# Patient Record
Sex: Male | Born: 1955
Health system: Southern US, Community
[De-identification: ages and names within clinical notes are randomized; demographics above are authoritative.]

## PROBLEM LIST (undated history)

## (undated) DIAGNOSIS — C61 Malignant neoplasm of prostate: Secondary | ICD-10-CM

## (undated) DIAGNOSIS — E785 Hyperlipidemia, unspecified: Secondary | ICD-10-CM

## (undated) DIAGNOSIS — M1990 Unspecified osteoarthritis, unspecified site: Secondary | ICD-10-CM

## (undated) DIAGNOSIS — M4800 Spinal stenosis, site unspecified: Secondary | ICD-10-CM

## (undated) DIAGNOSIS — R972 Elevated prostate specific antigen [PSA]: Secondary | ICD-10-CM

## (undated) DIAGNOSIS — E119 Type 2 diabetes mellitus without complications: Secondary | ICD-10-CM

## (undated) DIAGNOSIS — I10 Essential (primary) hypertension: Secondary | ICD-10-CM

## (undated) DIAGNOSIS — G4733 Obstructive sleep apnea (adult) (pediatric): Secondary | ICD-10-CM

## (undated) HISTORY — PX: KNEE SURGERY: SHX244

## (undated) HISTORY — DX: Unspecified osteoarthritis, unspecified site: M19.90

## (undated) HISTORY — DX: Type 2 diabetes mellitus without complications: E11.9

## (undated) HISTORY — PX: SPINAL CORD DECOMPRESSION: SHX97

## (undated) HISTORY — DX: Elevated prostate specific antigen (PSA): R97.20

## (undated) HISTORY — PX: PILONIDAL CYST EXCISION: SHX744

## (undated) HISTORY — DX: Malignant neoplasm of prostate: C61

## (undated) HISTORY — DX: Obstructive sleep apnea (adult) (pediatric): G47.33

## (undated) HISTORY — DX: Essential (primary) hypertension: I10

## (undated) HISTORY — PX: TONSILLECTOMY: SUR1361

## (undated) HISTORY — DX: Hyperlipidemia, unspecified: E78.5

---

## 2006-10-25 ENCOUNTER — Ambulatory Visit: Payer: Self-pay | Admitting: Internal Medicine

## 2006-12-05 ENCOUNTER — Encounter: Payer: Self-pay | Admitting: Internal Medicine

## 2007-01-17 ENCOUNTER — Ambulatory Visit: Payer: Self-pay | Admitting: Internal Medicine

## 2007-02-18 ENCOUNTER — Ambulatory Visit: Payer: Self-pay | Admitting: Internal Medicine

## 2007-02-28 ENCOUNTER — Ambulatory Visit (HOSPITAL_BASED_OUTPATIENT_CLINIC_OR_DEPARTMENT_OTHER): Admission: RE | Admit: 2007-02-28 | Discharge: 2007-02-28 | Payer: Self-pay | Admitting: Internal Medicine

## 2007-02-28 ENCOUNTER — Encounter: Payer: Self-pay | Admitting: Internal Medicine

## 2007-03-08 ENCOUNTER — Ambulatory Visit: Payer: Self-pay | Admitting: Pulmonary Disease

## 2007-03-13 ENCOUNTER — Ambulatory Visit: Payer: Self-pay | Admitting: Pulmonary Disease

## 2007-05-02 DIAGNOSIS — E785 Hyperlipidemia, unspecified: Secondary | ICD-10-CM

## 2007-05-02 DIAGNOSIS — I10 Essential (primary) hypertension: Secondary | ICD-10-CM | POA: Insufficient documentation

## 2007-05-02 DIAGNOSIS — G4733 Obstructive sleep apnea (adult) (pediatric): Secondary | ICD-10-CM | POA: Insufficient documentation

## 2007-05-03 ENCOUNTER — Ambulatory Visit: Payer: Self-pay | Admitting: Pulmonary Disease

## 2010-04-24 HISTORY — PX: OTHER SURGICAL HISTORY: SHX169

## 2010-07-28 ENCOUNTER — Ambulatory Visit: Payer: Commercial Indemnity | Attending: Internal Medicine | Admitting: Physical Therapy

## 2010-07-28 DIAGNOSIS — R262 Difficulty in walking, not elsewhere classified: Secondary | ICD-10-CM | POA: Insufficient documentation

## 2010-07-28 DIAGNOSIS — R5381 Other malaise: Secondary | ICD-10-CM | POA: Insufficient documentation

## 2010-07-28 DIAGNOSIS — IMO0001 Reserved for inherently not codable concepts without codable children: Secondary | ICD-10-CM | POA: Insufficient documentation

## 2010-07-28 DIAGNOSIS — M6281 Muscle weakness (generalized): Secondary | ICD-10-CM | POA: Insufficient documentation

## 2010-07-28 DIAGNOSIS — M25569 Pain in unspecified knee: Secondary | ICD-10-CM | POA: Insufficient documentation

## 2010-08-10 ENCOUNTER — Ambulatory Visit: Payer: Commercial Indemnity | Admitting: Physical Therapy

## 2010-08-12 ENCOUNTER — Ambulatory Visit: Payer: Commercial Indemnity | Admitting: Physical Therapy

## 2010-08-16 ENCOUNTER — Ambulatory Visit: Payer: Commercial Indemnity | Admitting: Physical Therapy

## 2010-08-22 ENCOUNTER — Ambulatory Visit: Payer: Commercial Indemnity | Admitting: Rehabilitation

## 2010-08-24 ENCOUNTER — Ambulatory Visit: Payer: Commercial Indemnity | Attending: Internal Medicine | Admitting: Physical Therapy

## 2010-08-24 DIAGNOSIS — M6281 Muscle weakness (generalized): Secondary | ICD-10-CM | POA: Insufficient documentation

## 2010-08-24 DIAGNOSIS — M25569 Pain in unspecified knee: Secondary | ICD-10-CM | POA: Insufficient documentation

## 2010-08-24 DIAGNOSIS — R5381 Other malaise: Secondary | ICD-10-CM | POA: Insufficient documentation

## 2010-08-24 DIAGNOSIS — R262 Difficulty in walking, not elsewhere classified: Secondary | ICD-10-CM | POA: Insufficient documentation

## 2010-08-24 DIAGNOSIS — IMO0001 Reserved for inherently not codable concepts without codable children: Secondary | ICD-10-CM | POA: Insufficient documentation

## 2010-08-26 ENCOUNTER — Ambulatory Visit: Payer: Commercial Indemnity | Admitting: Physical Therapy

## 2010-08-29 ENCOUNTER — Ambulatory Visit: Payer: Commercial Indemnity | Admitting: Physical Therapy

## 2010-08-31 ENCOUNTER — Ambulatory Visit: Payer: Commercial Indemnity | Admitting: Physical Therapy

## 2010-09-02 ENCOUNTER — Ambulatory Visit: Payer: Commercial Indemnity | Admitting: Physical Therapy

## 2010-09-05 ENCOUNTER — Ambulatory Visit: Payer: Commercial Indemnity | Admitting: Physical Therapy

## 2010-09-06 NOTE — Assessment & Plan Note (Signed)
[facility]                             PULMONARY OFFICE NOTE   NAME:Daniel [name], Daniel [name]                       MRN:          [id number]  DATE:02/18/2007                            DOB:          Aug 27, 1955    A 55 year old black male with morbid obesity, seen at Dr. Hoyle Barr  request for evaluation of dyspnea with exertion dating back several  years associated with nocturnal desaturation that was previously  documented and associated with sleep apnea by polysomnography on December 05, 2006.  Two weeks ago the patient was placed on a CPAP device at 10  cm without oxygen, is feeling like he sleeping well now and has more  energy.  He has not had any significant success in terms of weight loss  however.  Presently he says he is mostly shortness of breath when he  goes up a flight of steps or up inclines but does okay on flat surface.  He denies any orthopnea or significant leg swelling.  His medications  are reviewed with him in detail in column dated February 18, 2007.   PHYSICAL EXAMINATION:  He is a pleasant, ambulatory, obese black male in  no acute distress.  Afebrile vital signs with a weight of 406 pounds, he  is about 200 pounds over ideal body weight and up 6 pounds from previous  visit, blood pressure 130/76.  HEENT:  Unremarkable, oropharynx clear.  LUNGS:  Fields perfectly clear bilaterally to auscultation and  percussion, breath sounds are diminished.  Regular rhythm without murmur, gallop, rub.  ABDOMEN:  Soft, benign.  EXTREMITIES:  Warm without calf tenderness or cyanosis.   PFTs reviewed with patient today and are normal in terms of expiratory  flow.  He does have classic inspiratory truncation that is typical of  sleep apnea patient and also disproportionate reduction in expiratory  reserve volume typical of obesity.   IMPRESSION:  Morbid obesity is this patient primary pulmonary problem.  It results in significant nocturnal desaturation  most likely to be an  obstructive sleep apnea mechanism.  Using the trust but verify method  however I am going to recommend a continuous positive airway pressure  titration study be performed and be sure that the amount of continuous  positive airway pressure necessary to eliminate the obstruction also  eliminates all desaturation.  Otherwise we will need to place him on  nocturnal oxygen as well.  Long term I believe he will need followup by  one of our sleep apnea doctors as he does not appear to have a primary  pulmonary problem that would need to be followed here.   I spent extra time with the patient going over his pulmonary function  tests with him emphasizing that all of the effects that are seen are  related to obesity and are therefore under his control in terms of being  reversible long term with better understanding of calorie balance.  I  spent extra time also talking about calorie balance issues with him  today to help him understand that he  should target exercise that makes him short of  breath but not out of  breath at a minimum of 30 minutes daily to help improve calorie balance  and turn his long term weight loss issues around.     Charlaine Dalton. Sherene Sires, MD, [facility]  Electronically Signed    MBW/MedQ  DD: 02/18/2007  DT: 02/18/2007  Job #: [id number]   cc:   Jackie Plum, M.D.

## 2010-09-06 NOTE — Assessment & Plan Note (Signed)
[facility]                             PULMONARY OFFICE NOTE   NAME:[name], [name]                       MRN:          [id number]  DATE:10/25/2006                            DOB:          [birth year]    REFERRING PHYSICIAN:  Jackie Plum, M.D.   REASON FOR CONSULTATION:  Restrictive lung disease.   HISTORY:  This is a 55 year old black male with dyspnea on exertion  dating back years and proportionate to progressive weight gain up to a  all time high weight of around 400 pounds now.  He states he is really  only short of breath, though, when he goes up a flight of steps.  When  he tries to walk on a flat surface for any length of time his knees give  out before his breathing does.  He is also bothered by mild  hypersomnolence and has a sleep study pending.  His main complaint that  brought him to medical attention was actually leg swelling for which he  has undergone an extensive workup, which showed no evidence of clotting  or heart disease per the patient's report.  Note, there has been no  variability in terms of dyspnea with exertion or leg swelling with any  obvious triggering or alleviating factors.   PAST MEDICAL HISTORY:  The past medical history is significant for  diabetes and degenerative arthritis.   ALLERGIES:  None known.   MEDICATIONS:  The medications are reviewed in detail.  The medications  dated October 25, 2006; significant factor is that he is on no pulmonary  medications.   SOCIAL HISTORY:  The patient never smoked.  He works in Geologist, engineering.   FAMILY HISTORY:  The family history is negative for respiratory  diseases.  Positive for heart disease.  No atypia.   REVIEW OF SYSTEMS:  The patient review of systems technically on the  worksheet is negative, except as outlined above.   PHYSICAL EXAMINATION:  GENERAL APPEARANCE:  On physical examination this  is a very pleasant, morbidly obese black male in acute  distress.  VITAL SIGNS:  The patient has stable vital signs.  Weight of 400 pounds.  HEENT:  The head, eyes, ears, nose and throat are unremarkable.  Pharynx  is clear.  Dentition is intact.  Oral airway is adequate.  NECK:  The neck is supple without cervical adenopathy or tenderness.  The trachea is midline.  LUNGS:  The lung fields reveal diminished breath sounds bilaterally.  No  wheezing.  HEART:  The heart has a regular rhythm without murmur, gallop or rub.  Distant S1 and S2.  No definite increase in P2.  ABDOMEN:  The abdomen is massively obese, but it is benign with normal  excursions in the supine position.  EXTREMITIES:  The extremities reveal a little bit of calf tenderness.  No cyanosis or clubbing.  There is trace edema.   LABORATORY DATA:  The patient has not had a recent chest x-ray.  PFTs  suggest restrictive change, but are not available.   IMPRESSION:  1. Morbid obesity.  It is  not clear that this patient has a primary      pulmonary problem.  The fact that he is in short of breath when      going up two flights of steps considering he is carrying several      extra hundred pounds up those two flights of steps.  I encouraged      him along the lines of understanding that it is very unlikely      anyone, even in exceptional health, could not presently carry 200      extra pounds up a flight steps.   In that context he needs to understand calorie balance and I spent extra  time talking to him about this as well as the ideal target weight of 225  pounds, which I am sure Dr. Julio Sicks can reinforce.   In the meantime there are several major concerns about such massive  obesity.  First, of course, is the risk  of deep venous thrombosis with  pulmonary problems adding to the matter, some atherosclerotic disease  and obesity hypoventilation syndrome.  In addition the indirect affects  of reflux might be considered, but presently do not appear to apply to  this  patient.  Of immediate concern is trying to sort out whether all  the restrictive changes on the pulmonary function tests are due to  obesity. A full set of lung functions, which will include an expiratory  reserve volume should shed light on this and has been scheduled along  with a chest x-ray for six weeks.     Charlaine Dalton. Sherene Sires, MD, [facility]  Electronically Signed    MBW/MedQ  DD: 10/25/2006  DT: 10/26/2006  Job #: [id number]   cc:   Jackie Plum, M.D.

## 2010-09-06 NOTE — Assessment & Plan Note (Signed)
[facility]                             PULMONARY OFFICE NOTE   NAME:Woodside, DEWAIN                       MRN:          [id number]  DATE:03/13/2007                            DOB:          07/06/1955    SLEEP CONSULTATION   HISTORY OF PRESENT ILLNESS:  The patient is a 55 year old gentleman whom  I have been asked to see for management of obstructive sleep apnea. The  patient underwent nocturnal polysomnography in August of this year where  he was found to have severe sleep apnea with apnea hypopnea index of 88  events per hours and O2 desaturation as low as 65%. The patient was  placed on a CPAP machine in October and has been using a full face mask  with heated humidity. The patient has subsequently undergone pressure  optimization where he was found to have optimal CPAP at 18 with a large  Resiment Quattro full face mask. The patient states that he definitely  sees the difference with the CPAP and sleeps much better with increased  daytime alertness. He is currently going to bed between 11 and 2 and  getting up between 5:30 and 6:15. He feels that he sleeps much better.   PAST MEDICAL HISTORY:  1. Hypertension.  2. Diabetes.  3. Dyslipidemia.  4. History of sleep apnea as stated above.   CURRENT MEDICATIONS:  1. Hyzaar 100/25 daily.  2. Avandamet 07/998 daily.  3. Furosemide 20 mg daily.  4. Simvastatin 40 mg daily.  5. Glipizide ER 5 mg daily.  6. Aleve p.r.n.   ALLERGIES:  The patient has no known drug allergies.   SOCIAL HISTORY:  He is married and has children. He has a history of  smoking two packs per week for 5-10 years. He has not smoked in 20  years.   FAMILY HISTORY:  Is remarkable for his mother having a myocardial  infarction/coronary disease. Otherwise, noncontributory in first-degree  relatives.   REVIEW OF SYSTEMS:  As per History of Present Illness. Also, see the  patient intake form documented in the chart.   PHYSICAL EXAMINATION:  In general, he is a morbidly obese male in no  acute distress. Blood pressure 138/74, pulse 73, temperature 98.7,  weight 410 pounds. He is 6 feet, 2 inches tall. O2 saturation on room  air is 97%.  HEENT: Pupils equal, round and reactive to light and accommodation.  Extraocular muscles are intact. Nares: Are patent with turbinate  hypertrophy. Oropharynx does show elongation of the soft palate and  uvula.  NECK: Supple without JVD or lymphadenopathy. There is no palpable  thyromegaly.  CHEST: Is totally clear.  CARDIAC: Reveals regular rate and rhythm. No murmurs, rubs or gallops.  ABDOMEN: Soft and nontender with good bowel sounds.  GENITAL, RECTAL, BREASTS: Was not done and not indicated.  LOWER EXTREMITIES: Shows 1+ edema bilaterally. Pulses are intact  distally.  NEUROLOGIC: Alert and oriented and without obvious motor deficits.   IMPRESSION:  Severe obstructive sleep apnea. The patient has been  started on CPAP and seems to have adjusted quite well. He has  already  seen a difference even at subtherapeutic pressures. At this point in  time, I think we need to get him up to his optimal pressure of 18 cm  slowly and see how he responds. Also, since the patient has had  significant O2 desaturation during his titration, we need to make sure  that he does not need ongoing oxygen with his CPAP.   PLAN:  1. Increase CPAP to 14 cm for three weeks followed by 18 cm for the      next three weeks. He will see me at the end of that time.  2. At the next visit, will order overnight oxymetry on 18-cm of CPAP      with room air.  3. The patient is encouraged to work aggressively on weight loss.     Barbaraann Share, MD,FCCP  Electronically Signed    KMC/MedQ  DD: 03/28/2007  DT: 03/28/2007  Job #: [id number]   cc:   Charlaine Dalton. Sherene Sires, MD, FCCP

## 2010-09-06 NOTE — Procedures (Signed)
NAME:  Daniel [name], Daniel [name]              ACCOUNT NO.:  [id number]   MEDICAL RECORD NO.:  [id number]          PATIENT TYPE:  OUT   LOCATION:  SLEEP CENTER                 FACILITY:  [facility]   PHYSICIAN:  Barbaraann Share, MD,FCCPDATE OF BIRTH:  12-14-54   DATE OF STUDY:  02/28/2007                            NOCTURNAL POLYSOMNOGRAM   REFERRING PHYSICIAN:  Charlaine Dalton. Wert, MD, FCCP   INDICATION FOR STUDY:  Hypersomnia with sleep apnea.  The patient has  been diagnosed with severe obstructive sleep apnea and returns for  pressure optimization.   EPWORTH SLEEPINESS SCORE:  6   MEDICATIONS:   SLEEP ARCHITECTURE:  The patient had a total sleep time of 310 minutes  with no slow wave sleep and only 58 minutes of REM.  Sleep onset latency  was very rapid and REM onset was normal.  Sleep efficiency was decreased  at 79%.   RESPIRATORY DATA:  The patient underwent CPAP titration with a large  ResMed Quattro full face mask.  The patient was placed on CPAP at the  beginning of the study and ultimately increased to a pressure of 19-  cmH2O.  At this level, there were significant central apneas noted and  therefore, I would recommend a treatment pressure of 18-cm.  Tolerance  appeared to be good.   OXYGEN DATA:  There was transient O2 desaturation as low as 78% prior to  optimal CPAP.   CARDIAC DATA:  No clinically significant arrhythmias were noted.   MOVEMENT-PARASOMNIA:  None.   IMPRESSIONS-RECOMMENDATIONS:  Good control of previous documented  obstructive sleep apnea with a CPAP pressure of 18-cmH2O delivered by a  large ResMed Quattro full face mask.  The patient should also be  encouraged to work aggressively on weight loss.     Barbaraann Share, MD,FCCP  Diplomate, American Board of Sleep  Medicine  Electronically Signed    KMC/MEDQ  D:  03/09/2007 13:29:18  T:  03/10/2007 18:84:16  Job:  [id number]

## 2010-09-06 NOTE — Assessment & Plan Note (Signed)
[facility]                             PULMONARY OFFICE NOTE   NAME:Daniel [name], Daniel [name]                       MRN:          [id number]  DATE:01/17/2007                            DOB:          Jun 23, 1955    HISTORY:  55 year old black male with morbid obesity and unexplained  dyspnea who is scheduled for followup PFTs in October but in the  meantime comes in with an overnight sleep study indicating that he has  severe sleep apnea and wanted to be started on CPAP.  He does wake up  sometimes with headaches, frequently not feeling rested, but he denies  any excessive daytime hypersomnolence.   PHYSICAL EXAMINATION:  GENERAL:  He is an obese, ambulatory black male  in no acute distress.  VITAL SIGNS:  Stable.  HEENT:  Unremarkable.  Oropharynx clear.  LUNGS:  Lung fields are completely clear bilaterally to auscultation and  percussion, although breath sounds are diminished.  CARDIOVASCULAR:  Regular rate and rhythm without murmurs, gallops, or  rubs.  ABDOMEN:  Soft, benign.  EXTREMITIES:  Without calf tenderness, cyanosis, clubbing, or edema.   Sleep study was reviewed from December 05, 2006 indicating an AHI of 87.8  with no evidence of significant daytime sleepiness but significant  desaturations that occurred with a saturation as low as 65% during his  obstructive episodes.   IMPRESSION:  Severe sleep apnea with also nocturnal desaturation, all  related to morbid obesity.  I explained the pathophysiology of sleep  apnea to him and recommended that he be tried on 10 cm continuous  positive airway pressure between now and his next visit.  At that time,  he will need to be restudied for continuous positive airway pressure  titration and to make sure that the desaturation that was documented has  resolved without the need for supplemental oxygen.  Much of this, of  course, will depend on his progress with weight loss in the meantime.     Charlaine Dalton.  Sherene Sires, MD, [facility]  Electronically Signed    MBW/MedQ  DD: 01/17/2007  DT: 01/18/2007  Job #: [id number]   cc:   Jackie Plum, M.D.

## 2010-09-07 ENCOUNTER — Ambulatory Visit: Payer: Commercial Indemnity | Admitting: Physical Therapy

## 2010-09-09 ENCOUNTER — Ambulatory Visit: Payer: Commercial Indemnity | Admitting: Rehabilitation

## 2012-05-25 DIAGNOSIS — C61 Malignant neoplasm of prostate: Secondary | ICD-10-CM

## 2012-05-25 HISTORY — DX: Malignant neoplasm of prostate: C61

## 2012-08-29 DIAGNOSIS — R972 Elevated prostate specific antigen [PSA]: Secondary | ICD-10-CM

## 2012-08-29 HISTORY — DX: Elevated prostate specific antigen (PSA): R97.20

## 2012-08-29 HISTORY — PX: PROSTATE BIOPSY: SHX241

## 2012-09-25 NOTE — Progress Notes (Signed)
GU Location of Tumor / Histology:Adenocarcinoma of prostate  If Prostate Cancer, Gleason Score is (3 + 3) and PSA is (7.22)  Patient presented with elevated PSA on , completed anti-biotic therapy then had biopsy with abnormal cells and then second biopsy revealed abnormal cells and after watchful waiting had MRI.  Biopsies of prostate (if applicable) revealed:right lateral apex, gleason 6 with less than 5% of one core  Past/Anticipated interventions by urology, if WUJ:[id number] biopsy on 12/12/11, 06/04/12 and 08/29/12, MR prostate 07/26/12 reveals right apical zone, small left mid gland lesion and left base.Patient to return to [facility] after Radiation Consultation to discuss decision of treatment.  Past/Anticipated interventions by medical oncology, if GNF:AOZH  Weight changes, if any:No  Bowel/Bladder complaints, if any:No  Nausea/Vomiting, if any:No  Pain issues, if YQM:VHQI  SAFETY ISSUES:  Prior radiation? No  Pacemaker/ICD? No  Possible current pregnancy? No  Is the patient on methotrexate? No  Current Complaints / other details Prostate volume 40.97 Patient here with wife for consultation.has 2 children.Works for city of [employer].

## 2012-09-26 ENCOUNTER — Telehealth: Payer: Self-pay | Admitting: *Deleted

## 2012-09-26 ENCOUNTER — Ambulatory Visit
Admission: RE | Admit: 2012-09-26 | Discharge: 2012-09-26 | Disposition: A | Discharge status: Home / Self Care | Payer: Commercial Indemnity | Source: Ambulatory Visit | Attending: Radiation Oncology | Admitting: Radiation Oncology

## 2012-09-26 ENCOUNTER — Encounter: Payer: Self-pay | Admitting: Radiation Oncology

## 2012-09-26 VITALS — BP 127/76 | HR 80 | Temp 97.7°F | Ht 74.0 in | Wt 353.6 lb

## 2012-09-26 DIAGNOSIS — Z7982 Long term (current) use of aspirin: Secondary | ICD-10-CM | POA: Insufficient documentation

## 2012-09-26 DIAGNOSIS — E669 Obesity, unspecified: Secondary | ICD-10-CM | POA: Insufficient documentation

## 2012-09-26 DIAGNOSIS — E119 Type 2 diabetes mellitus without complications: Secondary | ICD-10-CM | POA: Insufficient documentation

## 2012-09-26 DIAGNOSIS — C61 Malignant neoplasm of prostate: Secondary | ICD-10-CM

## 2012-09-26 DIAGNOSIS — G4733 Obstructive sleep apnea (adult) (pediatric): Secondary | ICD-10-CM | POA: Insufficient documentation

## 2012-09-26 DIAGNOSIS — I10 Essential (primary) hypertension: Secondary | ICD-10-CM | POA: Insufficient documentation

## 2012-09-26 DIAGNOSIS — Z87891 Personal history of nicotine dependence: Secondary | ICD-10-CM | POA: Insufficient documentation

## 2012-09-26 DIAGNOSIS — E785 Hyperlipidemia, unspecified: Secondary | ICD-10-CM | POA: Insufficient documentation

## 2012-09-26 NOTE — Progress Notes (Signed)
Please see the Nurse Progress Note in the MD Initial Consult Encounter for this patient. 

## 2012-09-26 NOTE — Telephone Encounter (Signed)
Called patient to inform of pre-seed appt. For 10-01-12 at 2:30 pm, spoke with patient and he is aware of this appt.

## 2012-09-26 NOTE — Addendum Note (Signed)
Encounter addended by: Maryln Gottron, MD on: 09/26/2012  1:12 PM<BR>     Documentation filed: Notes Section

## 2012-09-26 NOTE — Progress Notes (Addendum)
[facility] Radiation Oncology NEW PATIENT EVALUATION  Name: Daniel [name] MRN: [id number]  Date:   09/26/2012           DOB: April 16, 1956  Status: outpatient   CC: No primary provider on file.  Lindaann Slough, MD    REFERRING PHYSICIAN: Lindaann Slough, MD   DIAGNOSIS: Stage TI C. favorable risk adenocarcinoma prostate   HISTORY OF PRESENT ILLNESS:  Daniel [name] is a 57 y.o. male who is seen today for the courtesy Dr. Brunilda Payor for a possible radiation therapy in the management of his stage TI C. adenocarcinoma prostate. He presented with a PSA of 11.1 and underwent ultrasound-guided biopsies by Dr. Brunilda Payor on 12/12/2011 showing atypical glands within the right lateral base, and also left lateral apex. 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 He elected for close surveillance with a repeat PSA of 6.95 with repeat biopsies on 06/04/2012 showing since 6 (3+3) along the right lateral apex. A staging MRI scan at [facility] showed a 3.0 x 2.9 x 2.3 cm area of right apical peripheral zone involvement. There was capsular retraction along the left seminal vesicle felt to be secondary to post inflammatory scarring.  More recently, his PSA rose to 7.22 with repeat biopsies on 08/29/2012 showing 2 areas of Gleason 6 (3+3) involving 5% of one core from the left lateral apex and less than 5% of one core from the right lateral apex. He is interested in pursuing potentially curative therapy. He declines surgery. He is doing reasonably well from a GU and GI standpoint. His I PSS score is 11. He does have erectile dysfunction. His prostate volume from February of this year was 41 cc. PREVIOUS RADIATION THERAPY: No   PAST MEDICAL HISTORY:  has a past medical history of Prostate cancer; Diabetes mellitus without complication; Hypertension; Hyperlipidemia; Obstructive sleep apnea; Elevated PSA (08/29/2012); and Arthritis.     PAST SURGICAL HISTORY:  Past Surgical History  Procedure  Laterality Date  . Prostate biopsy Bilateral 08/29/2012    gleason 3+3=6  Dr.Marc Nesi  . Tonsillectomy      as child age 83 or 58  . Knee surgery      torn meniscus  . Pilonidal cyst excision      lower spine age 56  . Spinal injection  2012    spinal stenosis     FAMILY HISTORY: His father died from liver/kidney failure at 70. His mother died of a heart attack in her mid 70s. A maternal uncle was diagnosed with prostate cancer in his 16s.  SOCIAL HISTORY:  reports that he quit smoking about 15 years ago. His smoking use included Cigarettes, Pipe, and Cigars. He has a 5 pack-year smoking history. He does not have any smokeless tobacco history on file. He reports that  drinks alcohol. Married, 74 year old son and 68 year old daughter. 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   ALLERGIES: Review of patient's allergies indicates no known allergies.   MEDICATIONS:  Current Outpatient Prescriptions  Medication Sig Dispense Refill  . aspirin (ASPIRIN EC) 81 MG EC tablet Take 81 mg by mouth daily. Swallow whole.      . Canagliflozin (INVOKANA) 300 MG TABS Take by mouth.      . Colesevelam HCl (WELCHOL) 3.75 G PACK Take 3.75 g by mouth.      . furosemide (LASIX) 20 MG tablet Take 20 mg by mouth daily.       Marland Kitchen losartan-hydrochlorothiazide (HYZAAR) 100-25 MG  per tablet Take 1 tablet by mouth daily.      . metFORMIN (GLUCOPHAGE) 500 MG tablet Take 1,000 mg by mouth 2 (two) times daily with a meal. 2  Tablets in the morning and 1 tablet in th pm.      . naproxen sodium (ANAPROX) 220 MG tablet Take 220 mg by mouth 2 (two) times daily with a meal.       No current facility-administered medications for this encounter.     REVIEW OF SYSTEMS:  Pertinent items are noted in HPI.    PHYSICAL EXAM:  height is 6\' 2"  (1.88 m) and weight is 353 lb 9.6 oz (160.392 kg). His temperature is 97.7 F (36.5 C). His blood pressure is 127/76 and his pulse is 80.   Alert and oriented obese  57 year old African American male appearing his stated age. Head and neck examination: Grossly unremarkable. Nodes: Without palpable cervical or supraclavicular lymphadenopathy. Chest: Lungs clear. Back: Without spinal or CVA tenderness. Heart: Regular rate and rhythm. Abdomen: Obese without hepatomegaly. Genitalia: Unremarkable to inspection. Rectal: I am only able to palpate the inferior one half of his gland which is without focal induration or nodularity. Extremities: Trace ankle edema.   LABORATORY DATA:  No results found for this basename: WBC, HGB, HCT, MCV, PLT   No results found for this basename: NA, K, CL, CO2   No results found for this basename: ALT, AST, GGT, ALKPHOS, BILITOT   PSA 7.22 from 08/29/2012    IMPRESSION: Stage TI C. favorable risk adenocarcinoma prostate. I explained to the patient and his wife that his prognosis is related to his stage, PSA level, and Gleason score. He has favorable risk disease. We discussed other prognostic factors which include PSA doubling time and disease volume. These also appear to be favorable. We discussed management options which include surgery versus continued close surveillance, and radiation therapy. Radiation therapy options include seed implantation alone or external beam/IMRT. We discussed the potential acute and late toxicities of radiation therapy. After lengthy discussion he is most interested in seed implantation which I think would be a reasonable choice for him. Will have him return for his CT arch study and then get him scheduled for seed implantation with Dr. Brunilda Payor. Consent is signed today.   PLAN: As discussed above. He was given literature for review. I spent 60 minutes minutes face to face with the patient and more than 50% of that time was spent in counseling and/or coordination of care.

## 2012-09-26 NOTE — Addendum Note (Signed)
Encounter addended by: Maryln Gottron, MD on: 09/26/2012  1:19 PM<BR>     Documentation filed: Notes Section

## 2012-09-30 ENCOUNTER — Telehealth: Payer: Self-pay | Admitting: *Deleted

## 2012-09-30 NOTE — Telephone Encounter (Signed)
Called patient to remind of appt. For 10-01-12, lvm for a return call

## 2012-10-01 ENCOUNTER — Other Ambulatory Visit: Payer: Self-pay | Admitting: Urology

## 2012-10-01 ENCOUNTER — Ambulatory Visit
Admission: RE | Admit: 2012-10-01 | Discharge: 2012-10-01 | Disposition: A | Discharge status: Home / Self Care | Payer: Managed Care, Other (non HMO) | Source: Ambulatory Visit | Attending: Radiation Oncology | Admitting: Radiation Oncology

## 2012-10-01 DIAGNOSIS — C61 Malignant neoplasm of prostate: Secondary | ICD-10-CM | POA: Insufficient documentation

## 2012-10-01 NOTE — Progress Notes (Signed)
   Complex simulation/treatment planning note: The patient was taken to the CT simulator. His pelvis was scanned. The CT data set was sent to the treatment planning computer for contouring of his prostate. I perform a CT arch study. The arch is open with minimal overlap anteriorly. Prostate volume is 41.8 cc with a prostatic length of 4.8 cm. I'm prescribing 14,500 cGy utilizing I-125 seeds. He is to be implanted with [employer] system.

## 2012-10-02 ENCOUNTER — Telehealth: Payer: Self-pay | Admitting: *Deleted

## 2012-10-02 NOTE — Telephone Encounter (Signed)
CALLED PATIENT TO INFORM THAT HIS IMPLANT WILL BE DONE IN THE MAIN OR AT [facility], SPOKE WITH PATIENT AND HE IS AWARE OF THIS

## 2012-10-09 ENCOUNTER — Other Ambulatory Visit: Payer: Self-pay | Admitting: Radiation Oncology

## 2012-10-09 ENCOUNTER — Telehealth: Payer: Self-pay | Admitting: *Deleted

## 2012-10-09 DIAGNOSIS — C61 Malignant neoplasm of prostate: Secondary | ICD-10-CM

## 2012-10-09 NOTE — Telephone Encounter (Signed)
CALLED PATIENT TO ASK QUESTION, LVM FOR A RETURN CALL 

## 2012-10-16 ENCOUNTER — Ambulatory Visit (HOSPITAL_COMMUNITY)
Admission: RE | Admit: 2012-10-16 | Discharge: 2012-10-16 | Disposition: A | Discharge status: Home / Self Care | Payer: Commercial Indemnity | Source: Ambulatory Visit | Attending: Radiation Oncology | Admitting: Radiation Oncology

## 2012-10-16 DIAGNOSIS — IMO0002 Reserved for concepts with insufficient information to code with codable children: Secondary | ICD-10-CM | POA: Insufficient documentation

## 2012-10-16 DIAGNOSIS — C61 Malignant neoplasm of prostate: Secondary | ICD-10-CM | POA: Insufficient documentation

## 2012-10-16 DIAGNOSIS — Z01818 Encounter for other preprocedural examination: Secondary | ICD-10-CM | POA: Insufficient documentation

## 2012-10-16 DIAGNOSIS — E119 Type 2 diabetes mellitus without complications: Secondary | ICD-10-CM | POA: Insufficient documentation

## 2012-10-16 DIAGNOSIS — I10 Essential (primary) hypertension: Secondary | ICD-10-CM | POA: Insufficient documentation

## 2012-10-16 DIAGNOSIS — Z01810 Encounter for preprocedural cardiovascular examination: Secondary | ICD-10-CM | POA: Insufficient documentation

## 2012-11-08 ENCOUNTER — Encounter (HOSPITAL_COMMUNITY): Payer: Self-pay | Admitting: Pharmacy Technician

## 2012-11-13 ENCOUNTER — Encounter (HOSPITAL_COMMUNITY)
Admission: RE | Admit: 2012-11-13 | Discharge: 2012-11-13 | Disposition: A | Discharge status: Home / Self Care | Payer: Managed Care, Other (non HMO) | Source: Ambulatory Visit | Attending: Urology | Admitting: Urology

## 2012-11-13 ENCOUNTER — Encounter (HOSPITAL_COMMUNITY): Payer: Self-pay

## 2012-11-13 HISTORY — DX: Spinal stenosis, site unspecified: M48.00

## 2012-11-13 LAB — CBC
MCH: 30.6 pg (ref 26.0–34.0)
MCV: 90.8 fL (ref 78.0–100.0)
Platelets: 164 10*3/uL (ref 150–400)
RBC: 5 MIL/uL (ref 4.22–5.81)
RDW: 13.1 % (ref 11.5–15.5)

## 2012-11-13 LAB — PROTIME-INR
INR: 0.99 (ref 0.00–1.49)
Prothrombin Time: 12.9 seconds (ref 11.6–15.2)

## 2012-11-13 LAB — COMPREHENSIVE METABOLIC PANEL
AST: 39 U/L — ABNORMAL HIGH (ref 0–37)
CO2: 29 mEq/L (ref 19–32)
Calcium: 9.8 mg/dL (ref 8.4–10.5)
Creatinine, Ser: 0.7 mg/dL (ref 0.50–1.35)
GFR calc non Af Amer: >90 mL/min (ref >90)

## 2012-11-13 NOTE — Patient Instructions (Addendum)
19 Daniel [name]  11/13/2012   Your procedure is scheduled on: 11/19/12  Report to [facility] at 5:15AM.  Call this number if you have problems the morning of surgery 336-: [phone]   Remember: please do not take any diabetic medicine on day of surgery    Do not eat food or drink liquids After Midnight.      Do not wear jewelry, make-up or nail polish.  Do not wear lotions, powders, or perfumes. You may wear deodorant.  Do not shave 48 hours prior to surgery. Men may shave face and neck.  Do not bring valuables to the hospital.  Contacts, dentures or bridgework may not be worn into surgery.    Patients discharged the day of surgery will not be allowed to drive home.  Name and phone number of your driver:[name] (wife) [phone]   [name], RN  pre op nurse call if needed [phone]    FAILURE TO FOLLOW THESE INSTRUCTIONS MAY RESULT IN CANCELLATION OF YOUR SURGERY   Patient Signature: ___________________________________________

## 2012-11-13 NOTE — Progress Notes (Signed)
EKG 10/16/12 on chart- pt refused to have another one. Chest x-ray 10/16/12 on EPIC- pt refused to have another one.

## 2012-11-18 ENCOUNTER — Telehealth: Payer: Self-pay | Admitting: *Deleted

## 2012-11-18 NOTE — Telephone Encounter (Signed)
Called patient to remind of procedure for 11-19-12, confirmed procedure for 11-19-12 with patient.

## 2012-11-18 NOTE — H&P (Signed)
  History and Physical  Chief Complaint: Prostate cancer  History of Present Illness: Daniel [name] has  prostate cancer Gleason 6 diagnosed in August 2013 in 5% of one core at the right apex.  He elected to be on active surveillance.  Repeat biopsy showed 2 cores positive for adenocarcinoma.  He changed his mind about active surveillance.  He saw Dr Dayton Scrape in consultation and he chose to have seeds implantation.  He is now scheduled for the procedure.  Past Medical History  Diagnosis Date  . Diabetes mellitus without complication   . Hypertension   . Hyperlipidemia   . Obstructive sleep apnea     CPAP  . Elevated PSA 08/29/2012    7.22  . Arthritis   . Prostate cancer Feb 2014  . Spinal stenosis    Past Surgical History  Procedure Laterality Date  . Prostate biopsy Bilateral 08/29/2012    gleason 3+3=6  Dr.Draiden Mirsky  . Tonsillectomy      as child age 61 or 13  . Knee surgery      torn meniscus  . Pilonidal cyst excision      lower spine age 89  . Spinal injection  2012    spinal stenosis    Medications: Aleve,Bydureon, Ecotrin, Furosemide, Invokana, Losartan, Metformin, Welchol. Allergies: No Known Allergies  No family history on file. Social History:  reports that he quit smoking about 15 years ago. His smoking use included Cigarettes, Pipe, and Cigars. He has a 5 pack-year smoking history. He has never used smokeless tobacco. He reports that  drinks alcohol. He reports that he does not use illicit drugs.  ROS: All systems are reviewed and negative except as noted.   Physical Exam:  Vital signs in last 24 hours:   HEENT: Normal.  No cervical adenopathy. Cardiovascular: Skin warm; not flushed Respiratory: Breaths quiet; no shortness of breath Abdomen: No masses Neurological: Normal sensation to touch Musculoskeletal: Normal motor function arms and legs Lymphatics: No inguinal adenopathy Skin: No rashes Genitourinary: Penis is normal.  Scrotum is normal in appearance.   Testicles are normal. Rectal: Sphincter tone is normal.  Prostate is enlarged 40 gm.  No nodules.  Seminal vesicles are not palpable.  Laboratory Data:  No results found for this or any previous visit (from the past 24 hour(s)). No results found for this or any previous visit (from the past 240 hour(s)). Creatinine:  Recent Labs  11/13/12 1230  CREATININE 0.70    Xrays: See report/chart   Impression/Assessment:  Adenocarcinoma of prostate stage T1C  Plan:  I125 seeds implantation.  Daniel [name] 11/18/2012, 5:37 PM

## 2012-11-19 ENCOUNTER — Encounter (HOSPITAL_COMMUNITY)
Admission: RE | Disposition: A | Discharge status: Home / Self Care | Payer: Self-pay | Source: Ambulatory Visit | Attending: Urology

## 2012-11-19 ENCOUNTER — Ambulatory Visit (HOSPITAL_COMMUNITY): Payer: Managed Care, Other (non HMO)

## 2012-11-19 ENCOUNTER — Ambulatory Visit (HOSPITAL_COMMUNITY)
Admission: RE | Admit: 2012-11-19 | Discharge: 2012-11-19 | Disposition: A | Discharge status: Home / Self Care | Payer: Managed Care, Other (non HMO) | Source: Ambulatory Visit | Attending: Urology | Admitting: Urology

## 2012-11-19 ENCOUNTER — Encounter: Payer: Self-pay | Admitting: Radiation Oncology

## 2012-11-19 ENCOUNTER — Encounter (HOSPITAL_COMMUNITY): Payer: Self-pay | Admitting: *Deleted

## 2012-11-19 ENCOUNTER — Encounter (HOSPITAL_COMMUNITY): Payer: Self-pay | Admitting: Anesthesiology

## 2012-11-19 ENCOUNTER — Ambulatory Visit (HOSPITAL_COMMUNITY): Payer: Managed Care, Other (non HMO) | Admitting: Anesthesiology

## 2012-11-19 DIAGNOSIS — E119 Type 2 diabetes mellitus without complications: Secondary | ICD-10-CM | POA: Insufficient documentation

## 2012-11-19 DIAGNOSIS — C61 Malignant neoplasm of prostate: Secondary | ICD-10-CM | POA: Insufficient documentation

## 2012-11-19 DIAGNOSIS — E785 Hyperlipidemia, unspecified: Secondary | ICD-10-CM | POA: Insufficient documentation

## 2012-11-19 DIAGNOSIS — G4733 Obstructive sleep apnea (adult) (pediatric): Secondary | ICD-10-CM | POA: Insufficient documentation

## 2012-11-19 DIAGNOSIS — I10 Essential (primary) hypertension: Secondary | ICD-10-CM | POA: Insufficient documentation

## 2012-11-19 HISTORY — PX: RADIOACTIVE SEED IMPLANT: SHX5150

## 2012-11-19 SURGERY — INSERTION, RADIATION SOURCE, PROSTATE
Anesthesia: General | Wound class: Contaminated

## 2012-11-19 MED ORDER — FENTANYL CITRATE 0.05 MG/ML IJ SOLN
INTRAMUSCULAR | Status: AC
  Filled 2012-11-19: qty 2

## 2012-11-19 MED ORDER — HYDROCODONE-ACETAMINOPHEN 5-325 MG PO TABS: 1.0000 | ORAL_TABLET | Freq: Four times a day (QID) | ORAL | Status: DC | PRN

## 2012-11-19 MED ORDER — CIPROFLOXACIN IN D5W 400 MG/200ML IV SOLN
INTRAVENOUS | Status: AC
  Filled 2012-11-19: qty 200

## 2012-11-19 MED ORDER — FENTANYL CITRATE 0.05 MG/ML IJ SOLN
25.0000 ug | INTRAMUSCULAR | Status: DC | PRN
  Administered 2012-11-19 (×3): 50 ug via INTRAVENOUS

## 2012-11-19 MED ORDER — MEPERIDINE HCL 50 MG/ML IJ SOLN: 6.2500 mg | INTRAMUSCULAR | Status: DC | PRN

## 2012-11-19 MED ORDER — FENTANYL CITRATE 0.05 MG/ML IJ SOLN
INTRAMUSCULAR | Status: DC | PRN
  Administered 2012-11-19: 100 ug via INTRAVENOUS
  Administered 2012-11-19 (×2): 50 ug via INTRAVENOUS

## 2012-11-19 MED ORDER — SUCCINYLCHOLINE CHLORIDE 20 MG/ML IJ SOLN
INTRAMUSCULAR | Status: DC | PRN
  Administered 2012-11-19 (×2): 100 mg via INTRAVENOUS

## 2012-11-19 MED ORDER — PROPOFOL 10 MG/ML IV BOLUS
INTRAVENOUS | Status: DC | PRN
  Administered 2012-11-19: 300 mg via INTRAVENOUS
  Administered 2012-11-19: 100 mg via INTRAVENOUS

## 2012-11-19 MED ORDER — NEOSTIGMINE METHYLSULFATE 1 MG/ML IJ SOLN
INTRAMUSCULAR | Status: DC | PRN
  Administered 2012-11-19: 5 mg via INTRAVENOUS

## 2012-11-19 MED ORDER — IOHEXOL 300 MG/ML  SOLN
INTRAMUSCULAR | Status: DC | PRN
  Administered 2012-11-19: 7 mL

## 2012-11-19 MED ORDER — LIDOCAINE HCL (CARDIAC) 20 MG/ML IV SOLN
INTRAVENOUS | Status: DC | PRN
  Administered 2012-11-19: 100 mg via INTRAVENOUS

## 2012-11-19 MED ORDER — MIDAZOLAM HCL 5 MG/5ML IJ SOLN
INTRAMUSCULAR | Status: DC | PRN
  Administered 2012-11-19: 2 mg via INTRAVENOUS

## 2012-11-19 MED ORDER — IOHEXOL 300 MG/ML  SOLN
INTRAMUSCULAR | Status: AC
  Filled 2012-11-19: qty 1

## 2012-11-19 MED ORDER — LACTATED RINGERS IV SOLN
INTRAVENOUS | Status: DC | PRN
  Administered 2012-11-19 (×2): via INTRAVENOUS

## 2012-11-19 MED ORDER — FLEET ENEMA 7-19 GM/118ML RE ENEM: 1.0000 | ENEMA | Freq: Once | RECTAL | Status: DC

## 2012-11-19 MED ORDER — LACTATED RINGERS IV SOLN: INTRAVENOUS | Status: DC

## 2012-11-19 MED ORDER — PROMETHAZINE HCL 25 MG/ML IJ SOLN: 6.2500 mg | INTRAMUSCULAR | Status: DC | PRN

## 2012-11-19 MED ORDER — ONDANSETRON HCL 4 MG/2ML IJ SOLN
INTRAMUSCULAR | Status: DC | PRN
  Administered 2012-11-19: 4 mg via INTRAVENOUS

## 2012-11-19 MED ORDER — STERILE WATER FOR IRRIGATION IR SOLN
Status: DC | PRN
  Administered 2012-11-19: 3000 mL via INTRAVESICAL

## 2012-11-19 MED ORDER — GLYCOPYRROLATE 0.2 MG/ML IJ SOLN
INTRAMUSCULAR | Status: DC | PRN
  Administered 2012-11-19: .8 mg via INTRAVENOUS

## 2012-11-19 MED ORDER — CISATRACURIUM BESYLATE (PF) 10 MG/5ML IV SOLN
INTRAVENOUS | Status: DC | PRN
  Administered 2012-11-19: 4 mg via INTRAVENOUS
  Administered 2012-11-19: 8 mg via INTRAVENOUS
  Administered 2012-11-19: 2 mg via INTRAVENOUS

## 2012-11-19 MED ORDER — CIPROFLOXACIN HCL 500 MG PO TABS: 500.0000 mg | ORAL_TABLET | Freq: Two times a day (BID) | ORAL | Status: DC

## 2012-11-19 MED ORDER — CIPROFLOXACIN IN D5W 400 MG/200ML IV SOLN
400.0000 mg | INTRAVENOUS | Status: AC
  Administered 2012-11-19: 400 mg via INTRAVENOUS

## 2012-11-19 SURGICAL SUPPLY — 18 items
BAG URINE DRAINAGE (UROLOGICAL SUPPLIES) IMPLANT
BLADE SURG ROTATE 9660 (MISCELLANEOUS) IMPLANT
CATH FOLEY 2WAY SLVR  5CC 16FR (CATHETERS) ×1
CATH FOLEY 2WAY SLVR 5CC 16FR (CATHETERS) ×1 IMPLANT
CATH ROBINSON RED A/P 20FR (CATHETERS) ×2 IMPLANT
CLOTH BEACON ORANGE TIMEOUT ST (SAFETY) ×2 IMPLANT
DRAPE BACK TABLE (DRAPES) ×2 IMPLANT
DRSG TEGADERM 4X4.75 (GAUZE/BANDAGES/DRESSINGS) ×2 IMPLANT
DRSG TEGADERM 8X12 (GAUZE/BANDAGES/DRESSINGS) ×2 IMPLANT
GLOVE BIOGEL M 7.0 STRL (GLOVE) ×6 IMPLANT
GOWN STRL NON-REIN LRG LVL3 (GOWN DISPOSABLE) ×2 IMPLANT
HOLDER FOLEY CATH W/STRAP (MISCELLANEOUS) ×2 IMPLANT
PACK CYSTO (CUSTOM PROCEDURE TRAY) ×2 IMPLANT
SYRINGE 10CC LL (SYRINGE) ×4 IMPLANT
TRAY FOLEY BAG SILVER LF 16FR (CATHETERS) ×2 IMPLANT
UNDERPAD 30X30 INCONTINENT (UNDERPADS AND DIAPERS) ×4 IMPLANT
WATER STERILE IRR 1000ML UROMA (IV SOLUTION) IMPLANT
select seed i-125 ×114 IMPLANT

## 2012-11-19 NOTE — Anesthesia Procedure Notes (Addendum)
Procedure Name: Intubation Date/Time: 11/19/2012 7:53 AM Performed by: Leroy Libman L Patient Re-evaluated:Patient Re-evaluated prior to inductionOxygen Delivery Method: Circle system utilized Preoxygenation: Pre-oxygenation with 100% oxygen Intubation Type: IV induction Ventilation: Mask ventilation without difficulty and Oral airway inserted - appropriate to patient size Laryngoscope Size: Mac and 4 Grade View: Grade III Tube type: Oral Tube size: 8.0 mm Number of attempts: 3 Airway Equipment and Method: Bougie stylet Placement Confirmation: ETT inserted through vocal cords under direct vision,  breath sounds checked- equal and bilateral and positive ETCO2 Secured at: 22 cm Tube secured with: Tape Dental Injury: Teeth and Oropharynx as per pre-operative assessment  Difficulty Due To: Difficult Airway- due to reduced neck mobility and Difficult Airway- due to anterior larynx Comments: Attempted with Hyacinth Meeker 3 and only able to see arytenoids.  Mac 4 used by Dr. Acey Lav, slight rightward tracheal pressure and bougie

## 2012-11-19 NOTE — Op Note (Signed)
Daniel [name] is a 57 y.o.   11/19/2012  General  Preop diagnosis: Adenocarcinoma of prostate stage TI C.  Postop diagnosis: Same  Procedure done: I-125 seeds implantation, cystoscopy  Surgeon: Wendie Simmer. Athaliah Baumbach and Dr. Chipper Herb  Anesthesia: General  Indication: Patient is a 57 years old male who was diagnosed with prostate cancer Gleason 6 in August 2013. He was on active surveillance. Repeat biopsy was positive for adenocarcinoma Gleason 6 at the apex. He then elected to have definitive therapy. All options were discussed with him. He saw Dr. Dayton Scrape in consultation and he chose to have seeds implantation. He is scheduled today for the procedure  Procedure: Patient was identified by his wrist band and proper timeout was taken.  Under general anesthesia patient was prepped and draped and placed in the dorsolithotomy position. A Foley catheter was inserted in the bladder. Ultrasound planning was then done by Dr. Dayton Scrape. When planning was completed, under ultrasound guidance and with the Nucletron a total of 57 seeds were implanted in the prostate through 19 needles. The total apparent activity is 27.0750 mCi. Under fluoroscopy there appears to be good seeds distribution. But one seed appears to be in the bladder..  The Foley catheter was then removed. A flexible cystoscope was passed in the bladder. The anterior urethra is normal there is moderate prostatic hypertrophy. There is a blood clot in the bladder and within the blood clot a seed was noted. There is no tumor or stone in the bladder. The ureteral orifices are in normal position and shape. The flexible cystoscope was removed. A rigid cystoscope was inserted in the bladder and  the seed was removed with a grasping forceps.  A #16 French Foley catheter was then reinserted in the bladder.  The patient tolerated the procedure well and left the OR in satisfactory condition to postanesthesia care unit.  EBL: Minimal

## 2012-11-19 NOTE — Anesthesia Preprocedure Evaluation (Addendum)
Anesthesia Evaluation  Patient identified by MRN, date of birth, ID band Patient awake    Reviewed: Allergy & Precautions, H&P , NPO status , Patient's Chart, lab work & pertinent test results  Airway Mallampati: II TM Distance: >3 FB Neck ROM: Full    Dental no notable dental hx. (+) Teeth Intact   Pulmonary sleep apnea and Continuous Positive Airway Pressure Ventilation , former smoker,  breath sounds clear to auscultation  Pulmonary exam normal       Cardiovascular hypertension, Pt. on medications Rhythm:Regular Rate:Normal     Neuro/Psych negative neurological ROS  negative psych ROS   GI/Hepatic negative GI ROS, Neg liver ROS, Elevated LFT's   Endo/Other  diabetes, Type 2, Oral Hypoglycemic AgentsMorbid obesity  Renal/GU negative Renal ROS  negative genitourinary   Musculoskeletal negative musculoskeletal ROS (+)   Abdominal (+) + obese,   Peds negative pediatric ROS (+)  Hematology negative hematology ROS (+)   Anesthesia Other Findings   Reproductive/Obstetrics negative OB ROS                          Anesthesia Physical Anesthesia Plan  ASA: III  Anesthesia Plan: General   Post-op Pain Management:    Induction: Intravenous  Airway Management Planned: Oral ETT  Additional Equipment:   Intra-op Plan:   Post-operative Plan: Extubation in OR  Informed Consent: I have reviewed the patients History and Physical, chart, labs and discussed the procedure including the risks, benefits and alternatives for the proposed anesthesia with the patient or authorized representative who has indicated his/her understanding and acceptance.   Dental advisory given  Plan Discussed with: CRNA  Anesthesia Plan Comments: (Avoid tylenol)       Anesthesia Quick Evaluation

## 2012-11-19 NOTE — Transfer of Care (Signed)
Immediate Anesthesia Transfer of Care Note  Patient: Daniel [name]  Procedure(s) Performed: Procedure(s): RADIOACTIVE SEED IMPLANT (N/A)  Patient Location: PACU  Anesthesia Type:General  Level of Consciousness: awake, alert  and oriented  Airway & Oxygen Therapy: Patient Spontanous Breathing and Patient connected to face mask oxygen  Post-op Assessment: Report given to PACU RN and Post -op Vital signs reviewed and stable  Post vital signs: Reviewed and stable  Complications: No apparent anesthesia complications

## 2012-11-19 NOTE — Progress Notes (Addendum)
CC: Dr. Su Grand   End of treatment summary:  Diagnosis: Stage TI C. favorable risk adenocarcinoma prostate.  Intent: Curative  Requesting physician: Dr. Su Grand  Site/dose: Prostate 14,500 cGy, isotope I-125 utilizing 19 needles to implant 57 seeds . One seed was removed at the time of cystoscopy for a total of 56 seeds implanted. Individual seed activity 0.475 mCi per seed for a total implant activity of 27.1 mCi.  Narrative: Daniel [name] appears to have undergone a successful Nucletron seed Selectron implant with Dr. Brunilda Payor.  Plan: Followup visit with Dr. Brunilda Payor and myself in approximately 3 weeks. We will obtain a CT scan that time for his post implant dosimetry.

## 2012-11-19 NOTE — Progress Notes (Addendum)
[facility] Radiation Oncology Brachytherapy Operative Procedure Note  Name: Daniel [name] MRN: [id number]  Date:   09/30/2012           DOB: Dec 21, 1955  Status:outpatient    WU:[id number],GNFAOZ, MD  Dr. Su Grand DIAGNOSIS: A 57 year old gentlemen with stage T1 C. adenocarcinoma of the prostate with a Gleason of 6 and a PSA of 6.95.  PROCEDURE: Insertion of radioactive I-125 seeds into the prostate gland.  RADIATION DOSE: 145 Gy, definitive therapy.  TECHNIQUE: Daniel [name] was brought to the operating room with Dr. Brunilda Payor. He was placed in the dorsolithotomy position. He was catheterized and a rectal tube was inserted. The perineum was shaved, prepped and draped. The ultrasound probe was then introduced into the rectum to see the prostate gland.  TREATMENT DEVICE: A needle grid was attached to the ultrasound probe stand and anchor needles were placed.  COMPLEX ISODOSE CALCULATION: The prostate was imaged in 3D using a sagittal sweep of the prostate probe. These images were transferred to the planning computer. There, the prostate, urethra and rectum were defined on each axial reconstructed image. Then, the software created an optimized plan and a few seed positions were adjusted. Then the accepted plan was uploaded to the seed Selectron afterloading unit.  SPECIAL TREATMENT PROCEDURE/SUPERVISION AND HANDLING: The Nucletron FIRST system was used to place the needles under sagittal guidance. A total of 19 needles were used to deposit 57 seeds in the prostate gland. One seed was removed at the time of cystoscopy following his implant, thus he had a total of 56 seeds implanted. The individual seed activity was 0.475 mCi for a total implant activity of 27.1 mCi.  COMPLEX SIMULATION: At the end of the procedure, an anterior radiograph of the pelvis was obtained to document seed positioning and count. Cystoscopy was performed to check the urethra and bladder. As mentioned above, once  he was removed from the bladder for a total of 56 seeds implanted.  MICRODOSIMETRY: At the end of the procedure, the patient was emitting 0.02 mrem/hr at 1 meter. Accordingly, he was considered safe for hospital discharge.  PLAN: The patient will return to the radiation oncology clinic for post implant CT dosimetry in three weeks.

## 2012-11-19 NOTE — Preoperative (Signed)
Beta Blockers   Reason not to administer Beta Blockers:Not Applicable 

## 2012-11-19 NOTE — Anesthesia Postprocedure Evaluation (Signed)
  Anesthesia Post-op Note  Patient: Daniel [name]  Procedure(s) Performed: Procedure(s) (LRB): RADIOACTIVE SEED IMPLANT (N/A)  Patient Location: PACU  Anesthesia Type: General  Level of Consciousness: awake and alert   Airway and Oxygen Therapy: Patient Spontanous Breathing  Post-op Pain: mild  Post-op Assessment: Post-op Vital signs reviewed, Patient's Cardiovascular Status Stable, Respiratory Function Stable, Patent Airway and No signs of Nausea or vomiting  Last Vitals:  Filed Vitals:   11/19/12 1227  BP: 153/71  Pulse: 91  Temp: 36.6 C  Resp: 14    Post-op Vital Signs: stable   Complications: No apparent anesthesia complications

## 2012-11-19 NOTE — Progress Notes (Signed)
Used radiation alert inspector instrument to scan urine in foley bag and bed sheets for radioactive seeds. None found. Family instructed what to do if seeds are found.

## 2012-11-20 ENCOUNTER — Encounter (HOSPITAL_COMMUNITY): Payer: Self-pay | Admitting: Urology

## 2012-12-10 ENCOUNTER — Telehealth: Payer: Self-pay | Admitting: *Deleted

## 2012-12-10 NOTE — Telephone Encounter (Signed)
CALLED PATIENT TO REMIND OF APPTS. FOR 12-11-12, LVM FOR A RETURN CALL

## 2012-12-11 ENCOUNTER — Ambulatory Visit
Admit: 2012-12-11 | Discharge: 2012-12-11 | Disposition: A | Discharge status: Home / Self Care | Payer: Managed Care, Other (non HMO) | Attending: Radiation Oncology | Admitting: Radiation Oncology

## 2012-12-11 ENCOUNTER — Ambulatory Visit
Admission: RE | Admit: 2012-12-11 | Discharge: 2012-12-11 | Disposition: A | Discharge status: Home / Self Care | Payer: Managed Care, Other (non HMO) | Source: Ambulatory Visit | Attending: Radiation Oncology | Admitting: Radiation Oncology

## 2012-12-11 ENCOUNTER — Inpatient Hospital Stay
Admission: RE | Admit: 2012-12-11 | Discharge: 2012-12-11 | Disposition: A | Discharge status: Home / Self Care | Payer: Self-pay | Source: Ambulatory Visit | Admitting: Radiation Oncology

## 2012-12-11 ENCOUNTER — Encounter: Payer: Self-pay | Admitting: Radiation Oncology

## 2012-12-11 VITALS — BP 109/70 | HR 89 | Temp 97.5°F | Ht 74.0 in | Wt 349.5 lb

## 2012-12-11 DIAGNOSIS — C61 Malignant neoplasm of prostate: Secondary | ICD-10-CM

## 2012-12-11 NOTE — Progress Notes (Signed)
CC: Dr. Su Grand  Followup note: Mr. Zwilling returns today approximately 3 weeks following prostate seed implantation in the management of his stage TI C. favorable risk adenocarcinoma prostate. He still doing well from a GU and GI standpoint although he does have intermittent slowing of his urinary stream. He denies dysuria. He does have nocturia x1. No GI difficulties. He is to see Dr. Brunilda Payor later today. He is not on an alpha blocker.  Physical examination: Alert and oriented. Wt Readings from Last 3 Encounters:  12/11/12 349 lb 8 oz (158.532 kg)  11/13/12 351 lb (159.213 kg)  05/03/07 410 lb 8 oz (186.202 kg)   Temp Readings from Last 3 Encounters:  12/11/12 97.5 F (36.4 C)   11/19/12 97.9 F (36.6 C) Oral  11/19/12 97.9 F (36.6 C) Oral   BP Readings from Last 3 Encounters:  12/11/12 109/70  11/19/12 153/71  11/19/12 153/71   Pulse Readings from Last 3 Encounters:  12/11/12 89  11/19/12 91  11/19/12 91   Rectal examination not performed today.  His CT scan for his post implant dosimetry shows an excellent seed distribution.  Impression: Satisfactory progress with slight increase in obstructive symptomatology. Dr. Brunilda Payor may prescribe an alpha blocker.  Plan: We'll move ahead with his post implant dosimetry and forward the results of Dr. Brunilda Payor. I've not scheduled the patient for a formal followup visit and I ask that Dr. Brunilda Payor keep me posted on his progress.

## 2012-12-11 NOTE — Progress Notes (Addendum)
Daniel [name] is here status post prostate seed implant.  He states his urinary stream is slow, but he denies any dysuria nor rectal irritation.  He has returned to work and denies any fatigue.

## 2012-12-11 NOTE — Progress Notes (Signed)
Complex simulation note: The patient was taken to the CT simulator. He was placed supine. His pelvis was scanned. There appeared to be an excellent seed distribution. The CT data set was sent to the [facility] system for contouring of his prostate and rectum. He'll then undergo 3-D simulation to assess the quality of his implant.

## 2013-02-11 ENCOUNTER — Ambulatory Visit
Admission: RE | Admit: 2013-02-11 | Discharge: 2013-02-11 | Disposition: A | Discharge status: Home / Self Care | Payer: Managed Care, Other (non HMO) | Source: Ambulatory Visit | Attending: Radiation Oncology | Admitting: Radiation Oncology

## 2013-02-11 ENCOUNTER — Encounter: Payer: Self-pay | Admitting: Radiation Oncology

## 2013-02-11 DIAGNOSIS — C61 Malignant neoplasm of prostate: Secondary | ICD-10-CM | POA: Insufficient documentation

## 2013-02-11 NOTE — Progress Notes (Signed)
CC: Dr. Su Grand  Post implant CT dosimetry/3-D simulation note: The patient underwent post-implant CT dosimetry/3-D simulation to assess the quality of his implant. His intraoperative prostate volume by ultrasound was 31.6 cc while his postoperative prostate volume by CT was 31.2 cc, a good correlation. Dose volume histograms were obtained for the prostate and rectum. His prostate D 90 is 102.5% and V100 91.7%, both excellent. Only 0.47 cc of rectum received the prescribed dose of 14,500 cGy. We met our departmental goals. In summary, the patient has excellent post implant dosimetry with a low risk for late rectal toxicity.

## 2013-04-02 ENCOUNTER — Encounter: Payer: Self-pay | Admitting: *Deleted

## 2014-04-01 IMAGING — CR DG CHEST 2V
2 series · 2 of 2 positions shown · non-contrast
Comparison: None

CLINICAL DATA: Prostate cancer, preoperative evaluation, history
hypertension, diabetes

CHEST - 2 VIEW

[w chest lat]
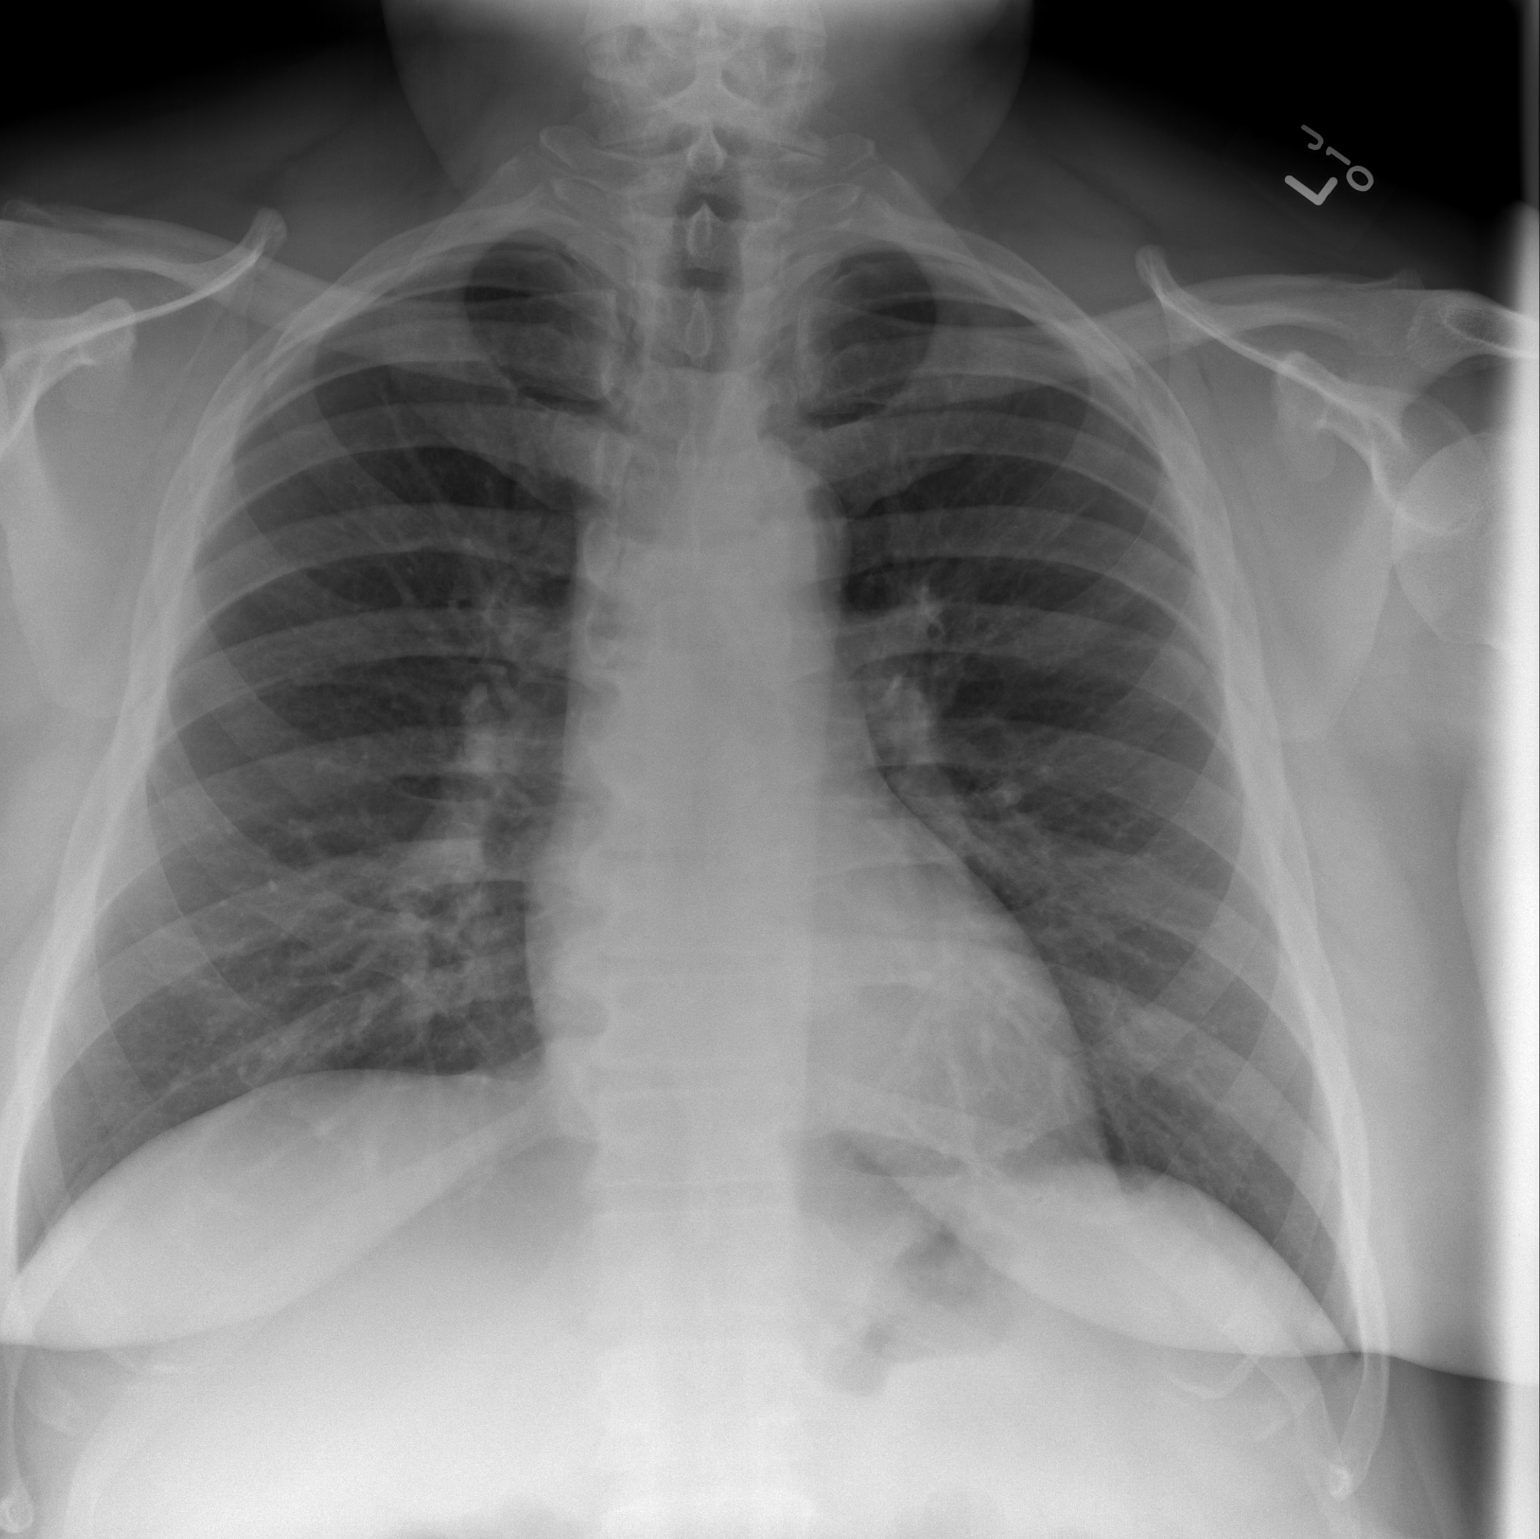

[w chest lat *]
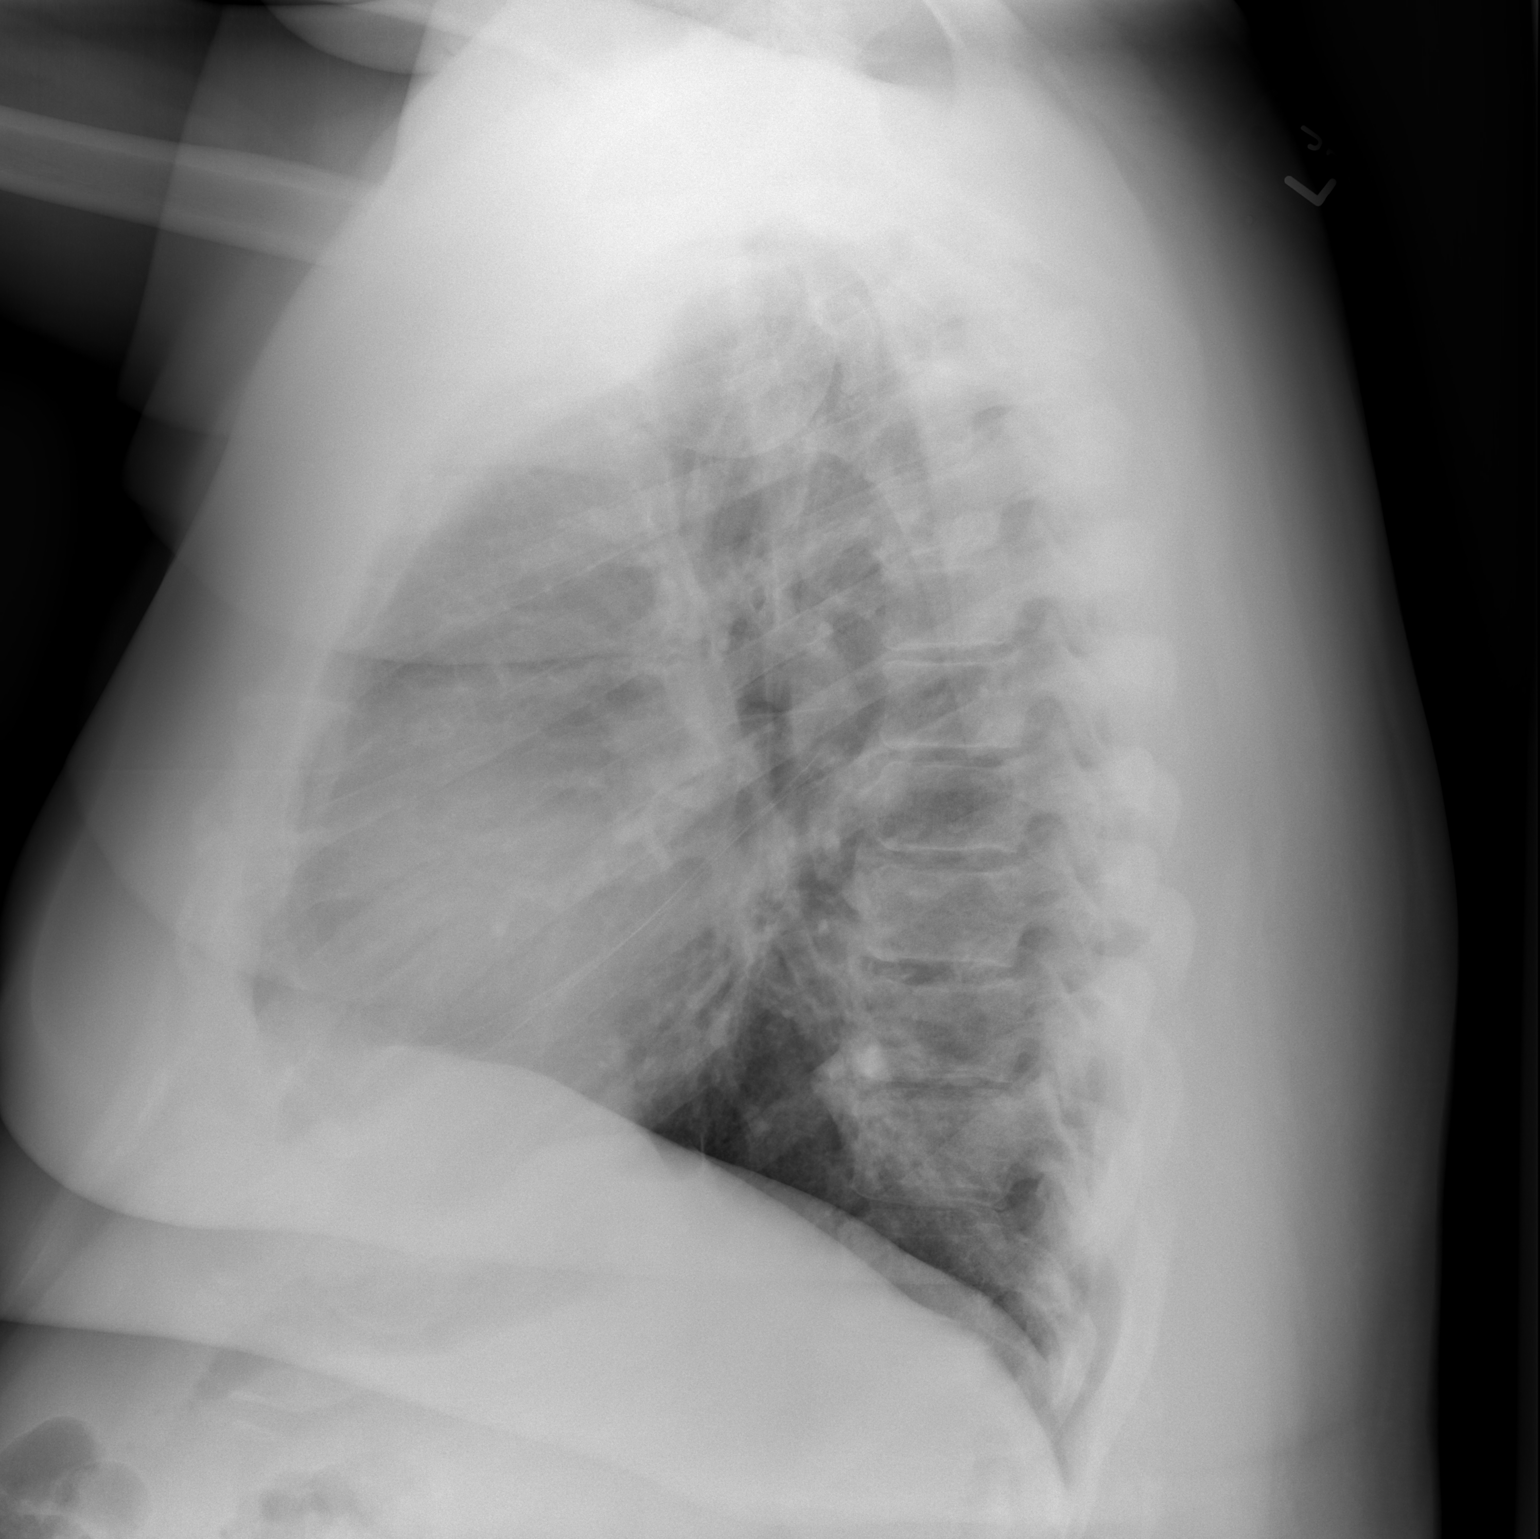

[2 of 2 positions shown; findings below may reference images not displayed]

FINDINGS: Normal heart size, mediastinal contours, and pulmonary vascularity.
Peribronchial thickening.
No pulmonary infiltrate, pleural effusion or pneumothorax.
Scattered degenerative disc disease changes thoracic spine.
No definite sclerotic osseous metastases identified.
IMPRESSION: Mild bronchitic changes.

## 2016-09-22 ENCOUNTER — Other Ambulatory Visit: Payer: Self-pay | Admitting: Neurosurgery

## 2016-11-01 ENCOUNTER — Encounter (HOSPITAL_COMMUNITY): Payer: Self-pay

## 2016-11-01 ENCOUNTER — Ambulatory Visit (HOSPITAL_COMMUNITY): Admit: 2016-11-01 | Payer: Managed Care, Other (non HMO) | Admitting: Neurosurgery

## 2016-11-01 SURGERY — ANTERIOR CERVICAL DECOMPRESSION/DISCECTOMY FUSION 4 LEVELS
Anesthesia: General

## 2018-06-05 ENCOUNTER — Other Ambulatory Visit (HOSPITAL_BASED_OUTPATIENT_CLINIC_OR_DEPARTMENT_OTHER): Payer: Self-pay | Admitting: Internal Medicine

## 2018-06-05 ENCOUNTER — Ambulatory Visit (HOSPITAL_BASED_OUTPATIENT_CLINIC_OR_DEPARTMENT_OTHER)
Admission: RE | Admit: 2018-06-05 | Discharge: 2018-06-05 | Disposition: A | Discharge status: Home / Self Care | Payer: Managed Care, Other (non HMO) | Source: Ambulatory Visit | Attending: Internal Medicine | Admitting: Internal Medicine

## 2018-06-05 ENCOUNTER — Ambulatory Visit (HOSPITAL_BASED_OUTPATIENT_CLINIC_OR_DEPARTMENT_OTHER): Payer: Managed Care, Other (non HMO)

## 2018-06-05 ENCOUNTER — Other Ambulatory Visit: Payer: Self-pay

## 2018-06-05 ENCOUNTER — Encounter (HOSPITAL_BASED_OUTPATIENT_CLINIC_OR_DEPARTMENT_OTHER): Payer: Self-pay

## 2018-06-05 ENCOUNTER — Telehealth (HOSPITAL_BASED_OUTPATIENT_CLINIC_OR_DEPARTMENT_OTHER): Payer: Self-pay | Admitting: Emergency Medicine

## 2018-06-05 ENCOUNTER — Emergency Department (HOSPITAL_BASED_OUTPATIENT_CLINIC_OR_DEPARTMENT_OTHER)
Admission: EM | Admit: 2018-06-05 | Discharge: 2018-06-05 | Disposition: A | Discharge status: Home / Self Care | Payer: Managed Care, Other (non HMO) | Attending: Emergency Medicine | Admitting: Emergency Medicine

## 2018-06-05 DIAGNOSIS — I82431 Acute embolism and thrombosis of right popliteal vein: Secondary | ICD-10-CM | POA: Diagnosis not present

## 2018-06-05 DIAGNOSIS — Z7984 Long term (current) use of oral hypoglycemic drugs: Secondary | ICD-10-CM | POA: Insufficient documentation

## 2018-06-05 DIAGNOSIS — Z8546 Personal history of malignant neoplasm of prostate: Secondary | ICD-10-CM | POA: Diagnosis not present

## 2018-06-05 DIAGNOSIS — M7989 Other specified soft tissue disorders: Secondary | ICD-10-CM

## 2018-06-05 DIAGNOSIS — Z87891 Personal history of nicotine dependence: Secondary | ICD-10-CM | POA: Insufficient documentation

## 2018-06-05 DIAGNOSIS — Z7982 Long term (current) use of aspirin: Secondary | ICD-10-CM | POA: Insufficient documentation

## 2018-06-05 DIAGNOSIS — I82411 Acute embolism and thrombosis of right femoral vein: Secondary | ICD-10-CM | POA: Diagnosis not present

## 2018-06-05 DIAGNOSIS — E119 Type 2 diabetes mellitus without complications: Secondary | ICD-10-CM | POA: Diagnosis not present

## 2018-06-05 DIAGNOSIS — I10 Essential (primary) hypertension: Secondary | ICD-10-CM | POA: Insufficient documentation

## 2018-06-05 DIAGNOSIS — Z79899 Other long term (current) drug therapy: Secondary | ICD-10-CM | POA: Diagnosis not present

## 2018-06-05 DIAGNOSIS — I82401 Acute embolism and thrombosis of unspecified deep veins of right lower extremity: Secondary | ICD-10-CM | POA: Diagnosis present

## 2018-06-05 LAB — BASIC METABOLIC PANEL
ANION GAP: 7 (ref 5–15)
BUN: 21 mg/dL (ref 8–23)
CALCIUM: 8.8 mg/dL — AB (ref 8.9–10.3)
CO2: 22 mmol/L (ref 22–32)
CREATININE: 0.81 mg/dL (ref 0.61–1.24)
Chloride: 108 mmol/L (ref 98–111)
GFR calc non Af Amer: >60 mL/min (ref >60)
GLUCOSE: 136 mg/dL — AB (ref 70–99)
Potassium: 4.1 mmol/L (ref 3.5–5.1)
Sodium: 137 mmol/L (ref 135–145)

## 2018-06-05 LAB — CBC
HCT: 45.9 % (ref 39.0–52.0)
Hemoglobin: 14.3 g/dL (ref 13.0–17.0)
MCH: 29.6 pg (ref 26.0–34.0)
MCHC: 31.2 g/dL (ref 30.0–36.0)
MCV: 95 fL (ref 80.0–100.0)
NRBC: 0 % (ref 0.0–0.2)
PLATELETS: 141 10*3/uL — AB (ref 150–400)
RBC: 4.83 MIL/uL (ref 4.22–5.81)
RDW: 13.1 % (ref 11.5–15.5)
WBC: 6.7 10*3/uL (ref 4.0–10.5)

## 2018-06-05 MED ORDER — RIVAROXABAN (XARELTO) VTE STARTER PACK (15 & 20 MG): ORAL_TABLET | ORAL | 0 refills | Status: AC

## 2018-06-05 NOTE — ED Provider Notes (Signed)
[place] EMERGENCY DEPARTMENT Provider Note   CSN: [id number] Arrival date & time: 06/05/18  1940     History   Chief Complaint Chief Complaint  Patient presents with  . DVT Rt LE    HPI Daniel [name] is a 63 y.o. male with a history of diabetes mellitus, hypertension, hyperlipidemia, obstructive sleep apnea, morbid obesity, and prostate cancer status post radioactive seed implant who presents to the emergency department from ultrasound department for positive DVT study of the right lower extremity. Patient states he has has progressively worsening swelling to the RLE w/ associated tightness sensation. No specific alleviating/aggravating factors. Seen by PCP who ordered venous duplex study. Study + for DVT- sent to ER for further management. Denies dyspnea/chest pain. Denies  hemoptysis, recent surgery/trauma, recent long travel, hormone use, or hx of DVT/PE. Hx of prostate cancer in remission, no recent tx for this. Denies prior bleeding issues, EtOH abuse, prior stroke, or hx of renal/liver disease. Does not take NSAIDs or ASA on regular basis.     HPI  Past Medical History:  Diagnosis Date  . Arthritis   . Diabetes mellitus without complication ([place])   . Elevated PSA 08/29/2012   7.22  . Hyperlipidemia   . Hypertension   . Obstructive sleep apnea    CPAP  . Prostate cancer [facility]) Feb 2014  . Spinal stenosis     Patient Active Problem List   Diagnosis Date Noted  . Prostate cancer ([place]) 09/26/2012  . DYSLIPIDEMIA 05/02/2007  . MORBID OBESITY 05/02/2007  . OBSTRUCTIVE SLEEP APNEA 05/02/2007  . HYPERTENSION 05/02/2007    Past Surgical History:  Procedure Laterality Date  . KNEE SURGERY     torn meniscus  . PILONIDAL CYST EXCISION     lower spine age 22  . PROSTATE BIOPSY Bilateral 08/29/2012   gleason 3+3=6  Dr.Marc Nesi  . RADIOACTIVE SEED IMPLANT N/A 11/19/2012   Procedure: RADIOACTIVE SEED IMPLANT;  Surgeon: Hanley Ben, MD;  Location: [facility];   Service: Urology;  Laterality: N/A;  . SPINAL CORD DECOMPRESSION    . Spinal injection  2012   spinal stenosis  . TONSILLECTOMY     as child age 20 or 5        Home Medications    Prior to Admission medications   Medication Sig Start Date End Date Taking? Authorizing Provider  aspirin (ASPIRIN EC) 81 MG EC tablet Take 81 mg by mouth daily.     [provider]  Canagliflozin (INVOKANA) 300 MG TABS Take 300 mg by mouth every morning.     [provider]  clotrimazole-betamethasone (LOTRISONE) cream  11/25/12   [provider]  Colesevelam HCl [facility]) 3.75 G PACK Take 3.75 g by mouth daily.     [provider]  Exenatide (BYDUREON [place]) Inject into the skin once a week.    [provider]  furosemide (LASIX) 20 MG tablet Take 20 mg by mouth every morning.     [provider]  losartan-hydrochlorothiazide (HYZAAR) 100-25 MG per tablet Take 1 tablet by mouth every morning.     [provider]  metFORMIN (GLUCOPHAGE) 1000 MG tablet Take 1,000-2,000 mg by mouth 2 (two) times daily with a meal. Takes 2 tablets in the morning and 1 in the evening    [provider]  naproxen sodium (ANAPROX) 220 MG tablet Take 220 mg by mouth 2 (two) times daily with a meal.    [provider]  repaglinide (PRANDIN) 2 MG  tablet Take 8 mg by mouth 2 (two) times daily before a meal.    [provider]    Family History No family history on file.  Social History Social History   Tobacco Use  . Smoking status: Former Smoker    Packs/day: 0.50    Years: 10.00    Pack years: 5.00    Types: Cigarettes, Pipe, Cigars    Last attempt to quit: 09/25/1997    Years since quitting: 20.7  . Smokeless tobacco: Never Used  Substance Use Topics  . Alcohol use: Yes    Comment: 1-2 daily  . Drug use: No     Allergies   Patient has no known allergies.   Review of Systems Review of Systems  Constitutional: Negative for chills  and fever.  Respiratory: Negative for cough and shortness of breath.        Negative for hemoptysis.   Cardiovascular: Positive for leg swelling. Negative for chest pain.  Musculoskeletal: Positive for myalgias.  Skin: Negative for color change and wound.  Neurological: Negative for weakness and numbness.  All other systems reviewed and are negative.    Physical Exam Updated Vital Signs BP (!) 157/72 (BP Location: Left Arm)   Pulse 82   Temp 98 F (36.7 C) (Oral)   Resp 20   Ht 6\' 2"  (1.88 m)   Wt (!) 157.4 kg   SpO2 97%   BMI 44.55 kg/m   Physical Exam Vitals signs and nursing note reviewed.  Constitutional:      General: He is not in acute distress.    Appearance: He is well-developed. He is not toxic-appearing.  HENT:     Head: Normocephalic and atraumatic.  Eyes:     General:        Right eye: No discharge.        Left eye: No discharge.     Conjunctiva/sclera: Conjunctivae normal.  Neck:     Musculoskeletal: Neck supple.  Cardiovascular:     Rate and Rhythm: Normal rate and regular rhythm.     Comments: 2+ DP/PT pulses bilaterally.  Pulmonary:     Effort: Pulmonary effort is normal. No respiratory distress.     Breath sounds: Normal breath sounds. No wheezing, rhonchi or rales.  Abdominal:     General: There is no distension.     Palpations: Abdomen is soft.     Tenderness: There is no abdominal tenderness.  Musculoskeletal:     Comments: LEs: RLE: 2+ pitting edema noted distal to the knee. No significant erythema/warmth or open wounds. Symmetric temperature. Not cyanotic appearing. Intact AROM to bilateral hips/knees/ankles/digits. Mildly tender to the R calf. NVI distally.   Skin:    General: Skin is warm and dry.     Findings: No rash.  Neurological:     Mental Status: He is alert.     Comments: Clear speech. Sensation grossly intact to bilateral lower extremities. 5/5 strength with plantar/dorsiflexion bilaterally. Ambulatory.   Psychiatric:         Behavior: Behavior normal.      ED Treatments / Results  Labs (all labs ordered are listed, but only abnormal results are displayed) Labs Reviewed  CBC - Abnormal; Notable for the following components:      Result Value   Platelets 141 (*)    All other components within normal limits  BASIC METABOLIC PANEL - Abnormal; Notable for the following components:   Glucose, Bld 136 (*)    Calcium 8.8 (*)  All other components within normal limits    EKG None  Radiology US Venous Img Lower Unilateral Right  Result Date: 06/05/2018 CLINICAL DATA:  RIGHT lower extremity swelling for 3 weeks EXAM: RIGHT LOWER EXTREMITY VENOUS DOPPLER ULTRASOUND TECHNIQUE: Gray-scale sonography with graded compression, as well as color Doppler and duplex ultrasound were performed to evaluate the lower extremity deep venous systems from the level of the common femoral vein and including the common femoral, femoral, profunda femoral, popliteal and calf veins including the posterior tibial, peroneal and gastrocnemius veins when visible. The superficial great saphenous vein was also interrogated. Spectral Doppler was utilized to evaluate flow at rest and with distal augmentation maneuvers in the common femoral, femoral and popliteal veins. COMPARISON:  None FINDINGS: Contralateral Common Femoral Vein: Respiratory phasicity is normal and symmetric with the symptomatic side. No evidence of thrombus. Normal compressibility. Common Femoral Vein: No evidence of thrombus. Normal compressibility, respiratory phasicity and response to augmentation. Saphenofemoral Junction: No evidence of thrombus. Normal compressibility and flow on color Doppler imaging. Profunda Femoral Vein: No evidence of thrombus. Normal compressibility and flow on color Doppler imaging. Femoral Vein: Hypoechoic occlusive thrombus identified within the RIGHT femoral vein. Absent spontaneous venous flow. Impaired compressibility. Popliteal Vein: Occlusive  hypoechoic thrombus in RIGHT popliteal vein. Impaired compressibility. Absent spontaneous venous flow. Calf Veins: Peroneal vein not visualized. Posterior tibial vein patent. Superficial Great Saphenous Vein: No evidence of thrombus. Normal compressibility. Venous Reflux:  None. Other Findings:  None. IMPRESSION: Acute appearing deep venous thrombosis within the RIGHT femoral and popliteal veins. Electronically Signed   By: Lavonia Dana M.D.   On: 06/05/2018 19:28    Procedures Procedures (including critical care time)  Medications Ordered in ED Medications - No data to display   Initial Impression / Assessment and Plan / ED Course  I have reviewed the triage vital signs and the nursing notes.  Pertinent labs & imaging results that were available during my care of the patient were reviewed by me and considered in my medical decision making (see chart for details).   Patient presents to the ER due to + DVT study in outpatient setting. Confirmed RLE DVT: thrombosis of the right femoral & popliteal veins. NVI distally. Exam does not seem consistent with cerulea dolens/albicans. No chest pain/dyspnea to raise concern for PE. Basic labs drawn: renal function preserved, mild thrombocytopenia- PCP recheck. No identifiable contraindications to anticoagulation. We discussed no NSAIDs/Aspirin with this medicine. We discussed that should patient have a head injury or experience other traumatic event, melena/hematochezia, hematuria, hemoptysis, or other uncontrolled bleeding to return to the ER immediately. We also discussed return precautions for PE. Will start on xarelto. PCP follow up. I discussed results, treatment plan, need for follow-up, and return precautions with the patient. Provided opportunity for questions, patient confirmed understanding and is in agreement with plan.   Findings and plan of care discussed with supervising physician Dr. Laverta Baltimore who is in agreement.   Final Clinical Impressions(s) /  ED Diagnoses   Final diagnoses:  Acute deep vein thrombosis (DVT) of femoral vein of right lower extremity [facility])    ED Discharge Orders         Ordered    Rivaroxaban 15 & 20 MG TBPK     06/05/18 2143           Amaryllis Dyke, PA-C 06/05/18 2149    Margette Fast, MD 06/06/18 1040

## 2018-06-05 NOTE — Telephone Encounter (Signed)
Received call from pharmacy at [facility] in [facility], informed us that the medication prescribed to pt wouldn't be available until Friday. Pharmacy states [facility] on [address] in [place] does have medication. Pharmacy at [facility] will transfer the prescription to [facility]. Patient called and message left on voice mail instructing pt to pick up prescription in the morning at [facility] on [address] in [place].

## 2018-06-05 NOTE — Discharge Instructions (Addendum)
You are seen in the emergency department today and diagnosed with a DVT.  We have started you on Xarelto, this is a blood thinner, please see detailed information regarding this medicine in your discharge instructions.  As discussed this medication blood in your blood, this makes you at higher risk for bleeding.  This reason it is important that should you fall and hit your head or have any other traumatic injuries that you seek care at an emergency department for evaluation.  It is also important that should you experience blood in your stool, blood in your urine, dark or tarry stools, coughing up blood or any other type of abnormal bleeding that you return to the ER immediately.  Take anti-inflammatories with this medicine such as Motrin, Aleve, Aspirin ibuprofen, Advil, naproxen, Mobic, Diflucan out, Goody powders, etc.   Additionally having a blood clot in your legs make you higher risk for a blood clot in your lungs, should you experience shortness of breath or chest pain return immediately.  With your primary care provider within 3 days.  Return to the ER for new or worsening symptoms or any other concerns.

## 2018-06-05 NOTE — ED Triage Notes (Signed)
Pt states he was sent from [place] with dx DVT right LE-NAD-steady gait

## 2020-01-25 IMAGING — US US EXTREM LOW VENOUS*R*
1 series · 13 of 21 positions shown · non-contrast
Comparison: None

CLINICAL DATA: RIGHT lower extremity swelling for 3 weeks



[Series 1: us extrem low venous*right* · 0.11mm/px · 13 of 21 slices shown]
[im 1/21]
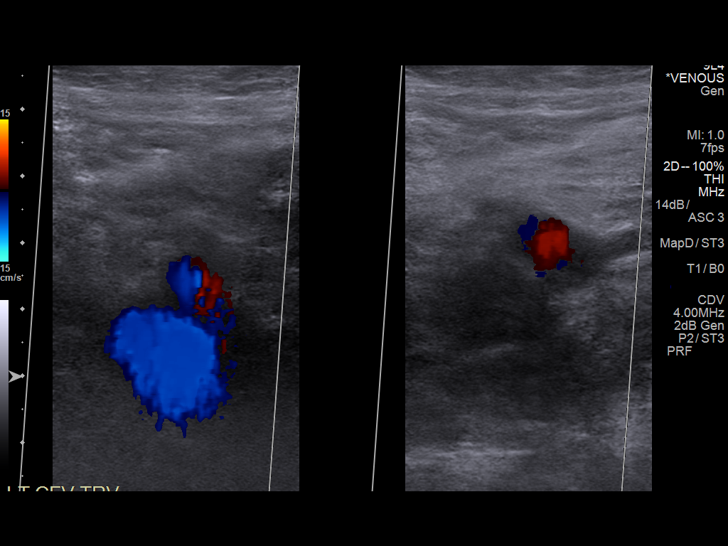
[im 3/21]
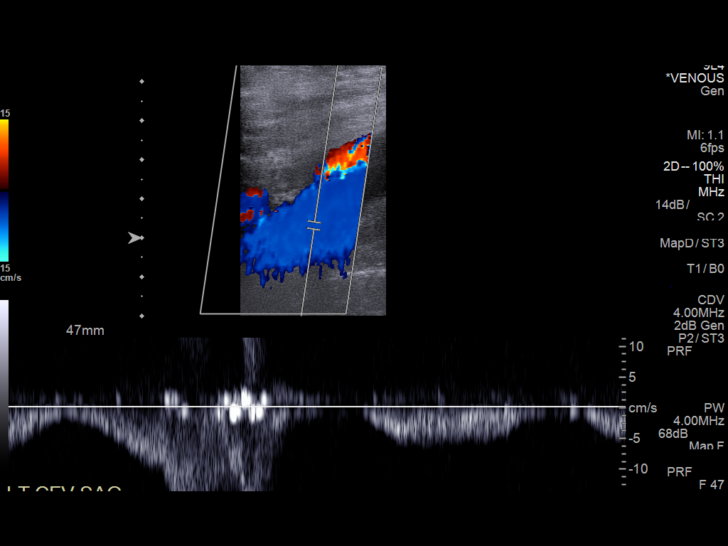
[im 5/21]
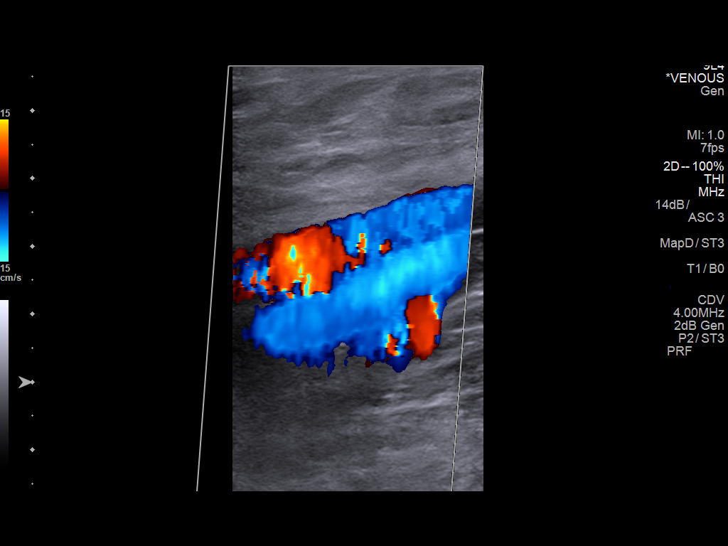
[im 6/21]
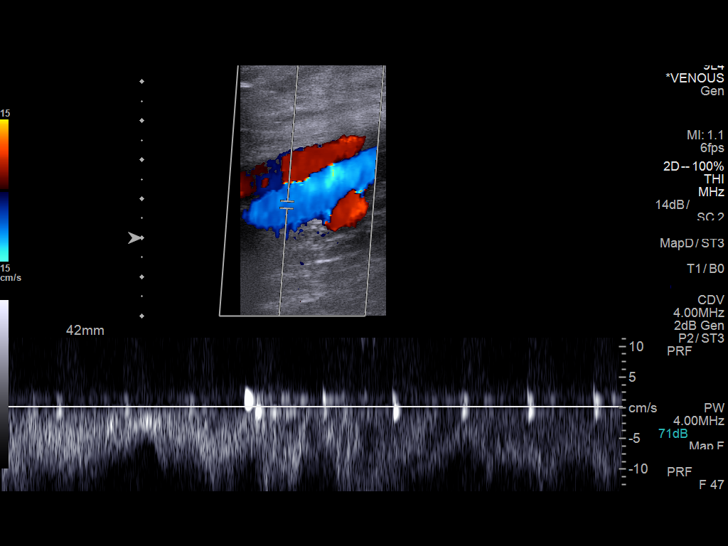
[im 8/21]
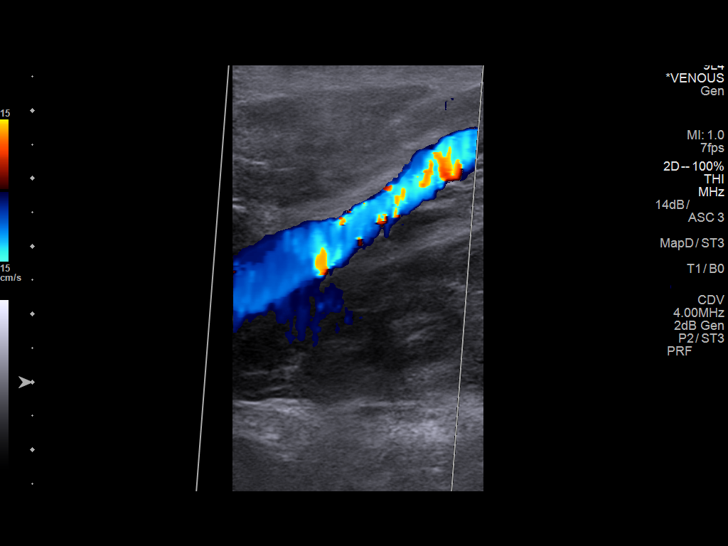
[im 9/21]
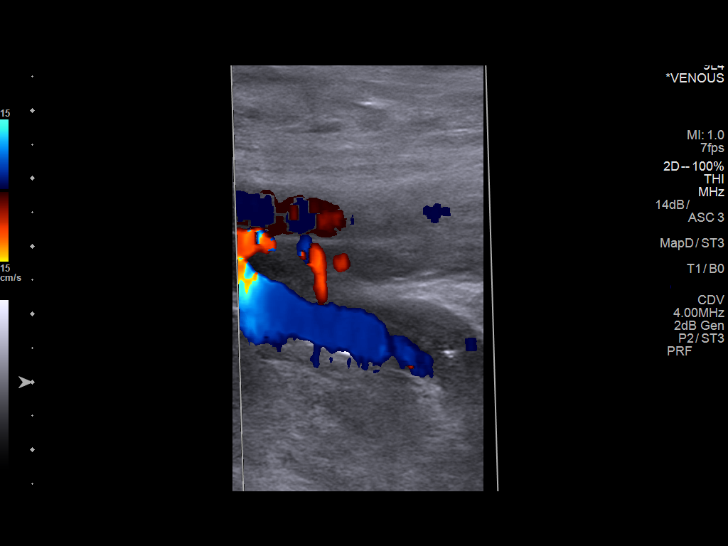
[im 11/21]
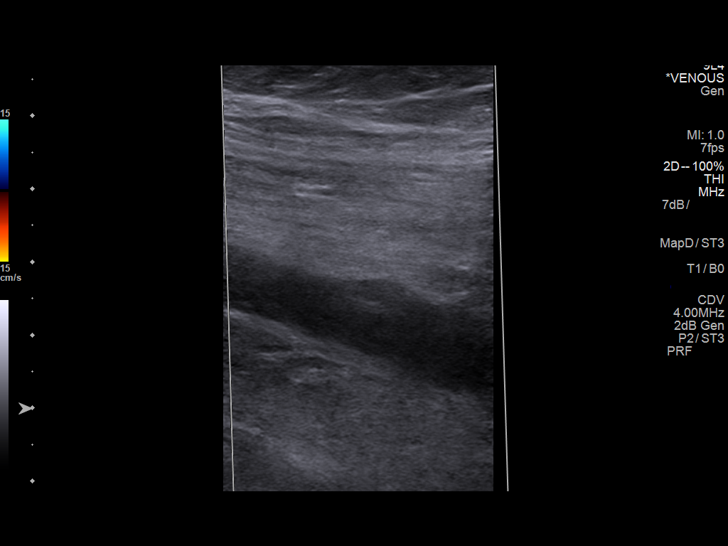
[im 13/21]
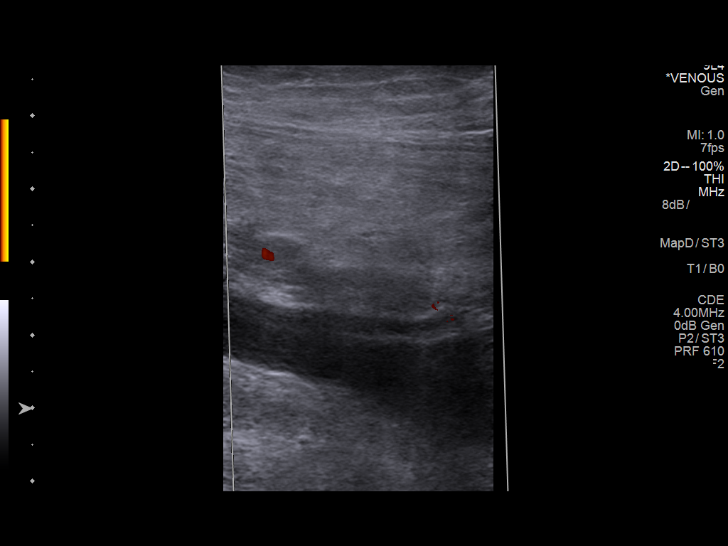
[im 14/21]
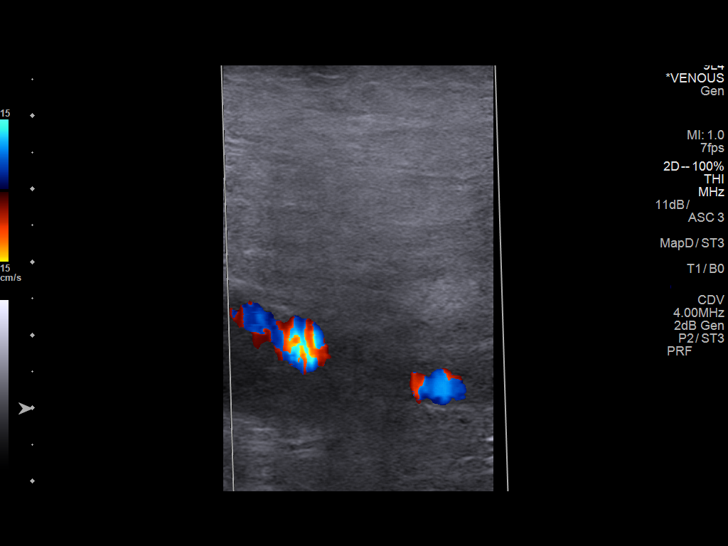
[im 16/21]
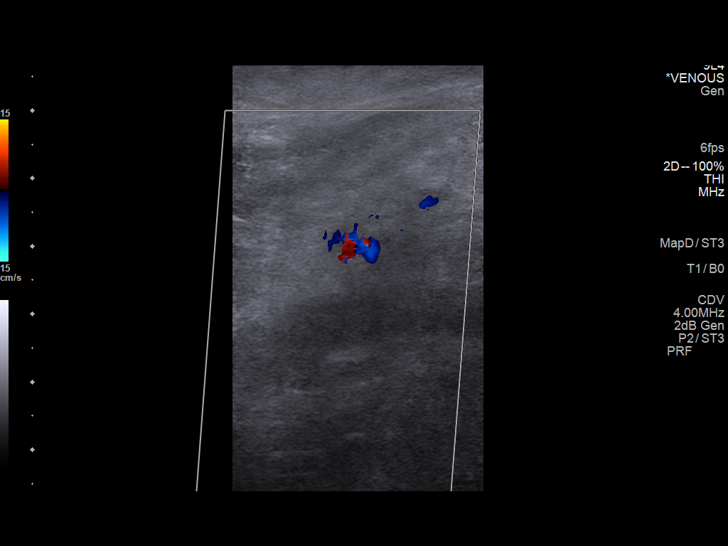
[im 17/21]
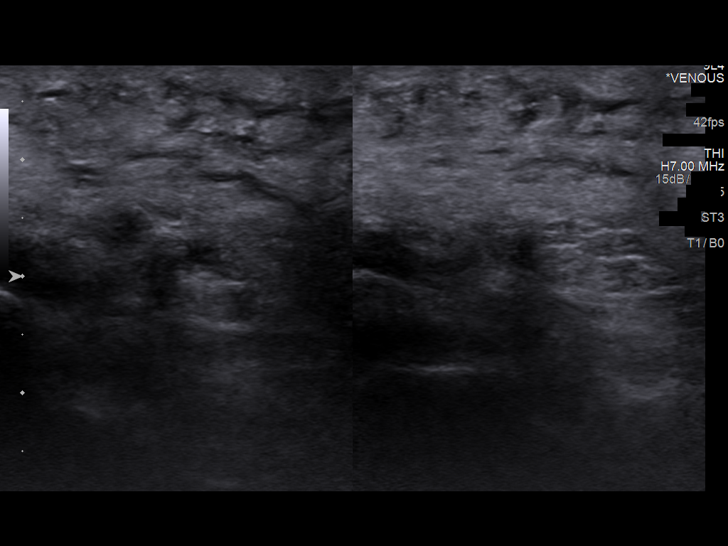
[im 19/21]
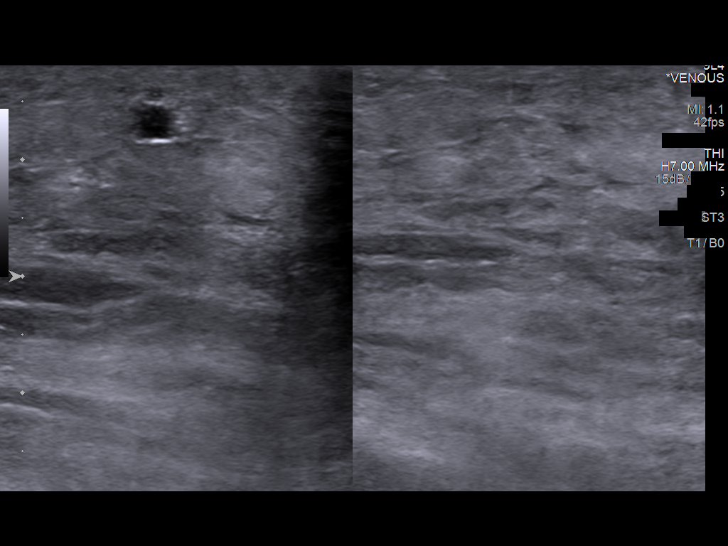
[im 21/21]
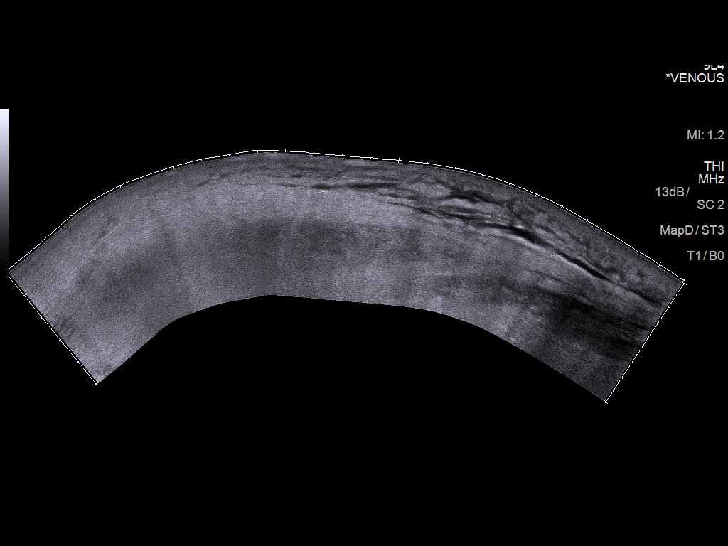

[13 of 21 positions shown; findings below may reference images not displayed]

FINDINGS: Contralateral Common Femoral Vein: Respiratory phasicity is normal
and symmetric with the symptomatic side. No evidence of thrombus.
Normal compressibility.

Common Femoral Vein: No evidence of thrombus. Normal
compressibility, respiratory phasicity and response to augmentation.

Saphenofemoral Junction: No evidence of thrombus. Normal
compressibility and flow on color Doppler imaging.

Profunda Femoral Vein: No evidence of thrombus. Normal
compressibility and flow on color Doppler imaging.

Femoral Vein: Hypoechoic occlusive thrombus identified within the
RIGHT femoral vein. Absent spontaneous venous flow. Impaired
compressibility.

Popliteal Vein: Occlusive hypoechoic thrombus in RIGHT popliteal
vein. Impaired compressibility. Absent spontaneous venous flow.

Calf Veins: Peroneal vein not visualized. Posterior tibial vein
patent.

Superficial Great Saphenous Vein: No evidence of thrombus. Normal
compressibility.

Venous Reflux:  None.

Other Findings:  None.
IMPRESSION: Acute appearing deep venous thrombosis within the RIGHT femoral and
popliteal veins.

## 2020-09-22 ENCOUNTER — Telehealth: Payer: Self-pay

## 2020-09-22 NOTE — Telephone Encounter (Signed)
I connected by phone with Daniel [name] and/or patient's caregiver on 09/22/2020 at 12:38 PM to discuss the potential vaccination through our Homebound vaccination initiative.   Prevaccination Checklist for COVID-19 Vaccines  1.  Are you feeling sick today? no  2.  Have you ever received a dose of a COVID-19 vaccine?  yes      If yes, which one? Moderna   How many dose of Covid-19 vaccine have your received and dates ? 3, 07/05/2019, 08/05/2019, not sure of date for booster, thinks it was in October.   Check all that apply: I live in a long-term care setting. no  I have been diagnosed with a medical condition(s). Please list: _______________________ (pertinent to homebound status)  I am a first responder. no  I work in a long-term care facility, correctional facility, hospital, restaurant, retail setting, school, or other setting with high exposure to the public. no  4. Do you have a health condition or are you undergoing treatment that makes you moderately or severely immunocompromised? (This would include treatment for cancer or HIV, receipt of organ transplant, immunosuppressive therapy or high-dose corticosteroids, CAR-T-cell therapy, hematopoietic cell transplant [HCT], DiGeorge syndrome or Wiskott-Aldrich syndrome)  no  5. Have you received hematopoietic cell transplant (HCT) or CAR-T-cell therapies since receiving COVID-19 vaccine? no  6.  Have you ever had an allergic reaction: (This would include a severe reaction [ e.g., anaphylaxis] that required treatment with epinephrine or EpiPen or that caused you to go to the hospital.  It would also include an allergic reaction that occurred within 4 hours that caused hives, swelling, or respiratory distress, including wheezing.) A.  A previous dose of COVID-19 vaccine. no  B.  A vaccine or injectable therapy that contains multiple components, one of which is a COVID-19 vaccine component, but it is not known which component elicited the immediate  reaction. no  C.  Are you allergic to polyethylene glycol? no  D. Are you allergic to Polysorbate, which is found in some vaccines, film coated tablets and intravenous steroids?  no   7.  Have you ever had an allergic reaction to another vaccine (other than COVID-19 vaccine) or an injectable medication? (This would include a severe reaction [ e.g., anaphylaxis] that required treatment with epinephrine or EpiPen or that caused you to go to the hospital.  It would also include an allergic reaction that occurred within 4 hours that caused hives, swelling, or respiratory distress, including wheezing.)  no   8.  Have you ever had a severe allergic reaction (e.g., anaphylaxis) to something other than a component of the COVID-19 vaccine, or any vaccine or injectable medication?  This would include food, pet, venom, environmental, or oral medication allergies.  no   Check all that apply to you:  Am a male between ages 23 and 12 years old  no  Women 55 through 66 years of age can receive any FDA-authorized or -approved COVID-19 vaccine. However, they should be informed of the rare but increased risk of thrombosis with thrombocytopenia syndrome (TTS) after receipt of the [employer] Vaccine and the availability of other FDA-authorized and -approved COVID-19 vaccines. People who had TTS after a first dose of Janssen vaccine should not receive a subsequent dose of Janssen product    Am a male between ages 85 and 82 years old  no Males 5 through 66 years of age may receive the correct formulation of Pfizer-BioNTech COVID-19 vaccine. Males 66 and older can receive any FDA-authorized or -approved  vaccine. However, people receiving an mRNA COVID-19 vaccine, especially males 16 through 66 years of age and their parents/legal representative (when relevant), should be informed of the risk of developing myocarditis (an inflammation of the heart muscle) or pericarditis (inflammation of the lining around the heart)  after receipt of an mRNA vaccine. The risk of developing either myocarditis or pericarditis after vaccination is low, and lower than the risk of myocarditis associated with SARS-CoV-2 infection in adolescents and adults. Vaccine recipients should be counseled about the need to seek care if symptoms of myocarditis or pericarditis develop after vaccination     Have a history of myocarditis or pericarditis  no Myocarditis or pericarditis after receipt of the first dose of an mRNA COVID-19 vaccine series but before administration of the second dose  Experts advise that people who develop myocarditis or pericarditis after a dose of an mRNA COVID-19 vaccine not receive a subsequent dose of any COVID-19 vaccine, until additional safety data are available.  Administration of a subsequent dose of COVID-19 vaccine before safety data are available can be considered in certain circumstances after the episode of myocarditis or pericarditis has completely resolved. Until additional data are available, some experts recommend a Alphonsa Overall COVID-19 vaccine be considered instead of an mRNA COVID-19 vaccine. Decisions about proceeding with a subsequent dose should include a conversation between the patient, their parent/legal representative (when relevant), and their clinical team, which may include a cardiologist.    Have been treated with monoclonal antibodies or convalescent serum to prevent or treat COVID-19  no Vaccination should be offered to people regardless of history of prior symptomatic or asymptomatic SARS-CoV-2 infection. There is no recommended minimal interval between infection and vaccination.  However, vaccination should be deferred if a patient received monoclonal antibodies or convalescent serum as treatment for COVID-19 or for post-exposure prophylaxis. This is a precautionary measure until additional information becomes available, to avoid interference of the antibody treatment with vaccine-induced immune  responses.  Defer COVID-19 vaccination for 30 days when a passive antibody product was used for post-exposure prophylaxis.  Defer COVID-19 vaccination for 90 days when a passive antibody product was used to treat COVID-19.     Diagnosed with Multisystem Inflammatory Syndrome (MIS-C or MIS-A) after a COVID-19 infection  no It is unknown if people with a history of MIS-C or MIS-A are at risk for a dysregulated immune response to COVID-19 vaccination.  People with a history of MIS-C or MIS-A may choose to be vaccinated. Considerations for vaccination may include:   Clinical recovery from MIS-C or MIS-A, including return to normal cardiac function   Personal risk of severe acute COVID-19 (e.g., age, underlying conditions)   High or substantial community transmission of SARS-CoV-2 and personal increased risk of reinfection.   Timing of any immunomodulatory therapies (general best practice guidelines for immunization can be consulted for more information [email or web address])   It has been 90 days or more since their diagnosis of MIS-C   Onset of MIS-C occurred before any COVID-19 vaccination   A conversation between the patient, their guardian(s), and their clinical team or a specialist may assist with COVID-19 vaccination decisions. Healthcare providers and health departments may also request a consultation from the [place] at [email or web address].     Have a bleeding disorder  no Take a blood thinner  yes, patient reports he takes Lennette Bihari As with all vaccines, any COVID-19 vaccine product may be given to these patients, if a physician familiar with the patient's bleeding  risk determines that the vaccine can be administered intramuscularly with reasonable safety.  ACIP recommends the following technique for intramuscular vaccination in patients with bleeding disorders  or taking blood thinners: a fine-gauge needle (23-gauge or smaller caliber) should be used for the vaccination, followed by firm pressure on the site, without rubbing, for at least 2 minutes.  People who regularly take aspirin or anticoagulants as part of their routine medications do not need to stop these medications prior to receipt of any COVID-19 vaccine.    Have a history of heparin-induced thrombocytopenia (HIT)  no Although the etiology of TTS associated with the Alphonsa Overall COVID-19 vaccine is unclear, it appears to be similar to another rare immune-mediated syndrome, heparin-induced thrombocytopenia (HIT). People with a history of an episode of an immune-mediated syndrome characterized by thrombosis and thrombocytopenia, such as HIT, should be offered a currently FDA-approved or FDA-authorized mRNA COVID-19 vaccine if it has been ?90 days since their TTS resolved. After 90 days, patients may be vaccinated with any currently FDA-approved or FDA-authorized COVID-19 vaccine, including Janssen COVID-19 Vaccine. However, people who developed TTS after their initial Alphonsa Overall vaccine should not receive a Janssen booster dose.  Experts believe the following factors do not make people more susceptible to TTS after receipt of the [employer]. People with these conditions can be vaccinated with any FDA-authorized or - approved COVID-19 vaccine, including the [employer] COVID-19 Vaccine:   A prior history of venous thromboembolism   Risk factors for venous thromboembolism (e.g., inherited or acquired thrombophilia including Factor V Leiden; prothrombin gene 20210A mutation; antiphospholipid syndrome; protein C, protein S or antithrombin deficiency   A prior history of other types of thromboses not associated with thrombocytopenia   Pregnancy, post-partum status, or receipt of hormonal contraceptives (e.g., combined oral contraceptives, patch, ring)   Additional recipient education materials can be  found at [email or web address]-[email or web address]/ vaccines/safety/JJUpdate.html.    Am currently pregnant or breastfeeding  no Vaccination is recommended for all people aged 32 years and older, including people that are:   Pregnant   Breastfeeding   Trying to get pregnant now or who might become pregnant in the future   Pregnant, breastfeeding, and post-partum people 6 through 66 years of age should be aware of the rare risk of TTS after receipt of the Alphonsa Overall COVID-19 Vaccine and the availability of other FDA-authorized or -approved COVID-19 vaccines (i.e., mRNA vaccines).    Have received dermal fillers  no FDA-authorized or -approved COVID-19 vaccines can be administered to people who have received injectable dermal fillers who have no contraindications for vaccination.  Infrequently, these people might experience temporary swelling at or near the site of filler injection (usually the face or lips) following administration of a dose of an mRNA COVID-19 vaccine. These people should be advised to contact their healthcare provider if swelling develops at or near the site of dermal filler following vaccination.     Have a history of Guillain-Barr Syndrome (GBS)  no People with a history of GBS can receive any FDA-authorized or -approved COVID-19 vaccine. However, given the possible association between the [employer] and an increased risk of GBS, a patient with a history of GBS and their clinical team should discuss the availability of mRNA vaccines to offer protection against COVID-19. The highest risk has been observed in men aged 6-64 years with symptoms of GBS beginning within 42 days after Alphonsa Overall COVID-19 vaccination.  People who had GBS after receiving Janssen vaccine should be made aware of the option to  receive an mRNA COVID-19 vaccine booster at least 2 months (8 weeks) after the Janssen dose. However, Alphonsa Overall vaccine may be used as a booster, particularly if GBS occurred more  than 42 days after vaccination or was related to a non-vaccine factor. Prior to booster vaccination, a conversation between the patient and their clinical team may assist with decisions about use of a COVID-19 booster dose, including the timing of administration     Postvaccination Observation Times for People without Contraindications to Covid 19 Vaccination.  30 minutes:  People with a history of: A contraindication to another type of COVID-19 vaccine product (i.e., mRNA or viral vector COVID-19 vaccines)   Immediate (within 4 hours of exposure) non-severe allergic reaction to a COVID-19 vaccine or injectable therapies   Anaphylaxis due to any cause   Immediate allergic reaction of any severity to a non-COVID-19 vaccine   15 minutes: All other people  This patient is a 65 y.o. male that meets the FDA criteria to receive homebound vaccination. Patient or parent/caregiver understands they have the option to accept or refuse homebound vaccination.  Patient passed the pre-screening checklist and would like to proceed with homebound vaccination.  Based on questionnaire above, I recommend the patient be observed for 15 minutes.  There are an estimated #1 other household members/caregivers who are also interested in receiving the vaccine.    The patient has been confirmed homebound and eligible for homebound vaccination with the considerations outlined above. I will send the patient's information to our scheduling team who will reach out to schedule the patient and potential caregiver/family members for homebound vaccination.    Dan Humphreys 09/22/2020 12:38 PM

## 2020-09-28 ENCOUNTER — Telehealth: Payer: Self-pay

## 2020-10-06 ENCOUNTER — Ambulatory Visit: Payer: Managed Care, Other (non HMO) | Attending: Critical Care Medicine

## 2020-10-06 ENCOUNTER — Other Ambulatory Visit: Payer: Self-pay

## 2020-10-06 DIAGNOSIS — Z23 Encounter for immunization: Secondary | ICD-10-CM

## 2020-10-06 NOTE — Progress Notes (Signed)
   Covid-19 Vaccination Clinic  Name:  Jawanza Zambito    MRN: [id number] DOB: 1955-12-04  10/06/2020  Mr. Manfred was observed post Covid-19 immunization for 15 minutes without incident. He was provided with Vaccine Information Sheet and instruction to access the V-Safe system.   Mr. Donald was instructed to call 911 with any severe reactions post vaccine: Difficulty breathing  Swelling of face and throat  A fast heartbeat  A bad rash all over body  Dizziness and weakness   Immunizations Administered     Name Date Dose VIS Date Route   Moderna Covid-19 Booster Vaccine 10/06/2020 12:18 PM 0.25 mL 02/11/2020 Intramuscular   Manufacturer: Moderna   Lot: [id number]   [place]: [id number]

## 2021-01-25 ENCOUNTER — Ambulatory Visit: Payer: Medicare PPO | Attending: *Deleted | Admitting: Physical Therapy

## 2021-01-25 ENCOUNTER — Encounter: Payer: Self-pay | Admitting: Physical Therapy

## 2021-01-25 ENCOUNTER — Other Ambulatory Visit: Payer: Self-pay

## 2021-01-25 DIAGNOSIS — R262 Difficulty in walking, not elsewhere classified: Secondary | ICD-10-CM | POA: Insufficient documentation

## 2021-01-25 DIAGNOSIS — M6281 Muscle weakness (generalized): Secondary | ICD-10-CM | POA: Insufficient documentation

## 2021-01-25 NOTE — Therapy (Signed)
[address] [place], [place], [address] Phone: [phone]   Fax:  [phone]  Physical Therapy Evaluation  Patient Details  Name: Daniel [name] MRN: [id number] Date of Birth: 1955/05/06 Referring Provider (PT): Repass   Encounter Date: 01/25/2021   PT End of Session - 01/25/21 1608     Visit Number 1    Number of Visits 13    Date for PT Re-Evaluation 03/04/21    Authorization Type [employer]    PT Start Time 1528    PT Stop Time 1610    PT Time Calculation (min) 42 min    Activity Tolerance Patient tolerated treatment well    Behavior During Therapy [facility] for tasks assessed/performed             Past Medical History:  Diagnosis Date   Arthritis    Diabetes mellitus without complication ([place])    Elevated PSA 08/29/2012   7.22   Hyperlipidemia    Hypertension    Obstructive sleep apnea    CPAP   Prostate cancer [facility]) Feb 2014   Spinal stenosis     Past Surgical History:  Procedure Laterality Date   KNEE SURGERY     torn meniscus   PILONIDAL CYST EXCISION     lower spine age 49   PROSTATE BIOPSY Bilateral 08/29/2012   gleason 3+3=6  Dr.Marc Nesi   RADIOACTIVE SEED IMPLANT N/A 11/19/2012   Procedure: RADIOACTIVE SEED IMPLANT;  Surgeon: Hanley Ben, MD;  Location: [facility];  Service: Urology;  Laterality: N/A;   SPINAL CORD DECOMPRESSION     Spinal injection  2012   spinal stenosis   TONSILLECTOMY     as child age 40 or 5    There were no vitals filed for this visit.    Subjective Assessment - 01/25/21 1536     Subjective Patient underwent a multilevel lumbar laminectomy September 2021.  Reports that he did okay for a few days, then had significant nerve pain, returned to hospital, had a rehab stay, had significant spasms in the legs.  He has struggled with walking and movments on his own for the past year.  He reports ups and downs with home PT and other rehab with insurance issues.  Just  finished with home PT    Pertinent History DM, sleep apnea, HTN, prostate CA    Limitations Lifting;Standing;Walking;House hold activities    How long can you stand comfortably? 4 minutes    How long can you walk comfortably? 200 feet max fatiuge    Patient Stated Goals no device walking, get back to my life    Currently in Pain? No/denies    Pain Score 0-No pain    Pain Location Back    Pain Orientation Lower    Pain Descriptors / Indicators Sore;Aching    Aggravating Factors  morning, reports difficulty getting out of bed                [facility] PT Assessment - 01/25/21 0001       Assessment   Medical Diagnosis weakness, diff walking    Referring Provider (PT) Repass    Onset Date/Surgical Date 01/15/20    Prior Therapy rehab and home PT      Precautions   Precautions Fall      Balance Screen   Has the patient fallen in the past 6 months No    Has the patient had a decrease in activity level because of a  fear of falling?  Yes    Is the patient reluctant to leave their home because of a fear of falling?  Yes      Home Environment   Additional Comments has a ramp, some small steps in the home      Prior Function   Level of Independence Independent with basic ADLs    Vocation Retired    Leisure no exercise in years      ROM / Strength   AROM / PROM / Strength Strength      Strength   Strength Assessment Site Hip;Knee;Ankle    Right/Left Hip Right;Left    Right Hip Flexion 4-/5    Right Hip Extension 4-/5    Right Hip ABduction 4-/5    Left Hip Flexion 4-/5    Left Hip Extension 4-/5    Left Hip ABduction 4-/5    Right/Left Knee Right;Left    Right Knee Flexion 4-/5    Right Knee Extension 4-/5    Left Knee Flexion 4-/5    Left Knee Extension 4-/5    Right/Left Ankle Right;Left    Right Ankle Dorsiflexion 3+/5    Right Ankle Plantar Flexion 3+/5    Right Ankle Inversion 4-/5    Right Ankle Eversion 4-/5    Left Ankle Dorsiflexion 3+/5    Left Ankle  Plantar Flexion 3+/5    Left Ankle Inversion 4-/5    Left Ankle Eversion 4-/5      Palpation   Palpation comment non tender      Ambulation/Gait   Gait Comments uses a FWW, very slow, poor control of the LE's, tends to lean on the walker, forward flexed trunk      Standardized Balance Assessment   Standardized Balance Assessment Timed Up and Go Test      Timed Up and Go Test   Normal TUG (seconds) 35    TUG Comments with FWW                        Objective measurements completed on examination: See above findings.       [facility] PT Treatment/Exercise - 01/25/21 0001       Exercises   Exercises Knee/Hip      Knee/Hip Exercises: Aerobic   Nustep Level 5 x 6 minutes      Knee/Hip Exercises: Machines for Strengthening   Cybex Knee Extension 10# 2x10    Cybex Knee Flexion 35# 2x10                       PT Short Term Goals - 01/25/21 1619       PT SHORT TERM GOAL #1   Title give HEP that he will do at home    Time 3    Period Weeks    Status New               PT Long Term Goals - 01/25/21 1620       PT LONG TERM GOAL #1   Title independent with an advanced HEP    Time 12    Period Weeks    Status New      PT LONG TERM GOAL #2   Title decrease TUG time to 18 seconds    Time 12    Period Weeks    Status New      PT LONG TERM GOAL #3   Title walk with a cane or lessed  AD x 500 feet    Time 12    Period Weeks    Status New      PT LONG TERM GOAL #4   Title get up from sitting without using hands    Time 12    Period Weeks    Status New                    Plan - 01/25/21 1614     Clinical Impression Statement Patient reports that he started having LBP a number of years ago, he was having some LE weakness, he underwent a multilevel laminectomy September 2021.  He reports a lot of difficulty with his recovery, spasms, weakness and some insurance coverage issues,  He had some rehab in hospital and some  home PT.  He struggles to walk, uses a FWW, slow wtih forward flexed trunk, a lot of weight on the arms, wide base of support, TUG time 35 seconds, he is very weak in the LE's.  He reports unsure on his feet and has a fear of falling, 200 feet max walking distance and 4 minute max standing    Rehab Potential Good    PT Frequency 2x / week    PT Duration 12 weeks    PT Treatment/Interventions ADLs/Self Care Home Management;Gait training;Neuromuscular re-education;Balance training;Therapeutic exercise;Therapeutic activities;Functional mobility training;Stair training;Patient/family education;Manual techniques    PT Next Visit Plan start exercises for functional strength and balance    Consulted and Agree with Plan of Care Patient             Patient will benefit from skilled therapeutic intervention in order to improve the following deficits and impairments:  Abnormal gait, Decreased coordination, Decreased range of motion, Difficulty walking, Decreased endurance, Cardiopulmonary status limiting activity, Decreased activity tolerance, Decreased balance, Improper body mechanics, Postural dysfunction, Decreased strength, Decreased mobility  Visit Diagnosis: Muscle weakness (generalized) - Plan: PT plan of care cert/re-cert  Difficulty in walking, not elsewhere classified - Plan: PT plan of care cert/re-cert     Problem List Patient Active Problem List   Diagnosis Date Noted   Prostate cancer ([place]) 09/26/2012   DYSLIPIDEMIA 05/02/2007   MORBID OBESITY 05/02/2007   OBSTRUCTIVE SLEEP APNEA 05/02/2007   HYPERTENSION 05/02/2007    Sumner Boast, PT 01/25/2021, 4:58 PM  [address] [place], [place], [address] Phone: [phone]   Fax:  [phone]  Name: Daniel [name] MRN: [id number] Date of Birth: [birth year]

## 2021-01-27 ENCOUNTER — Ambulatory Visit: Payer: Medicare PPO | Admitting: Physical Therapy

## 2021-01-27 ENCOUNTER — Other Ambulatory Visit: Payer: Self-pay

## 2021-01-27 DIAGNOSIS — R262 Difficulty in walking, not elsewhere classified: Secondary | ICD-10-CM

## 2021-01-27 DIAGNOSIS — M6281 Muscle weakness (generalized): Secondary | ICD-10-CM | POA: Diagnosis not present

## 2021-01-27 NOTE — Therapy (Signed)
[place]. [place], [place], [address] Phone: [phone]   Fax:  [phone]  Physical Therapy Treatment  Patient Details  Name: Daniel [name] MRN: [id number] Date of Birth: 12/18/1955 Referring Provider (PT): Repass   Encounter Date: 01/27/2021   PT End of Session - 01/27/21 1611     Visit Number 2    Number of Visits 13    Date for PT Re-Evaluation 03/04/21    Authorization Type [employer]    PT Start Time 2876    PT Stop Time 1610    PT Time Calculation (min) 54 min             Past Medical History:  Diagnosis Date   Arthritis    Diabetes mellitus without complication ([place])    Elevated PSA 08/29/2012   7.22   Hyperlipidemia    Hypertension    Obstructive sleep apnea    CPAP   Prostate cancer [facility]) Feb 2014   Spinal stenosis     Past Surgical History:  Procedure Laterality Date   KNEE SURGERY     torn meniscus   PILONIDAL CYST EXCISION     lower spine age 48   PROSTATE BIOPSY Bilateral 08/29/2012   gleason 3+3=6  Dr.Marc Nesi   RADIOACTIVE SEED IMPLANT N/A 11/19/2012   Procedure: RADIOACTIVE SEED IMPLANT;  Surgeon: Hanley Ben, MD;  Location: [facility];  Service: Urology;  Laterality: N/A;   SPINAL CORD DECOMPRESSION     Spinal injection  2012   spinal stenosis   TONSILLECTOMY     as child age 36 or 5    There were no vitals filed for this visit.   Subjective Assessment - 01/27/21 1522     Subjective amb in and stated felt it after last session but in a good way    Currently in Pain? No/denies                               [facility] Adult PT Treatment/Exercise - 01/27/21 0001       Ambulation/Gait   Gait Comments gait with LBQC and HHA 40 feet and then 20 feet min A      Knee/Hip Exercises: Aerobic   Nustep Level 5 x 6 minutes    Other Aerobic UBE L 3 2 min fwd and 2 min back      Knee/Hip Exercises: Machines for Strengthening   Cybex Knee Extension 15# 10 x  BIL, 10 # SL 10 each    Cybex Knee Flexion 35# 15 x BIL, 20# SL 10 each    Cybex Leg Press 80# 15x BIL,30# 10 each leg      Knee/Hip Exercises: Seated   Other Seated Knee/Hip Exercises walking with sports cord 30# 8x fwd/back with RW CGA    Sit to Sand without UE support   wt ball elevated mat                      PT Short Term Goals - 01/27/21 1613       PT SHORT TERM GOAL #1   Title give HEP that he will do at home    Status Achieved               PT Long Term Goals - 01/25/21 1620       PT LONG TERM GOAL #1   Title independent with an advanced HEP  Time 12    Period Weeks    Status New      PT LONG TERM GOAL #2   Title decrease TUG time to 18 seconds    Time 12    Period Weeks    Status New      PT LONG TERM GOAL #3   Title walk with a cane or lessed AD x 500 feet    Time 12    Period Weeks    Status New      PT LONG TERM GOAL #4   Title get up from sitting without using hands    Time 12    Period Weeks    Status New                   Plan - 01/27/21 1611     Clinical Impression Statement progressed ther ex. assistance getting on and off machines.progressed wt and SL ex. amb with LBQC with min A x 2 ( for saefty) and occasssional HHA. cuing with ex    PT Treatment/Interventions ADLs/Self Care Home Management;Gait training;Neuromuscular re-education;Balance training;Therapeutic exercise;Therapeutic activities;Functional mobility training;Stair training;Patient/family education;Manual techniques    PT Next Visit Plan progress exercises for functional strength and balance             Patient will benefit from skilled therapeutic intervention in order to improve the following deficits and impairments:  Abnormal gait, Decreased coordination, Decreased range of motion, Difficulty walking, Decreased endurance, Cardiopulmonary status limiting activity, Decreased activity tolerance, Decreased balance, Improper body mechanics, Postural  dysfunction, Decreased strength, Decreased mobility  Visit Diagnosis: Muscle weakness (generalized)  Difficulty in walking, not elsewhere classified     Problem List Patient Active Problem List   Diagnosis Date Noted   Prostate cancer ([place]) 09/26/2012   DYSLIPIDEMIA 05/02/2007   MORBID OBESITY 05/02/2007   OBSTRUCTIVE SLEEP APNEA 05/02/2007   HYPERTENSION 05/02/2007    Freada Twersky,[name], PTA 01/27/2021, 4:13 PM  [place]. [place], [place], [address] Phone: [phone]   Fax:  [phone]  Name: Kavaughn Faucett MRN: [id number] Date of Birth: [birth year]

## 2021-02-01 ENCOUNTER — Ambulatory Visit: Payer: Medicare PPO | Admitting: Physical Therapy

## 2021-02-01 ENCOUNTER — Other Ambulatory Visit: Payer: Self-pay

## 2021-02-01 DIAGNOSIS — M6281 Muscle weakness (generalized): Secondary | ICD-10-CM | POA: Diagnosis not present

## 2021-02-01 DIAGNOSIS — R262 Difficulty in walking, not elsewhere classified: Secondary | ICD-10-CM

## 2021-02-01 NOTE — Therapy (Signed)
[place]. [place], [place], [address] Phone: [phone]   Fax:  [phone]  Physical Therapy Treatment  Patient Details  Name: Daniel [name] MRN: [id number] Date of Birth: 1955-07-02 Referring Provider (PT): Repass   Encounter Date: 02/01/2021   PT End of Session - 02/01/21 1617     Visit Number 3    Number of Visits 13    Date for PT Re-Evaluation 03/04/21    Authorization Type [employer]    PT Start Time 3329    PT Stop Time 5188    PT Time Calculation (min) 52 min             Past Medical History:  Diagnosis Date   Arthritis    Diabetes mellitus without complication ([place])    Elevated PSA 08/29/2012   7.22   Hyperlipidemia    Hypertension    Obstructive sleep apnea    CPAP   Prostate cancer [facility]) Feb 2014   Spinal stenosis     Past Surgical History:  Procedure Laterality Date   KNEE SURGERY     torn meniscus   PILONIDAL CYST EXCISION     lower spine age 8   PROSTATE BIOPSY Bilateral 08/29/2012   gleason 3+3=6  Dr.Marc Nesi   RADIOACTIVE SEED IMPLANT N/A 11/19/2012   Procedure: RADIOACTIVE SEED IMPLANT;  Surgeon: Hanley Ben, MD;  [facility];  Service: Urology;  Laterality: N/A;   SPINAL CORD DECOMPRESSION     Spinal injection  2012   spinal stenosis   TONSILLECTOMY     as child age 79 or 5    There were no vitals filed for this visit.   Subjective Assessment - 02/01/21 1532     Subjective a good sore after last session                               [facility] Adult PT Treatment/Exercise - 02/01/21 0001       Ambulation/Gait   Gait Comments gait with LBQC and HHA 30 feet min A with cuing 2 x      Knee/Hip Exercises: Aerobic   Nustep L 5 7 min      Knee/Hip Exercises: Machines for Strengthening   Cybex Knee Extension 15# 15 x BIL, 10 # SL 10 each    Cybex Knee Flexion 35# 15 x BIL, 20# SL 10 each    Other Machine row 25# 10x , 35# 10x. Lats 35# 2 sets  10   black tband trunk ext 20x     Knee/Hip Exercises: Standing   Other Standing Knee Exercises ball toss on airex with RW min A with posterior lean   wt ball chest press,OH and rotation 10 each on airex min A   Other Standing Knee Exercises standing CG-min A 4 # UE ex 15 x each ( bicep curl,chest press,abd,flex and punches)      Knee/Hip Exercises: Seated   Sit to Sand 2 sets;5 reps;without UE support   wt ball press ,elevated mat.. used legs to stab                      PT Short Term Goals - 01/27/21 1613       PT SHORT TERM GOAL #1   Title give HEP that he will do at home    Status Achieved  PT Long Term Goals - 01/25/21 1620       PT LONG TERM GOAL #1   Title independent with an advanced HEP    Time 12    Period Weeks    Status New      PT LONG TERM GOAL #2   Title decrease TUG time to 18 seconds    Time 12    Period Weeks    Status New      PT LONG TERM GOAL #3   Title walk with a cane or lessed AD x 500 feet    Time 12    Period Weeks    Status New      PT LONG TERM GOAL #4   Title get up from sitting without using hands    Time 12    Period Weeks    Status New                   Plan - 02/01/21 1617     Clinical Impression Statement progressed ther ex adding in dynamic standing ex for balance and core.CG- min A. progressed gait with cuing to work on less ant and lat leans.    PT Treatment/Interventions ADLs/Self Care Home Management;Gait training;Neuromuscular re-education;Balance training;Therapeutic exercise;Therapeutic activities;Functional mobility training;Stair training;Patient/family education;Manual techniques    PT Next Visit Plan progress exercises for functional strength and balance             Patient will benefit from skilled therapeutic intervention in order to improve the following deficits and impairments:  Abnormal gait, Decreased coordination, Decreased range of motion, Difficulty walking,  Decreased endurance, Cardiopulmonary status limiting activity, Decreased activity tolerance, Decreased balance, Improper body mechanics, Postural dysfunction, Decreased strength, Decreased mobility  Visit Diagnosis: Difficulty in walking, not elsewhere classified  Muscle weakness (generalized)     Problem List Patient Active Problem List   Diagnosis Date Noted   Prostate cancer ([place]) 09/26/2012   DYSLIPIDEMIA 05/02/2007   MORBID OBESITY 05/02/2007   OBSTRUCTIVE SLEEP APNEA 05/02/2007   HYPERTENSION 05/02/2007    Yolandra Habig,[name], PTA 02/01/2021, 4:19 PM  [place]. [place], [place], [address] Phone: [phone]   Fax:  [phone]  Name: Daniel [name] MRN: [id number] Date of Birth: 11-18-1955

## 2021-02-03 ENCOUNTER — Other Ambulatory Visit: Payer: Self-pay

## 2021-02-03 ENCOUNTER — Ambulatory Visit: Payer: Medicare PPO | Admitting: Physical Therapy

## 2021-02-03 DIAGNOSIS — R262 Difficulty in walking, not elsewhere classified: Secondary | ICD-10-CM

## 2021-02-03 DIAGNOSIS — M6281 Muscle weakness (generalized): Secondary | ICD-10-CM | POA: Diagnosis not present

## 2021-02-03 NOTE — Therapy (Signed)
[place]. [place], [place], [address] Phone: [phone]   Fax:  [phone]  Physical Therapy Treatment  Patient Details  Name: Daniel [name] MRN: [id number] Date of Birth: [birth year] Referring Provider (PT): Repass   Encounter Date: 02/03/2021   PT End of Session - 02/03/21 1615     Visit Number 4    Number of Visits 13    Date for PT Re-Evaluation 03/04/21    Authorization Type [employer]    PT Start Time 6962    PT Stop Time 1610    PT Time Calculation (min) 53 min             Past Medical History:  Diagnosis Date   Arthritis    Diabetes mellitus without complication ([place])    Elevated PSA 08/29/2012   7.22   Hyperlipidemia    Hypertension    Obstructive sleep apnea    CPAP   Prostate cancer [facility]) Feb 2014   Spinal stenosis     Past Surgical History:  Procedure Laterality Date   KNEE SURGERY     torn meniscus   PILONIDAL CYST EXCISION     lower spine age 74   PROSTATE BIOPSY Bilateral 08/29/2012   gleason 3+3=6  Dr.Marc Nesi   RADIOACTIVE SEED IMPLANT N/A 11/19/2012   Procedure: RADIOACTIVE SEED IMPLANT;  Surgeon: Hanley Ben, MD;  [facility];  Service: Urology;  Laterality: N/A;   SPINAL CORD DECOMPRESSION     Spinal injection  2012   spinal stenosis   TONSILLECTOMY     as child age 80 or 5    There were no vitals filed for this visit.   Subjective Assessment - 02/03/21 1519     Subjective soreness and stiffness but doing good    Currently in Pain? No/denies                               [facility] Adult PT Treatment/Exercise - 02/03/21 0001       Ambulation/Gait   Gait Comments gait with LBQC and HHA 55feet min A with cuing 2 x for wt shift and slow gait vs rushing to increase stability. step too gait      Knee/Hip Exercises: Aerobic   Nustep L 5 8 min    Other Aerobic UBE L 3 3 min fwd and 3 min back      Knee/Hip Exercises: Machines for  Strengthening   Cybex Knee Extension 15# 2 sets 15    Cybex Knee Flexion 45# 2 sets 10 BIL    Cybex Leg Press 80# BIL 3 sets 10, 30# SL 10 each    Other Machine rows and lats 35# 2 sets 10   black tband trunk ext 20x     Knee/Hip Exercises: Standing   Other Standing Knee Exercises ball tap with wt bar      Knee/Hip Exercises: Seated   Other Seated Knee/Hip Exercises 5# chest press, OH press,bicep curl 15 each                       PT Short Term Goals - 01/27/21 1613       PT SHORT TERM GOAL #1   Title give HEP that he will do at home    Status Achieved               PT Long Term Goals - 02/03/21  [place] #1   Title independent with an advanced HEP    Status On-going      PT LONG TERM GOAL #2   Title decrease TUG time to 18 seconds    Baseline with RW 26 sec    Status On-going      PT LONG TERM GOAL #3   Title walk with a cane or lessed AD x 500 feet    Status On-going      PT LONG TERM GOAL #4   Title get up from sitting without using hands    Status On-going                   Plan - 02/03/21 1616     Clinical Impression Statement progressing with goals. TUG has improved. decreased righting reaction with gait and standing ex requiring cuing and assistance. working to progress gait but need to do at start of session when he is not so fatgued. step to gait and cued ot go slower,more control and [place] cane to wt bear through. continue to owrk on strength and endurance    PT Treatment/Interventions ADLs/Self Care Home Management;Gait training;Neuromuscular re-education;Balance training;Therapeutic exercise;Therapeutic activities;Functional mobility training;Stair training;Patient/family education;Manual techniques    PT Next Visit Plan progress exercises for functional strength and balance             Patient will benefit from skilled therapeutic intervention in order to improve the following deficits and impairments:   Abnormal gait, Decreased coordination, Decreased range of motion, Difficulty walking, Decreased endurance, Cardiopulmonary status limiting activity, Decreased activity tolerance, Decreased balance, Improper body mechanics, Postural dysfunction, Decreased strength, Decreased mobility  Visit Diagnosis: Muscle weakness (generalized)  Difficulty in walking, not elsewhere classified     Problem List Patient Active Problem List   Diagnosis Date Noted   Prostate cancer ([place]) 09/26/2012   DYSLIPIDEMIA 05/02/2007   MORBID OBESITY 05/02/2007   OBSTRUCTIVE SLEEP APNEA 05/02/2007   HYPERTENSION 05/02/2007    Daniel [name],[name], PTA 02/03/2021, 4:18 PM  [place]. [name], [place], [address] Phone: [phone]   Fax:  [phone]  Name: Daniel [name] MRN: [id number] Date of Birth: 04-23-1956

## 2021-02-08 ENCOUNTER — Encounter: Payer: Self-pay | Admitting: Physical Therapy

## 2021-02-08 ENCOUNTER — Ambulatory Visit: Payer: Medicare PPO | Admitting: Physical Therapy

## 2021-02-08 ENCOUNTER — Other Ambulatory Visit: Payer: Self-pay

## 2021-02-08 DIAGNOSIS — M6281 Muscle weakness (generalized): Secondary | ICD-10-CM | POA: Diagnosis not present

## 2021-02-08 DIAGNOSIS — R262 Difficulty in walking, not elsewhere classified: Secondary | ICD-10-CM

## 2021-02-08 NOTE — Therapy (Signed)
[place]. [place], [place], [address] Phone: [phone]   Fax:  [phone]  Physical Therapy Treatment  Patient Details  Name: Daniel [name] MRN: [id number] Date of Birth: 14-Jun-1955 Referring Provider (PT): Repass   Encounter Date: 02/08/2021   PT End of Session - 02/08/21 1555     Visit Number 5    Number of Visits 13    Date for PT Re-Evaluation 03/04/21    Authorization Type [employer]    PT Start Time 1500    PT Stop Time 1555    PT Time Calculation (min) 55 min    Activity Tolerance Patient tolerated treatment well    Behavior During Therapy [facility] for tasks assessed/performed             Past Medical History:  Diagnosis Date   Arthritis    Diabetes mellitus without complication ([place])    Elevated PSA 08/29/2012   7.22   Hyperlipidemia    Hypertension    Obstructive sleep apnea    CPAP   Prostate cancer [facility]) Feb 2014   Spinal stenosis     Past Surgical History:  Procedure Laterality Date   KNEE SURGERY     torn meniscus   PILONIDAL CYST EXCISION     lower spine age 58   PROSTATE BIOPSY Bilateral 08/29/2012   gleason 3+3=6  Dr.Marc Nesi   RADIOACTIVE SEED IMPLANT N/A 11/19/2012   Procedure: RADIOACTIVE SEED IMPLANT;  Surgeon: Hanley Ben, MD;  [facility];  Service: Urology;  Laterality: N/A;   SPINAL CORD DECOMPRESSION     Spinal injection  2012   spinal stenosis   TONSILLECTOMY     as child age 54 or 5    There were no vitals filed for this visit.   Subjective Assessment - 02/08/21 1509     Subjective soreness and stiffness    Currently in Pain? No/denies                               [facility] Adult PT Treatment/Exercise - 02/08/21 0001       Ambulation/Gait   Gait Comments 3 steps forwads 3 steps backwards without AD      High Level Balance   High Level Balance Activities Side stepping    High Level Balance Comments infront of mat table       Exercises   Exercises Lumbar      Lumbar Exercises: Standing   Row Strengthening;Theraband;15 reps;Both   x2   Theraband Level (Row) Level 4 (Blue)    Shoulder Extension Strengthening;Both;20 reps;Theraband   x2   Theraband Level (Shoulder Extension) Level 4 (Blue)      Knee/Hip Exercises: Aerobic   Nustep L 5 44min    Other Aerobic UBE L 3 3 min fwd and 3 min back      Knee/Hip Exercises: Machines for Strengthening   Cybex Leg Press 90# BIL 2 sets 10, 50# SL 5 each      Knee/Hip Exercises: Standing   Other Standing Knee Exercises Standing march HHA x1 2x10, OHP blue ball 2x10      Knee/Hip Exercises: Seated   Sit to Sand --   elevated mat table                      PT Short Term Goals - 01/27/21 1613       PT SHORT TERM  GOAL #1   Title give HEP that he will do at home    Status Achieved               PT Long Term Goals - 02/03/21 1556       PT LONG TERM GOAL #1   Title independent with an advanced HEP    Status On-going      PT LONG TERM GOAL #2   Title decrease TUG time to 18 seconds    Baseline with RW 26 sec    Status On-going      PT LONG TERM GOAL #3   Title walk with a cane or lessed AD x 500 feet    Status On-going      PT LONG TERM GOAL #4   Title get up from sitting without using hands    Status On-going                   Plan - 02/08/21 1556     Clinical Impression Statement Pt progress with mobility. pt able to side step the length of mat table as well as take a few forward and backward steps with AD. Most of session performs in the standing position to aid with standing tolerance. Pt does have a hard time with advancing LE without UE support. Bilat hip weakness noted through session with some trendelenburg. Pt progresses with sit to stands without UE assist, continues this for the remainder of treatment when asked to stand.    Rehab Potential Good    PT Frequency 2x / week    PT Duration 12 weeks    PT  Treatment/Interventions ADLs/Self Care Home Management;Gait training;Neuromuscular re-education;Balance training;Therapeutic exercise;Therapeutic activities;Functional mobility training;Stair training;Patient/family education;Manual techniques    PT Next Visit Plan progress exercises for functional strength and balance, step ups             Patient will benefit from skilled therapeutic intervention in order to improve the following deficits and impairments:  Abnormal gait, Decreased coordination, Decreased range of motion, Difficulty walking, Decreased endurance, Cardiopulmonary status limiting activity, Decreased activity tolerance, Decreased balance, Improper body mechanics, Postural dysfunction, Decreased strength, Decreased mobility  Visit Diagnosis: Difficulty in walking, not elsewhere classified  Muscle weakness (generalized)     Problem List Patient Active Problem List   Diagnosis Date Noted   Prostate cancer ([place]) 09/26/2012   DYSLIPIDEMIA 05/02/2007   MORBID OBESITY 05/02/2007   OBSTRUCTIVE SLEEP APNEA 05/02/2007   HYPERTENSION 05/02/2007    [name], PTA 02/08/2021, 4:05 PM  [place]. [place], [place], [address] Phone: [phone]   Fax:  [phone]  Name: Daniel [name] MRN: [id number] Date of Birth: May 28, 1955

## 2021-02-10 ENCOUNTER — Encounter: Payer: Self-pay | Admitting: Physical Therapy

## 2021-02-10 ENCOUNTER — Ambulatory Visit: Payer: Medicare PPO | Admitting: Physical Therapy

## 2021-02-10 ENCOUNTER — Other Ambulatory Visit: Payer: Self-pay

## 2021-02-10 DIAGNOSIS — R262 Difficulty in walking, not elsewhere classified: Secondary | ICD-10-CM

## 2021-02-10 DIAGNOSIS — M6281 Muscle weakness (generalized): Secondary | ICD-10-CM

## 2021-02-10 NOTE — Therapy (Signed)
[place]. [place], [place], [address] Phone: [phone]   Fax:  [phone]  Physical Therapy Treatment  Patient Details  Name: Daniel [name] MRN: [id number] Date of Birth: July 04, 1955 Referring Provider (PT): Repass   Encounter Date: 02/10/2021   PT End of Session - 02/10/21 1554     Visit Number 6    Number of Visits 13    Date for PT Re-Evaluation 03/04/21    Authorization Type [employer]    PT Start Time 0175    PT Stop Time 1555    PT Time Calculation (min) 57 min    Activity Tolerance Patient tolerated treatment well    Behavior During Therapy [facility] for tasks assessed/performed             Past Medical History:  Diagnosis Date   Arthritis    Diabetes mellitus without complication ([place])    Elevated PSA 08/29/2012   7.22   Hyperlipidemia    Hypertension    Obstructive sleep apnea    CPAP   Prostate cancer [facility]) Feb 2014   Spinal stenosis     Past Surgical History:  Procedure Laterality Date   KNEE SURGERY     torn meniscus   PILONIDAL CYST EXCISION     lower spine age 65   PROSTATE BIOPSY Bilateral 08/29/2012   gleason 3+3=6  Dr.Marc Nesi   RADIOACTIVE SEED IMPLANT N/A 11/19/2012   Procedure: RADIOACTIVE SEED IMPLANT;  Surgeon: Hanley Ben, MD;  [facility];  Service: Urology;  Laterality: N/A;   SPINAL CORD DECOMPRESSION     Spinal injection  2012   spinal stenosis   TONSILLECTOMY     as child age 65 or 65    There were no vitals filed for this visit.   Subjective Assessment - 02/10/21 1514     Subjective soreness and stiffness but not as bad    Currently in Pain? No/denies                               [facility] Adult PT Treatment/Exercise - 02/10/21 0001       High Level Balance   High Level Balance Activities Side stepping    High Level Balance Comments infront of mat table      Knee/Hip Exercises: Aerobic   Nustep L 5 57min    Other Aerobic UBE L 3  3 min fwd and 3 min back      Knee/Hip Exercises: Standing   Forward Step Up Both;2 sets;5 reps;Hand Hold: 0;Step Height: 4"   RW   Other Standing Knee Exercises Alt 4 in box taps WBQC 2x10      Knee/Hip Exercises: Seated   Long Arc Quad Strengthening;Both;2 sets;15 reps    Long Arc Quad Weight 5 lbs.    Marching Both;2 sets;10 reps    Marching Weights 5 lbs.    Sit to Sand 2 sets;10 reps;without UE support                       PT Short Term Goals - 01/27/21 1613       PT SHORT TERM GOAL #1   Title give HEP that he will do at home    Status Achieved               PT Long Term Goals - 02/03/21 1556       PT  LONG TERM GOAL #1   Title independent with an advanced HEP    Status On-going      PT LONG TERM GOAL #2   Title decrease TUG time to 18 seconds    Baseline with RW 26 sec    Status On-going      PT LONG TERM GOAL #3   Title walk with a cane or lessed AD x 500 feet    Status On-going      PT LONG TERM GOAL #4   Title get up from sitting without using hands    Status On-going                   Plan - 02/10/21 1556     Clinical Impression Statement Good carryover from last session despite pt reporting it was not a good day. Added step up interventions with RW to aid in LE strength. Step ups was taxing on pt. Multiple attempts required to reach the total number of sit to stands. No reports of pain. Cues for pacing and control needed with seated marches. Some instability with alt box taps LOB x1.    Rehab Potential Good    PT Frequency 2x / week    PT Duration 12 weeks    PT Treatment/Interventions ADLs/Self Care Home Management;Gait training;Neuromuscular re-education;Balance training;Therapeutic exercise;Therapeutic activities;Functional mobility training;Stair training;Patient/family education;Manual techniques    PT Next Visit Plan progress exercises for functional strength and balance, step ups             Patient will benefit  from skilled therapeutic intervention in order to improve the following deficits and impairments:  Abnormal gait, Decreased coordination, Decreased range of motion, Difficulty walking, Decreased endurance, Cardiopulmonary status limiting activity, Decreased activity tolerance, Decreased balance, Improper body mechanics, Postural dysfunction, Decreased strength, Decreased mobility  Visit Diagnosis: Muscle weakness (generalized)  Difficulty in walking, not elsewhere classified     Problem List Patient Active Problem List   Diagnosis Date Noted   Prostate cancer ([place]) 09/26/2012   DYSLIPIDEMIA 05/02/2007   MORBID OBESITY 05/02/2007   OBSTRUCTIVE SLEEP APNEA 05/02/2007   HYPERTENSION 05/02/2007    [name], PTA 02/10/2021, 3:59 PM  [place]. [place], [place], [address] Phone: [phone]   Fax:  [phone]  Name: Donyale Berthold MRN: [id number] Date of Birth: 05/11/1955

## 2021-02-15 ENCOUNTER — Ambulatory Visit: Payer: Medicare PPO | Admitting: Physical Therapy

## 2021-02-15 ENCOUNTER — Other Ambulatory Visit: Payer: Self-pay

## 2021-02-15 DIAGNOSIS — R262 Difficulty in walking, not elsewhere classified: Secondary | ICD-10-CM

## 2021-02-15 DIAGNOSIS — M6281 Muscle weakness (generalized): Secondary | ICD-10-CM | POA: Diagnosis not present

## 2021-02-15 NOTE — Therapy (Signed)
[place]. [place], [place], [address] Phone: [phone]   Fax:  [phone]  Physical Therapy Treatment  Patient Details  Name: Daniel [name] MRN: [id number] Date of Birth: 1956-02-23 Referring Provider (PT): Repass   Encounter Date: 02/15/2021   PT End of Session - 02/15/21 1452     Visit Number 7    Number of Visits 13    Date for PT Re-Evaluation 03/04/21    Authorization Type [employer]    PT Start Time 1345    PT Stop Time 9211    PT Time Calculation (min) 60 min             Past Medical History:  Diagnosis Date   Arthritis    Diabetes mellitus without complication ([place])    Elevated PSA 08/29/2012   7.22   Hyperlipidemia    Hypertension    Obstructive sleep apnea    CPAP   Prostate cancer [facility]) Feb 2014   Spinal stenosis     Past Surgical History:  Procedure Laterality Date   KNEE SURGERY     torn meniscus   PILONIDAL CYST EXCISION     lower spine age 39   PROSTATE BIOPSY Bilateral 08/29/2012   gleason 3+3=6  Dr.Marc Nesi   RADIOACTIVE SEED IMPLANT N/A 11/19/2012   Procedure: RADIOACTIVE SEED IMPLANT;  Surgeon: Hanley Ben, MD;  Location: [facility];  Service: Urology;  Laterality: N/A;   SPINAL CORD DECOMPRESSION     Spinal injection  2012   spinal stenosis   TONSILLECTOMY     as child age 29 or 5    There were no vitals filed for this visit.   Subjective Assessment - 02/15/21 1351     Subjective some RT inner thigh /groin pain with lying. walking more at home and trying to decrease w/c use    Currently in Pain? No/denies                               [facility] Adult PT Treatment/Exercise - 02/15/21 0001       Ambulation/Gait   Gait Comments tried amb with LBQC but only made 10 feet and then stopped d/t fatigue and L knee pain      Knee/Hip Exercises: Aerobic   Nustep L 6 73min    Other Aerobic standing L 2 2 min fwd/1 min backward   1 standing rest needed      Knee/Hip Exercises: Machines for Strengthening   Cybex Knee Extension 15# 1 sets 15. SL 10 each    Cybex Knee Flexion 45# 15 x. SL 35 # 10 each    Cybex Leg Press 90# BIL 2 sets 10, 50# SL 5 each      Knee/Hip Exercises: Standing   Forward Step Up Both;2 sets;5 sets;Hand Hold: 2;Step Height: 4"   min A with cuing and rails     Knee/Hip Exercises: Seated   Sit to Sand 2 sets;5 reps;without UE support   with wt ball toss                      PT Short Term Goals - 01/27/21 1613       PT SHORT TERM GOAL #1   Title give HEP that he will do at home    Status Achieved               PT Long Term  Goals - 02/03/21 1556       PT LONG TERM GOAL #1   Title independent with an advanced HEP    Status On-going      PT LONG TERM GOAL #2   Title decrease TUG time to 18 seconds    Baseline with RW 26 sec    Status On-going      PT LONG TERM GOAL #3   Title walk with a cane or lessed AD x 500 feet    Status On-going      PT LONG TERM GOAL #4   Title get up from sitting without using hands    Status On-going                   Plan - 02/15/21 1452     Clinical Impression Statement 4 inch step up are very difficult for pt. min a to stab L knee and cued to not lean fwd and pull with all UE. attempted gait after 30 min of ex and he was too tired and Left knee started hurting. STS cued to stay more fwd until knees get straighter as he tries to stand up too quick and throws wt backward. tried UBE standing but did increase LBP. continue to work on [place], gait and balance as he is showing improvements    PT Treatment/Interventions ADLs/Self Care Home Management;Gait training;Neuromuscular re-education;Balance training;Therapeutic exercise;Therapeutic activities;Functional mobility training;Stair training;Patient/family education;Manual techniques    PT Next Visit Plan progress exercises for functional strength and balance, step ups             Patient will  benefit from skilled therapeutic intervention in order to improve the following deficits and impairments:  Abnormal gait, Decreased coordination, Decreased range of motion, Difficulty walking, Decreased endurance, Cardiopulmonary status limiting activity, Decreased activity tolerance, Decreased balance, Improper body mechanics, Postural dysfunction, Decreased strength, Decreased mobility  Visit Diagnosis: Difficulty in walking, not elsewhere classified  Muscle weakness (generalized)     Problem List Patient Active Problem List   Diagnosis Date Noted   Prostate cancer ([place]) 09/26/2012   DYSLIPIDEMIA 05/02/2007   MORBID OBESITY 05/02/2007   OBSTRUCTIVE SLEEP APNEA 05/02/2007   HYPERTENSION 05/02/2007    Dinesh Ulysse,[name], PTA 02/15/2021, 2:55 PM  [place]. [place], [place], [address] Phone: [phone]   Fax:  [phone]  Name: Daniel [name] MRN: [id number] Date of Birth: [birth year]

## 2021-02-17 ENCOUNTER — Ambulatory Visit: Payer: Medicare PPO | Admitting: Physical Therapy

## 2021-02-17 ENCOUNTER — Other Ambulatory Visit: Payer: Self-pay

## 2021-02-17 DIAGNOSIS — R262 Difficulty in walking, not elsewhere classified: Secondary | ICD-10-CM

## 2021-02-17 DIAGNOSIS — M6281 Muscle weakness (generalized): Secondary | ICD-10-CM | POA: Diagnosis not present

## 2021-02-17 NOTE — Therapy (Signed)
[place]. [place], [place], [address] Phone: [phone]   Fax:  [phone]  Physical Therapy Treatment  Patient Details  Name: Daniel [name] MRN: [id number] Date of Birth: 14-Jul-1955 Referring Provider (PT): Repass   Encounter Date: 02/17/2021   PT End of Session - 02/17/21 1442     Visit Number 8    Number of Visits 13    Date for PT Re-Evaluation 03/04/21    Authorization Type [employer]    PT Start Time 1345    PT Stop Time 1440    PT Time Calculation (min) 55 min             Past Medical History:  Diagnosis Date   Arthritis    Diabetes mellitus without complication ([place])    Elevated PSA 08/29/2012   7.22   Hyperlipidemia    Hypertension    Obstructive sleep apnea    CPAP   Prostate cancer [facility]) Feb 2014   Spinal stenosis     Past Surgical History:  Procedure Laterality Date   KNEE SURGERY     torn meniscus   PILONIDAL CYST EXCISION     lower spine age 75   PROSTATE BIOPSY Bilateral 08/29/2012   gleason 3+3=6  Dr.Marc Nesi   RADIOACTIVE SEED IMPLANT N/A 11/19/2012   Procedure: RADIOACTIVE SEED IMPLANT;  Surgeon: Hanley Ben, MD;  [facility];  Service: Urology;  Laterality: N/A;   SPINAL CORD DECOMPRESSION     Spinal injection  2012   spinal stenosis   TONSILLECTOMY     as child age 57 or 5    There were no vitals filed for this visit.   Subjective Assessment - 02/17/21 1341     Subjective doing pretty good    Currently in Pain? No/denies                               [facility] Adult PT Treatment/Exercise - 02/17/21 0001       Ambulation/Gait   Gait Comments amb with LBQC and HHA 35 feet 2 times and 70 feet 1 x. with seated rest. good step through gait. working on more pressure through cane vs HHA      Lumbar Exercises: Aerobic   UBE (Upper Arm Bike) L 3 3 fwd/3 back      Lumbar Exercises: Standing   Other Standing Lumbar Exercises standing on airex-  ball toss and fucn reaching outside midline with ball min A with cuing      Lumbar Exercises: Seated   Sit to Stand 15 reps   3 sets 5     Knee/Hip Exercises: Aerobic   Nustep L 6 72min      Knee/Hip Exercises: Standing   Forward Step Up Both;2 sets;5 reps;Hand Hold: 2   4 inch min A     Knee/Hip Exercises: Seated   Other Seated Knee/Hip Exercises fitter flex and ext BIL 15 x                       PT Short Term Goals - 01/27/21 1613       PT SHORT TERM GOAL #1   Title give HEP that he will do at home    Status Achieved               PT Long Term Goals - 02/17/21 1342       PT  LONG TERM GOAL #1   Title independent with an advanced HEP    Status Achieved      PT LONG TERM GOAL #2   Title decrease TUG time to 18 seconds    Baseline with RW 25 sec ( slow on turns) slight elevated surface. 26.81 sec from low mat    Status Partially Met      PT LONG TERM GOAL #3   Title walk with a cane or lessed AD x 500 feet    Status On-going      PT LONG TERM GOAL #4   Title get up from sitting without using hands    Baseline varies on height and surface    Status On-going                   Plan - 02/17/21 1442     Clinical Impression Statement progressing strength and func. pt did well with gait with LBQC 35 feet 2 x and then 70 feet with HHA working on shifting more wt to cane vs HHA. better continuous steps today and strep thrpugh, a couple moments of instabilty but regained with min A. dynamic standing and 4 inch step both very difficult. progressing towards goals.    PT Treatment/Interventions ADLs/Self Care Home Management;Gait training;Neuromuscular re-education;Balance training;Therapeutic exercise;Therapeutic activities;Functional mobility training;Stair training;Patient/family education;Manual techniques    PT Next Visit Plan progress exercises for functional strength and balance, step ups             Patient will benefit from skilled  therapeutic intervention in order to improve the following deficits and impairments:  Abnormal gait, Decreased coordination, Decreased range of motion, Difficulty walking, Decreased endurance, Cardiopulmonary status limiting activity, Decreased activity tolerance, Decreased balance, Improper body mechanics, Postural dysfunction, Decreased strength, Decreased mobility  Visit Diagnosis: Difficulty in walking, not elsewhere classified  Muscle weakness (generalized)     Problem List Patient Active Problem List   Diagnosis Date Noted   Prostate cancer ([place]) 09/26/2012   DYSLIPIDEMIA 05/02/2007   MORBID OBESITY 05/02/2007   OBSTRUCTIVE SLEEP APNEA 05/02/2007   HYPERTENSION 05/02/2007    [name],[name], PTA 02/17/2021, 2:46 PM  [place]. [place], [place], [address] Phone: [phone]   Fax:  [phone]  Name: Daniel [name] MRN: [id number] Date of Birth: Sep 17, 1955

## 2021-02-22 ENCOUNTER — Ambulatory Visit: Payer: Medicare PPO | Attending: *Deleted | Admitting: Physical Therapy

## 2021-02-22 ENCOUNTER — Other Ambulatory Visit: Payer: Self-pay

## 2021-02-22 DIAGNOSIS — R262 Difficulty in walking, not elsewhere classified: Secondary | ICD-10-CM | POA: Diagnosis not present

## 2021-02-22 DIAGNOSIS — M6281 Muscle weakness (generalized): Secondary | ICD-10-CM | POA: Diagnosis not present

## 2021-02-22 NOTE — Therapy (Signed)
[place]. [place], [place], [address] Phone: [phone]   Fax:  [phone]  Physical Therapy Treatment  Patient Details  Name: Daniel [name] MRN: [id number] Date of Birth: 1955-05-24 Referring Provider (PT): Repass   Encounter Date: 02/22/2021   PT End of Session - 02/22/21 1553     Visit Number 9    Number of Visits 13    Date for PT Re-Evaluation 03/04/21    Authorization Type Hum[employer]    PT Start Time 1510    PT Stop Time 6962    PT Time Calculation (min) 48 min    Activity Tolerance Patient tolerated treatment well    Behavior During Therapy [facility] for tasks assessed/performed             Past Medical History:  Diagnosis Date   Arthritis    Diabetes mellitus without complication ([place])    Elevated PSA 08/29/2012   7.22   Hyperlipidemia    Hypertension    Obstructive sleep apnea    CPAP   Prostate cancer [facility]) Feb 2014   Spinal stenosis     Past Surgical History:  Procedure Laterality Date   KNEE SURGERY     torn meniscus   PILONIDAL CYST EXCISION     lower spine age 75   PROSTATE BIOPSY Bilateral 08/29/2012   gleason 3+3=6  Dr.Marc Nesi   RADIOACTIVE SEED IMPLANT N/A 11/19/2012   Procedure: RADIOACTIVE SEED IMPLANT;  Surgeon: Hanley Ben, MD;  [facility];  Service: Urology;  Laterality: N/A;   SPINAL CORD DECOMPRESSION     Spinal injection  2012   spinal stenosis   TONSILLECTOMY     as child age 33 or 5    There were no vitals filed for this visit.   Subjective Assessment - 02/22/21 1515     Subjective Just a little tired, some soreness after therapy    Currently in Pain? No/denies                               [facility] Adult PT Treatment/Exercise - 02/22/21 0001       High Level Balance   High Level Balance Activities Side stepping    High Level Balance Comments infront of mat table      Lumbar Exercises: Aerobic   UBE (Upper Arm Bike) L 3.5 3 fwd/3  back      Lumbar Exercises: Seated   Sit to Stand 10 reps   x2     Knee/Hip Exercises: Aerobic   Nustep L 6 18min      Knee/Hip Exercises: Standing   Forward Step Up Both;1 set;5 reps;Hand Hold: 2;Step Height: 4";Other (comment)   at stairs   Other Standing Knee Exercises Alt 4 in box taps WBQC 2x10                       PT Short Term Goals - 01/27/21 1613       PT SHORT TERM GOAL #1   Title give HEP that he will do at home    Status Achieved               PT Long Term Goals - 02/17/21 1342       PT LONG TERM GOAL #1   Title independent with an advanced HEP    Status Achieved      PT LONG TERM GOAL #  2   Title decrease TUG time to 18 seconds    Baseline with RW 25 sec ( slow on turns) slight elevated surface. 26.81 sec from low mat    Status Partially Met      PT LONG TERM GOAL #3   Title walk with a cane or lessed AD x 500 feet    Status On-going      PT LONG TERM GOAL #4   Title get up from sitting without using hands    Baseline varies on height and surface    Status On-going                   Plan - 02/22/21 1554     Clinical Impression Statement Continued to focus on functional mobility and endurance. Time spent doing sit to stands without UE support, progressing to lower surfaces. Step ups are very taxing on pt, heavy UE use needed when stepping up with LLE. Min assist needed with side steps for stability and step ups    Rehab Potential Good    PT Frequency 2x / week    PT Duration 12 weeks    PT Treatment/Interventions ADLs/Self Care Home Management;Gait training;Neuromuscular re-education;Balance training;Therapeutic exercise;Therapeutic activities;Functional mobility training;Stair training;Patient/family education;Manual techniques    PT Next Visit Plan progress exercises for functional strength and balance, step ups             Patient will benefit from skilled therapeutic intervention in order to improve the following  deficits and impairments:  Abnormal gait, Decreased coordination, Decreased range of motion, Difficulty walking, Decreased endurance, Cardiopulmonary status limiting activity, Decreased activity tolerance, Decreased balance, Improper body mechanics, Postural dysfunction, Decreased strength, Decreased mobility  Visit Diagnosis: Difficulty in walking, not elsewhere classified     Problem List Patient Active Problem List   Diagnosis Date Noted   Prostate cancer ([place]) 09/26/2012   DYSLIPIDEMIA 05/02/2007   MORBID OBESITY 05/02/2007   OBSTRUCTIVE SLEEP APNEA 05/02/2007   HYPERTENSION 05/02/2007    [name], PTA 02/22/2021, 4:02 PM  [place]. [place], [place], [address] Phone: [phone]   Fax:  [phone]  Name: Daniel [name] MRN: [id number] Date of Birth: 05/25/1955

## 2021-02-24 ENCOUNTER — Ambulatory Visit: Payer: Medicare PPO | Admitting: Physical Therapy

## 2021-02-24 DIAGNOSIS — H2513 Age-related nuclear cataract, bilateral: Secondary | ICD-10-CM | POA: Diagnosis not present

## 2021-02-24 DIAGNOSIS — H401131 Primary open-angle glaucoma, bilateral, mild stage: Secondary | ICD-10-CM | POA: Diagnosis not present

## 2021-02-25 ENCOUNTER — Ambulatory Visit: Payer: Medicare PPO | Admitting: Physical Therapy

## 2021-02-25 ENCOUNTER — Encounter: Payer: Self-pay | Admitting: Physical Therapy

## 2021-02-25 ENCOUNTER — Other Ambulatory Visit: Payer: Self-pay

## 2021-02-25 DIAGNOSIS — M6281 Muscle weakness (generalized): Secondary | ICD-10-CM | POA: Diagnosis not present

## 2021-02-25 DIAGNOSIS — R262 Difficulty in walking, not elsewhere classified: Secondary | ICD-10-CM | POA: Diagnosis not present

## 2021-02-25 NOTE — Therapy (Signed)
[place]. [place], [place], [address] Phone: [phone]   Fax:  [phone]  Physical Therapy Treatment  Patient Details  Name: Daniel [name] MRN: [id number] Date of Birth: 12-29-1955 Referring Provider (PT): Repass   Encounter Date: 02/25/2021   PT End of Session - 02/25/21 1143     Visit Number 10    Date for PT Re-Evaluation 03/04/21    Authorization Type [employer]    PT Start Time 1052    PT Stop Time 1143    PT Time Calculation (min) 51 min    Activity Tolerance Patient tolerated treatment well    Behavior During Therapy [facility] for tasks assessed/performed             Past Medical History:  Diagnosis Date   Arthritis    Diabetes mellitus without complication ([place])    Elevated PSA 08/29/2012   7.22   Hyperlipidemia    Hypertension    Obstructive sleep apnea    CPAP   Prostate cancer [facility]) Feb 2014   Spinal stenosis     Past Surgical History:  Procedure Laterality Date   KNEE SURGERY     torn meniscus   PILONIDAL CYST EXCISION     lower spine age 59   PROSTATE BIOPSY Bilateral 08/29/2012   gleason 3+3=6  Dr.Marc Nesi   RADIOACTIVE SEED IMPLANT N/A 11/19/2012   Procedure: RADIOACTIVE SEED IMPLANT;  Surgeon: Hanley Ben, MD;  [facility];  Service: Urology;  Laterality: N/A;   SPINAL CORD DECOMPRESSION     Spinal injection  2012   spinal stenosis   TONSILLECTOMY     as child age 37 or 5    There were no vitals filed for this visit.   Subjective Assessment - 02/25/21 1101     Subjective "good"    Currently in Pain? No/denies                               [facility] Adult PT Treatment/Exercise - 02/25/21 0001       Ambulation/Gait   Gait Comments amb with LBQC and HHA 35 feet 2 times with seated rest. good step through gait. working on more pressure through cane vs HHA. Pt does tend to become unstable as he fatigues.      Lumbar Exercises: Aerobic   UBE (Upper  Arm Bike) L 3.5 3 fwd/3 back      Lumbar Exercises: Seated   Sit to Stand 10 reps   x2 from mat tabel     Knee/Hip Exercises: Aerobic   Nustep L 6 93min      Knee/Hip Exercises: Standing   Other Standing Knee Exercises Alt 6 in box taps WBQC 2x10   min assist                      PT Short Term Goals - 01/27/21 1613       PT SHORT TERM GOAL #1   Title give HEP that he will do at home    Status Achieved               PT Long Term Goals - 02/25/21 1143       PT LONG TERM GOAL #1   Title independent with an advanced HEP    Status Achieved      PT LONG TERM GOAL #2   Title decrease TUG time to 18  seconds    Status Partially Met      PT LONG TERM GOAL #3   Title walk with a cane or lessed AD x 500 feet    Status On-going      PT LONG TERM GOAL #4   Title get up from sitting without using hands    Status Partially Met                   Plan - 02/25/21 1145     Clinical Impression Statement Good carryover from last session with sit to stands, pt was I with understanding sequencing. Decrease activity tolerance ambulating with SBQC, some instability with ambulation as he fatigues. Cue needed at time to control gait speed for safety and stability. Min assist needed for some reps of alt box taps using 6 inch box. Visible shaking noted when standing on airex.    Rehab Potential Good    PT Frequency 2x / week    PT Duration 12 weeks    PT Treatment/Interventions ADLs/Self Care Home Management;Gait training;Neuromuscular re-education;Balance training;Therapeutic exercise;Therapeutic activities;Functional mobility training;Stair training;Patient/family education;Manual techniques    PT Next Visit Plan progress exercises for functional strength and balance, step ups             Patient will benefit from skilled therapeutic intervention in order to improve the following deficits and impairments:  Abnormal gait, Decreased coordination, Decreased range  of motion, Difficulty walking, Decreased endurance, Cardiopulmonary status limiting activity, Decreased activity tolerance, Decreased balance, Improper body mechanics, Postural dysfunction, Decreased strength, Decreased mobility  Visit Diagnosis: Difficulty in walking, not elsewhere classified     Problem List Patient Active Problem List   Diagnosis Date Noted   Prostate cancer ([place]) 09/26/2012   DYSLIPIDEMIA 05/02/2007   MORBID OBESITY 05/02/2007   OBSTRUCTIVE SLEEP APNEA 05/02/2007   HYPERTENSION 05/02/2007    [name], PTA 02/25/2021, 11:50 AM  [place]. [place], [place], [address] Phone: [phone]   Fax:  [phone]  Name: Daniel [name] MRN: [id number] Date of Birth: 25-Feb-1956

## 2021-02-28 ENCOUNTER — Ambulatory Visit: Payer: Medicare PPO | Admitting: Physical Therapy

## 2021-02-28 DIAGNOSIS — H903 Sensorineural hearing loss, bilateral: Secondary | ICD-10-CM | POA: Diagnosis not present

## 2021-02-28 DIAGNOSIS — H9313 Tinnitus, bilateral: Secondary | ICD-10-CM | POA: Diagnosis not present

## 2021-03-02 ENCOUNTER — Ambulatory Visit: Payer: Medicare PPO | Admitting: Physical Therapy

## 2021-03-02 ENCOUNTER — Other Ambulatory Visit: Payer: Self-pay

## 2021-03-02 ENCOUNTER — Encounter: Payer: Self-pay | Admitting: Physical Therapy

## 2021-03-02 DIAGNOSIS — M6281 Muscle weakness (generalized): Secondary | ICD-10-CM

## 2021-03-02 DIAGNOSIS — R262 Difficulty in walking, not elsewhere classified: Secondary | ICD-10-CM

## 2021-03-02 NOTE — Therapy (Signed)
[place]. [place], [place], [address] Phone: [phone]   Fax:  [phone]  Physical Therapy Treatment  Patient Details  Name: Daniel [name] MRN: [id number] Date of Birth: 01-12-1956 Referring Provider (PT): Repass   Encounter Date: 03/02/2021   PT End of Session - 03/02/21 1503     Visit Number 11    Date for PT Re-Evaluation 03/04/21    Authorization Type [employer]    PT Start Time 1414    PT Stop Time 1513    PT Time Calculation (min) 59 min    Activity Tolerance Patient tolerated treatment well    Behavior During Therapy [facility] for tasks assessed/performed             Past Medical History:  Diagnosis Date   Arthritis    Diabetes mellitus without complication ([place])    Elevated PSA 08/29/2012   7.22   Hyperlipidemia    Hypertension    Obstructive sleep apnea    CPAP   Prostate cancer [facility]) Feb 2014   Spinal stenosis     Past Surgical History:  Procedure Laterality Date   KNEE SURGERY     torn meniscus   PILONIDAL CYST EXCISION     lower spine age 61   PROSTATE BIOPSY Bilateral 08/29/2012   gleason 3+3=6  Dr.Marc Nesi   RADIOACTIVE SEED IMPLANT N/A 11/19/2012   Procedure: RADIOACTIVE SEED IMPLANT;  Surgeon: Hanley Ben, MD;  [facility];  Service: Urology;  Laterality: N/A;   SPINAL CORD DECOMPRESSION     Spinal injection  2012   spinal stenosis   TONSILLECTOMY     as child age 22 or 5    There were no vitals filed for this visit.   Subjective Assessment - 03/02/21 1431     Subjective "Doing ok"    Currently in Pain? No/denies                [facility] PT Assessment - 03/02/21 0001       Timed Up and Go Test   Normal TUG (seconds) 23.61                           [facility] Adult PT Treatment/Exercise - 03/02/21 0001       Ambulation/Gait   Gait Comments amb with LBQC and 70 feet. good step through gait. working on more pressure through cane      High  Level Balance   High Level Balance Activities Side stepping    High Level Balance Comments infront of mat table      Lumbar Exercises: Aerobic   UBE (Upper Arm Bike) L 3.5 3 fwd/3 back      Lumbar Exercises: Seated   Sit to Stand 10 reps   x2     Knee/Hip Exercises: Aerobic   Nustep L 6 44min      Knee/Hip Exercises: Standing   Other Standing Knee Exercises two step forward two back x2                       PT Short Term Goals - 01/27/21 1613       PT SHORT TERM GOAL #1   Title give HEP that he will do at home    Status Achieved               PT Long Term Goals - 03/02/21 1505  PT LONG TERM GOAL #2   Baseline with RW 23.61 sec ( slow on turns) slight elevated surface. 26.81 sec from low ma      PT LONG TERM GOAL #3   Title walk with a cane or lessed AD x 500 feet    Status On-going                   Plan - 03/02/21 1505     Clinical Impression Statement Pt has progressed decreasing his TUG time with RW. Pt also has increased his single trial gait distance with [facility]. Pt becomes very fatigue with ambulation small short steps with no toe off or heel strike, pt stated that his feet feels like blocks when walking. Some standing without AD pt tends to put most of his weight on the heels showing some instability. Time spent establishing the correct sequencing to complete sit to stands without UE or compensation.    Rehab Potential Good    PT Frequency 2x / week    PT Duration 12 weeks    PT Treatment/Interventions ADLs/Self Care Home Management;Gait training;Neuromuscular re-education;Balance training;Therapeutic exercise;Therapeutic activities;Functional mobility training;Stair training;Patient/family education;Manual techniques    PT Next Visit Plan progress exercises for functional strength and balance, step ups             Patient will benefit from skilled therapeutic intervention in order to improve the following deficits and  impairments:  Abnormal gait, Decreased coordination, Decreased range of motion, Difficulty walking, Decreased endurance, Cardiopulmonary status limiting activity, Decreased activity tolerance, Decreased balance, Improper body mechanics, Postural dysfunction, Decreased strength, Decreased mobility  Visit Diagnosis: Difficulty in walking, not elsewhere classified  Muscle weakness (generalized)     Problem List Patient Active Problem List   Diagnosis Date Noted   Prostate cancer ([place]) 09/26/2012   DYSLIPIDEMIA 05/02/2007   MORBID OBESITY 05/02/2007   OBSTRUCTIVE SLEEP APNEA 05/02/2007   HYPERTENSION 05/02/2007    [name], PTA 03/02/2021, 3:08 PM  [place]. [place], [place], [address] Phone: [phone]   Fax:  [phone]  Name: Daniel [name] MRN: [id number] Date of Birth: May 19, 1955

## 2021-03-05 ENCOUNTER — Emergency Department (HOSPITAL_BASED_OUTPATIENT_CLINIC_OR_DEPARTMENT_OTHER): Payer: Medicare PPO

## 2021-03-05 ENCOUNTER — Encounter (HOSPITAL_BASED_OUTPATIENT_CLINIC_OR_DEPARTMENT_OTHER): Payer: Self-pay | Admitting: *Deleted

## 2021-03-05 ENCOUNTER — Other Ambulatory Visit: Payer: Self-pay

## 2021-03-05 ENCOUNTER — Emergency Department (HOSPITAL_BASED_OUTPATIENT_CLINIC_OR_DEPARTMENT_OTHER)
Admission: EM | Admit: 2021-03-05 | Discharge: 2021-03-05 | Disposition: A | Discharge status: Home / Self Care | Payer: Medicare PPO | Attending: Student | Admitting: Student

## 2021-03-05 DIAGNOSIS — G5711 Meralgia paresthetica, right lower limb: Secondary | ICD-10-CM | POA: Diagnosis not present

## 2021-03-05 DIAGNOSIS — R1030 Lower abdominal pain, unspecified: Secondary | ICD-10-CM | POA: Insufficient documentation

## 2021-03-05 DIAGNOSIS — Z7984 Long term (current) use of oral hypoglycemic drugs: Secondary | ICD-10-CM | POA: Diagnosis not present

## 2021-03-05 DIAGNOSIS — E119 Type 2 diabetes mellitus without complications: Secondary | ICD-10-CM | POA: Diagnosis not present

## 2021-03-05 DIAGNOSIS — Z8546 Personal history of malignant neoplasm of prostate: Secondary | ICD-10-CM | POA: Diagnosis not present

## 2021-03-05 DIAGNOSIS — Z87891 Personal history of nicotine dependence: Secondary | ICD-10-CM | POA: Insufficient documentation

## 2021-03-05 DIAGNOSIS — I10 Essential (primary) hypertension: Secondary | ICD-10-CM | POA: Insufficient documentation

## 2021-03-05 DIAGNOSIS — M79604 Pain in right leg: Secondary | ICD-10-CM | POA: Diagnosis present

## 2021-03-05 DIAGNOSIS — Z0389 Encounter for observation for other suspected diseases and conditions ruled out: Secondary | ICD-10-CM | POA: Diagnosis not present

## 2021-03-05 MED ORDER — KETOROLAC TROMETHAMINE 15 MG/ML IJ SOLN
15.0000 mg | Freq: Once | INTRAMUSCULAR | Status: AC
  Administered 2021-03-05: 15 mg via INTRAMUSCULAR
  Filled 2021-03-05: qty 1

## 2021-03-05 NOTE — ED Notes (Signed)
EDP at [facility] 

## 2021-03-05 NOTE — ED Notes (Signed)
To ultrasound

## 2021-03-05 NOTE — ED Provider Notes (Signed)
[facility] Provider Note   CSN: [id number] Arrival date & time: 03/05/21  1056     History Chief Complaint  Patient presents with   Leg Pain    Daniel [name] is a 65 y.o. male with PMH diabetes, HTN, HLD, obesity, obstructive sleep apnea, prostate cancer, proximal right-sided DVT on Eliquis who presents the emergency department for evaluation of right lower extremity pain.  Patient states that last night he developed worsening right lateral thigh pain that radiates up into the hip.  Pain also radiates into the groin and is described as a burning sensation.  Patient recently had a laminectomy for right lower extremity weakness and spinal stenosis and has currently left his rehab facility but continues to work with PT.  He still uses a wheelchair frequently.  Denies saddle anesthesia, urinary retention, urinary or fecal incontinence or other red flag signs of back pain.  Denies use of tight compressive pants.   Leg Pain Associated symptoms: no back pain and no fever       Past Medical History:  Diagnosis Date   Arthritis    Diabetes mellitus without complication (HCC)    Elevated PSA 08/29/2012   7.22   Hyperlipidemia    Hypertension    Obstructive sleep apnea    CPAP   Prostate cancer [facility]) Feb 2014   Spinal stenosis     Patient Active Problem List   Diagnosis Date Noted   Prostate cancer ([place]) 09/26/2012   DYSLIPIDEMIA 05/02/2007   MORBID OBESITY 05/02/2007   OBSTRUCTIVE SLEEP APNEA 05/02/2007   HYPERTENSION 05/02/2007    Past Surgical History:  Procedure Laterality Date   KNEE SURGERY     torn meniscus   PILONIDAL CYST EXCISION     lower spine age 64   PROSTATE BIOPSY Bilateral 08/29/2012   gleason 3+3=6  Dr.Marc Nesi   RADIOACTIVE SEED IMPLANT N/A 11/19/2012   Procedure: RADIOACTIVE SEED IMPLANT;  Surgeon: Hanley Ben, MD;  Location: [facility];  Service: Urology;  Laterality: N/A;   SPINAL CORD DECOMPRESSION     Spinal injection   2012   spinal stenosis   TONSILLECTOMY     as child age 34 or 71       History reviewed. No pertinent family history.  Social History   Tobacco Use   Smoking status: Former    Packs/day: 0.50    Years: 10.00    Pack years: 5.00    Types: Cigarettes, Pipe, Cigars    Quit date: 09/25/1997    Years since quitting: 23.4   Smokeless tobacco: Never  Vaping Use   Vaping Use: Never used  Substance Use Topics   Alcohol use: Yes    Comment: 1-2 daily   Drug use: No    Home Medications Prior to Admission medications   Medication Sig Start Date End Date Taking? Authorizing Provider  Canagliflozin (INVOKANA) 300 MG TABS Take 300 mg by mouth every morning.     [provider]  clotrimazole-betamethasone (LOTRISONE) cream  11/25/12   [provider]  Colesevelam HCl [facility]) 3.75 G PACK Take 3.75 g by mouth daily.     [provider]  Exenatide (BYDUREON [place]) Inject into the skin once a week.    [provider]  furosemide (LASIX) 20 MG tablet Take 20 mg by mouth every morning.     [provider]  losartan-hydrochlorothiazide (HYZAAR) 100-25 MG per tablet Take 1 tablet by mouth every morning.     [provider]  metFORMIN (GLUCOPHAGE) 1000 MG tablet Take 1,000-2,000 mg by mouth 2 (two) times daily with a meal. Takes 2 tablets in the morning and 1 in the evening    [provider]  repaglinide (PRANDIN) 2 MG tablet Take 8 mg by mouth 2 (two) times daily before a meal.    [provider]  Rivaroxaban 15 & 20 MG TBPK Take as directed on package: Start with one 15mg  tablet by mouth twice a day with food. On Day 22, switch to one 20mg  tablet once a day with food. 06/05/18   Petrucelli, Glynda Jaeger, PA-C    Allergies    Patient has no known allergies.  Review of Systems   Review of Systems  Constitutional:  Negative for chills and fever.  HENT:  Negative for ear pain and sore throat.   Eyes:  Negative for pain and visual  disturbance.  Respiratory:  Negative for cough and shortness of breath.   Cardiovascular:  Negative for chest pain and palpitations.  Gastrointestinal:  Negative for abdominal pain and vomiting.  Genitourinary:  Negative for dysuria and hematuria.  Musculoskeletal:  Positive for arthralgias and myalgias. Negative for back pain.  Skin:  Negative for color change and rash.  Neurological:  Negative for seizures and syncope.  All other systems reviewed and are negative.  Physical Exam Updated Vital Signs BP (!) 160/83 (BP Location: Right Arm)   Pulse 98   Resp 20   Ht 6\' 2"  (1.88 m)   Wt (!) 163.3 kg   SpO2 98%   BMI 46.22 kg/m   Physical Exam Vitals and nursing note reviewed.  Constitutional:      Appearance: He is well-developed.  HENT:     Head: Normocephalic and atraumatic.  Eyes:     Conjunctiva/sclera: Conjunctivae normal.  Cardiovascular:     Rate and Rhythm: Normal rate and regular rhythm.     Heart sounds: No murmur heard. Pulmonary:     Effort: Pulmonary effort is normal. No respiratory distress.     Breath sounds: Normal breath sounds.  Abdominal:     Palpations: Abdomen is soft.     Tenderness: There is no abdominal tenderness.  Musculoskeletal:     Cervical back: Neck supple.  Skin:    General: Skin is warm and dry.  Neurological:     Mental Status: He is alert.    ED Results / Procedures / Treatments   Labs (all labs ordered are listed, but only abnormal results are displayed) Labs Reviewed - No data to display  EKG None  Radiology No results found.  Procedures Procedures   Medications Ordered in ED Medications  ketorolac (TORADOL) 15 MG/ML injection 15 mg (has no administration in time range)    ED Course  I have reviewed the triage vital signs and the nursing notes.  Pertinent labs & imaging results that were available during my care of the patient were reviewed by me and considered in my medical decision making (see chart for  details).    MDM Rules/Calculators/A&P                           Patient seen the emergency department for evaluation of right leg pain.  Physical exam is largely unremarkable.  Patient concerned about DVT and a DVT ultrasound was performed which is reassuringly negative.  Patient presentation consistent with meralgia paresthetica and he was encouraged to return to his 4 times daily dosing of  gabapentin to monitor for improvement.  He was then discharged with outpatient follow-up. Final Clinical Impression(s) / ED Diagnoses Final diagnoses:  None    Rx / DC Orders ED Discharge Orders     None        Donoven Pett, MD 03/05/21 1313

## 2021-03-05 NOTE — Discharge Instructions (Signed)
You are seen in the emergency department for evaluation of leg pain.  Your pain is concerning for something called meralgia paresthetica which is an irritation of the lateral femoral cutaneous nerve.  Your DVT ultrasound was negative today at this time you are safe for discharge.  Please take your gabapentin as prescribed and return the emergency department if you have new or worsening lower extremity weakness, pain, inability to urinate, involuntary urination or defecation.

## 2021-03-05 NOTE — ED Triage Notes (Addendum)
Here with family by POV for R leg, thigh, hip, buttocks pain. Ongoing over ~3 weeks. Worse last night. No relief with aleve, diclofenac, or muscle relaxers. H/o back surgery 1 year ago. Not sure if aggravated by PT. Ortho at [facility]. PT in [address]h/o DVT, takes xarelto. Uses walker.

## 2021-03-08 ENCOUNTER — Ambulatory Visit: Payer: Medicare PPO | Admitting: Physical Therapy

## 2021-03-08 ENCOUNTER — Encounter: Payer: Self-pay | Admitting: Physical Therapy

## 2021-03-08 ENCOUNTER — Other Ambulatory Visit: Payer: Self-pay

## 2021-03-08 DIAGNOSIS — M6281 Muscle weakness (generalized): Secondary | ICD-10-CM

## 2021-03-08 DIAGNOSIS — R262 Difficulty in walking, not elsewhere classified: Secondary | ICD-10-CM | POA: Diagnosis not present

## 2021-03-08 NOTE — Addendum Note (Signed)
Addended by: Sumner Boast on: 03/08/2021 05:25 PM   Modules accepted: Orders

## 2021-03-08 NOTE — Therapy (Signed)
[place]. [place], [place], [address] Phone: [phone]   Fax:  [phone]  Physical Therapy Treatment  Patient Details  Name: Daniel [name] MRN: [id number] Date of Birth: 27-Feb-1956 Referring Provider (PT): Repass   Encounter Date: 03/08/2021   PT End of Session - 03/08/21 1508     Visit Number 12    Date for PT Re-Evaluation 03/04/21    PT Start Time 9030    PT Stop Time 1510    PT Time Calculation (min) 55 min    Activity Tolerance Patient tolerated treatment well    Behavior During Therapy [facility] for tasks assessed/performed             Past Medical History:  Diagnosis Date   Arthritis    Diabetes mellitus without complication ([place])    Elevated PSA 08/29/2012   7.22   Hyperlipidemia    Hypertension    Obstructive sleep apnea    CPAP   Prostate cancer [facility]) Feb 2014   Spinal stenosis     Past Surgical History:  Procedure Laterality Date   KNEE SURGERY     torn meniscus   PILONIDAL CYST EXCISION     lower spine age 65   PROSTATE BIOPSY Bilateral 08/29/2012   gleason 3+3=6  Dr.Marc Nesi   RADIOACTIVE SEED IMPLANT N/A 11/19/2012   Procedure: RADIOACTIVE SEED IMPLANT;  Surgeon: Hanley Ben, MD;  [facility];  Service: Urology;  Laterality: N/A;   SPINAL CORD DECOMPRESSION     Spinal injection  2012   spinal stenosis   TONSILLECTOMY     as child age 29 or 5    There were no vitals filed for this visit.   Subjective Assessment - 03/08/21 1433     Subjective Went to ER Saturday morning cause he had a pain that was moving around. Reports having an agitated nerve. Today feeling tender in legs    Currently in Pain? No/denies                               [facility] Adult PT Treatment/Exercise - 03/08/21 0001       High Level Balance   High Level Balance Activities Side stepping    High Level Balance Comments --   small short steps     Lumbar Exercises: Aerobic    UBE (Upper Arm Bike) L 4 3 fwd/3 back      Lumbar Exercises: Standing   Row 20 reps;Both;Power tower;Strengthening    Row Limitations 15    Shoulder Extension 20 reps;Both;Strengthening;Power Tower    Shoulder Extension Limitations 10      Lumbar Exercises: Seated   Sit to Stand 10 reps   x2     Knee/Hip Exercises: Aerobic   Nustep L 6 9 min      Knee/Hip Exercises: Machines for Strengthening   Cybex Knee Flexion 45# 2x15      Knee/Hip Exercises: Standing   Other Standing Knee Exercises Standing marches with RW 2x10                       PT Short Term Goals - 01/27/21 1613       PT SHORT TERM GOAL #1   Title give HEP that he will do at home    Status Achieved               PT Long  Term Goals - 03/08/21 1509       PT LONG TERM GOAL #2   Title decrease TUG time to 18 seconds    Baseline with RW 23.61 sec ( slow on turns) slight elevated surface. 26.81 sec from low ma    Status Partially Met      PT LONG TERM GOAL #3   Title walk with a cane or lessed AD x 500 feet    Status On-going      PT LONG TERM GOAL #4   Title get up from sitting without using hands    Status Partially Met                   Plan - 03/08/21 1509     Clinical Impression Statement Pt with increase fatigue today and some instability when standing. Decrease standing tolerance noted overall. Cue not to rely on the resistance for stability with rows and extensions. No report of increase pain. Very slow short steps today with side steps    Rehab Potential Good    PT Frequency 2x / week    PT Duration 12 weeks    PT Treatment/Interventions ADLs/Self Care Home Management;Gait training;Neuromuscular re-education;Balance training;Therapeutic exercise;Therapeutic activities;Functional mobility training;Stair training;Patient/family education;Manual techniques    PT Next Visit Plan progress exercises for functional strength and balance, step ups             Patient will  benefit from skilled therapeutic intervention in order to improve the following deficits and impairments:  Abnormal gait, Decreased coordination, Decreased range of motion, Difficulty walking, Decreased endurance, Cardiopulmonary status limiting activity, Decreased activity tolerance, Decreased balance, Improper body mechanics, Postural dysfunction, Decreased strength, Decreased mobility  Visit Diagnosis: Difficulty in walking, not elsewhere classified  Muscle weakness (generalized)     Problem List Patient Active Problem List   Diagnosis Date Noted   Prostate cancer ([place]) 09/26/2012   DYSLIPIDEMIA 05/02/2007   MORBID OBESITY 05/02/2007   OBSTRUCTIVE SLEEP APNEA 05/02/2007   HYPERTENSION 05/02/2007    [name], PTA 03/08/2021, 3:13 PM  [place]. [place], [place], [address] Phone: [phone]   Fax:  [phone]  Name: Daniel [name] MRN: [id number] Date of Birth: 05-07-1955

## 2021-03-10 ENCOUNTER — Other Ambulatory Visit: Payer: Self-pay

## 2021-03-10 ENCOUNTER — Ambulatory Visit: Payer: Medicare PPO | Admitting: Physical Therapy

## 2021-03-10 ENCOUNTER — Encounter: Payer: Self-pay | Admitting: Physical Therapy

## 2021-03-10 DIAGNOSIS — R262 Difficulty in walking, not elsewhere classified: Secondary | ICD-10-CM

## 2021-03-10 DIAGNOSIS — M6281 Muscle weakness (generalized): Secondary | ICD-10-CM | POA: Diagnosis not present

## 2021-03-10 NOTE — Therapy (Signed)
[place]. [place], [place], [address] Phone: [phone]   Fax:  [phone]  Physical Therapy Treatment  Patient Details  Name: Daniel [name] MRN: [id number] Date of Birth: Jul 11, 1955 Referring Provider (PT): Repass   Encounter Date: 03/10/2021   PT End of Session - 03/10/21 1431     Visit Number 13    Authorization Type [employer]    PT Start Time 6063    PT Stop Time 1430    PT Time Calculation (min) 45 min    Activity Tolerance Patient tolerated treatment well    Behavior During Therapy [facility] for tasks assessed/performed             Past Medical History:  Diagnosis Date   Arthritis    Diabetes mellitus without complication ([place])    Elevated PSA 08/29/2012   7.22   Hyperlipidemia    Hypertension    Obstructive sleep apnea    CPAP   Prostate cancer [facility]) Feb 2014   Spinal stenosis     Past Surgical History:  Procedure Laterality Date   KNEE SURGERY     torn meniscus   PILONIDAL CYST EXCISION     lower spine age 15   PROSTATE BIOPSY Bilateral 08/29/2012   gleason 3+3=6  Dr.Marc Nesi   RADIOACTIVE SEED IMPLANT N/A 11/19/2012   Procedure: RADIOACTIVE SEED IMPLANT;  Surgeon: Hanley Ben, MD;  Location: [facility];  Service: Urology;  Laterality: N/A;   SPINAL CORD DECOMPRESSION     Spinal injection  2012   spinal stenosis   TONSILLECTOMY     as child age 52 or 5    There were no vitals filed for this visit.   Subjective Assessment - 03/10/21 1342     Subjective "Very good"    Currently in Pain? No/denies                               [facility] Adult PT Treatment/Exercise - 03/10/21 0001       Ambulation/Gait   Gait Comments amb with LBQC and 75 feet. good step through gait. working on more pressure through cane. one seated rest      Lumbar Exercises: Standing   Row Both;Power tower;Strengthening;15 reps   x2   Row Limitations 10    Other Standing Lumbar Exercises Alt  8in box taps w/ RW 2x10      Lumbar Exercises: Seated   Sit to Stand 10 reps   x2     Knee/Hip Exercises: Aerobic   Nustep L 6 7 min      Knee/Hip Exercises: Machines for Strengthening   Cybex Leg Press 90 x 15                       PT Short Term Goals - 01/27/21 1613       PT SHORT TERM GOAL #1   Title give HEP that he will do at home    Status Achieved               PT Long Term Goals - 03/10/21 1431       PT LONG TERM GOAL #1   Title independent with an advanced HEP    Status Achieved      PT LONG TERM GOAL #2   Title decrease TUG time to 18 seconds    Status Partially Met  Plan - 03/10/21 1432     Clinical Impression Statement Some instability with gait and other standing activities. Cue not to rely on cable to maintain balance with standing rows. Attempted alt box with SBQC but  unable to mat ain stability. Some R hip tenderness flexing with alt box taps. Pt continues to fatigue with activity.    Rehab Potential Good    PT Frequency 2x / week    PT Duration 12 weeks    PT Treatment/Interventions ADLs/Self Care Home Management;Gait training;Neuromuscular re-education;Balance training;Therapeutic exercise;Therapeutic activities;Functional mobility training;Stair training;Patient/family education;Manual techniques    PT Next Visit Plan progress exercises for functional strength and balance, step ups             Patient will benefit from skilled therapeutic intervention in order to improve the following deficits and impairments:  Abnormal gait, Decreased coordination, Decreased range of motion, Difficulty walking, Decreased endurance, Cardiopulmonary status limiting activity, Decreased activity tolerance, Decreased balance, Improper body mechanics, Postural dysfunction, Decreased strength, Decreased mobility  Visit Diagnosis: Difficulty in walking, not elsewhere classified  Muscle weakness  (generalized)     Problem List Patient Active Problem List   Diagnosis Date Noted   Prostate cancer ([place]) 09/26/2012   DYSLIPIDEMIA 05/02/2007   MORBID OBESITY 05/02/2007   OBSTRUCTIVE SLEEP APNEA 05/02/2007   HYPERTENSION 05/02/2007    [name], PTA 03/10/2021, 2:39 PM  [place]. [place], [place], [address] Phone: [phone]   Fax:  [phone]  Name: Antavious Spanos MRN: [id number] Date of Birth: Oct 25, 1955

## 2021-03-15 ENCOUNTER — Ambulatory Visit: Payer: Medicare PPO | Admitting: Physical Therapy

## 2021-03-15 ENCOUNTER — Encounter: Payer: Self-pay | Admitting: Physical Therapy

## 2021-03-15 ENCOUNTER — Other Ambulatory Visit: Payer: Self-pay

## 2021-03-15 DIAGNOSIS — R262 Difficulty in walking, not elsewhere classified: Secondary | ICD-10-CM | POA: Diagnosis not present

## 2021-03-15 DIAGNOSIS — M6281 Muscle weakness (generalized): Secondary | ICD-10-CM | POA: Diagnosis not present

## 2021-03-15 NOTE — Therapy (Signed)
[facility] [address] [place], [place], [address] Phone: [phone]   Fax:  [phone]  Physical Therapy Treatment  Patient Details  Name: Daniel [name] MRN: [id number] Date of Birth: 1956/02/09 Referring Provider (PT): Repass   Encounter Date: 03/15/2021   PT End of Session - 03/15/21 1509     Visit Number 14    Date for PT Re-Evaluation 04/23/21    Authorization Type [employer]    PT Start Time 1414    PT Stop Time 1509    PT Time Calculation (min) 55 min    Activity Tolerance Patient tolerated treatment well    Behavior During Therapy [facility] for tasks assessed/performed             Past Medical History:  Diagnosis Date   Arthritis    Diabetes mellitus without complication (HCC)    Elevated PSA 08/29/2012   7.22   Hyperlipidemia    Hypertension    Obstructive sleep apnea    CPAP   Prostate cancer [facility]) Feb 2014   Spinal stenosis     Past Surgical History:  Procedure Laterality Date   KNEE SURGERY     torn meniscus   PILONIDAL CYST EXCISION     lower spine age 48   PROSTATE BIOPSY Bilateral 08/29/2012   gleason 3+3=6  Dr.Marc Nesi   RADIOACTIVE SEED IMPLANT N/A 11/19/2012   Procedure: RADIOACTIVE SEED IMPLANT;  Surgeon: Lindaann Slough, MD;  [facility];  Service: Urology;  Laterality: N/A;   SPINAL CORD DECOMPRESSION     Spinal injection  2012   spinal stenosis   TONSILLECTOMY     as child age 87 or 5    There were no vitals filed for this visit.   Subjective Assessment - 03/15/21 1427     Subjective "I doing good" Got shots Thursday and it put him down Friday    Currently in Pain? No/denies                               [facility] Adult PT Treatment/Exercise - 03/15/21 0001       High Level Balance   High Level Balance Activities Side stepping    High Level Balance Comments infront of mat table      Lumbar Exercises: Aerobic   UBE (Upper Arm Bike) L 4 3 fwd/3 back       Lumbar Exercises: Standing   Other Standing Lumbar Exercises OHP bllue ball 2x15      Lumbar Exercises: Seated   Sit to Stand 15 reps      Knee/Hip Exercises: Aerobic   Nustep L 6 10 min      Knee/Hip Exercises: Machines for Strengthening   Cybex Knee Extension 20lb 2x10    Cybex Knee Flexion 45# 2x15      Knee/Hip Exercises: Standing   Hip Abduction Stengthening;Both;1 set;10 reps;Knee straight   RW   Other Standing Knee Exercises Standing marches with RW 2x10                       PT Short Term Goals - 01/27/21 1613       PT SHORT TERM GOAL #1   Title give HEP that he will do at home    Status Achieved               PT Long Term Goals - 03/15/21 1510  PT LONG TERM GOAL #1   Title independent with an advanced HEP    Status Achieved      PT LONG TERM GOAL #2   Title decrease TUG time to 18 seconds    Status Partially Met                   Plan - 03/15/21 1510     Clinical Impression Statement Session focus more on LE endurance and strength. Cue to complete full ROM with leg curls and extensions. Occasional leaning on the mat table with sit to stands to maintain balance. Pt continues to fatigue quick with standing activities. Some swaying with OHP.    Rehab Potential Good    PT Frequency 2x / week    PT Duration 12 weeks    PT Treatment/Interventions ADLs/Self Care Home Management;Gait training;Neuromuscular re-education;Balance training;Therapeutic exercise;Therapeutic activities;Functional mobility training;Stair training;Patient/family education;Manual techniques    PT Next Visit Plan progress exercises for functional strength and balance, step ups             Patient will benefit from skilled therapeutic intervention in order to improve the following deficits and impairments:  Abnormal gait, Decreased coordination, Decreased range of motion, Difficulty walking, Decreased endurance, Cardiopulmonary status limiting activity,  Decreased activity tolerance, Decreased balance, Improper body mechanics, Postural dysfunction, Decreased strength, Decreased mobility  Visit Diagnosis: Difficulty in walking, not elsewhere classified  Muscle weakness (generalized)     Problem List Patient Active Problem List   Diagnosis Date Noted   Prostate cancer ([place]) 09/26/2012   DYSLIPIDEMIA 05/02/2007   MORBID OBESITY 05/02/2007   OBSTRUCTIVE SLEEP APNEA 05/02/2007   HYPERTENSION 05/02/2007    Daniel [name], Daniel [name] 03/15/2021, 3:14 PM  [place]. [place], [place], [address] Phone: [phone]   Fax:  [phone]  Name: Daniel [name] MRN: [id number] Date of Birth: 28-[name]-1957

## 2021-03-22 ENCOUNTER — Other Ambulatory Visit: Payer: Self-pay

## 2021-03-22 ENCOUNTER — Encounter: Payer: Self-pay | Admitting: Physical Therapy

## 2021-03-22 ENCOUNTER — Ambulatory Visit: Payer: Medicare PPO | Admitting: Physical Therapy

## 2021-03-22 DIAGNOSIS — R262 Difficulty in walking, not elsewhere classified: Secondary | ICD-10-CM | POA: Diagnosis not present

## 2021-03-22 DIAGNOSIS — M6281 Muscle weakness (generalized): Secondary | ICD-10-CM | POA: Diagnosis not present

## 2021-03-22 NOTE — Therapy (Signed)
[place]. [place], [place], [address] Phone: [phone]   Fax:  [phone]  Physical Therapy Treatment  Patient Details  Name: Daniel [name] MRN: [id number] Date of Birth: Jun 23, 1955 Referring Provider (PT): Repass   Encounter Date: 03/22/2021   PT End of Session - 03/22/21 6387     Visit Number 15    Date for PT Re-Evaluation 04/23/21    Authorization Type [employer]    PT Start Time 1455    PT Stop Time 1555    PT Time Calculation (min) 60 min    Activity Tolerance Patient tolerated treatment well    Behavior During Therapy [facility] for tasks assessed/performed             Past Medical History:  Diagnosis Date   Arthritis    Diabetes mellitus without complication ([place])    Elevated PSA 08/29/2012   7.22   Hyperlipidemia    Hypertension    Obstructive sleep apnea    CPAP   Prostate cancer [facility]) Feb 2014   Spinal stenosis     Past Surgical History:  Procedure Laterality Date   KNEE SURGERY     torn meniscus   PILONIDAL CYST EXCISION     lower spine age 31   PROSTATE BIOPSY Bilateral 08/29/2012   gleason 3+3=6  Dr.Marc Nesi   RADIOACTIVE SEED IMPLANT N/A 11/19/2012   Procedure: RADIOACTIVE SEED IMPLANT;  Surgeon: Hanley Ben, MD;  Location: [facility];  Service: Urology;  Laterality: N/A;   SPINAL CORD DECOMPRESSION     Spinal injection  2012   spinal stenosis   TONSILLECTOMY     as child age 72 or 5    There were no vitals filed for this visit.   Subjective Assessment - 03/22/21 1509     Subjective "Pretty good"    Currently in Pain? No/denies                               [facility] Adult PT Treatment/Exercise - 03/22/21 0001       Ambulation/Gait   Ambulation/Gait Yes    Ambulation Distance (Feet) 600 Feet    Assistive device Rolling walker    Gait Pattern Step-through pattern    Ambulation Surface Level;Indoor    Gait Comments One seated rest      Lumbar  Exercises: Aerobic   UBE (Upper Arm Bike) L 4 3 fwd/3 back      Lumbar Exercises: Standing   Row Strengthening;Both;Theraband;20 reps    Theraband Level (Row) Level 4 (Blue)      Lumbar Exercises: Seated   Sit to Stand 5 reps   x 3 OHP yellow ball     Knee/Hip Exercises: Aerobic   Nustep L 6 10 min                       PT Short Term Goals - 01/27/21 1613       PT SHORT TERM GOAL #1   Title give HEP that he will do at home    Status Achieved               PT Long Term Goals - 03/22/21 1547       PT LONG TERM GOAL #1   Title independent with an advanced HEP    Status Achieved      PT LONG TERM GOAL #2   Title decrease  TUG time to 18 seconds    Status Partially Met      PT LONG TERM GOAL #3   Title walk with a cane or lessed AD x 500 feet    Status On-going      PT LONG TERM GOAL #4   Title get up from sitting without using hands    Status Achieved                   Plan - 03/22/21 1554     Clinical Impression Statement Heavy focus on gait increasing his functional endurance and mobility. Pt ambulated two 344ft gait trials with some fatigue ands knee pain. Pt stated that's both knees are bone on bone. Some  difficulty maintaining stability with sit to stands at time. Cues not to use Tband for balance with standing rows.    Rehab Potential Good    PT Frequency 2x / week    PT Duration 12 weeks    PT Next Visit Plan progress exercises for functional strength and balance, step ups             Patient will benefit from skilled therapeutic intervention in order to improve the following deficits and impairments:  Abnormal gait, Decreased coordination, Decreased range of motion, Difficulty walking, Decreased endurance, Cardiopulmonary status limiting activity, Decreased activity tolerance, Decreased balance, Improper body mechanics, Postural dysfunction, Decreased strength, Decreased mobility  Visit Diagnosis: Difficulty in walking, not  elsewhere classified  Muscle weakness (generalized)     Problem List Patient Active Problem List   Diagnosis Date Noted   Prostate cancer ([place]) 09/26/2012   DYSLIPIDEMIA 05/02/2007   MORBID OBESITY 05/02/2007   OBSTRUCTIVE SLEEP APNEA 05/02/2007   HYPERTENSION 05/02/2007    [name], PTA 03/22/2021, 3:58 PM  [place]. [place], [place], [address] Phone: [phone]   Fax:  [phone]  Name: Daniel [name] MRN: [id number] Date of Birth: [birth year]

## 2021-03-24 ENCOUNTER — Other Ambulatory Visit: Payer: Self-pay

## 2021-03-24 ENCOUNTER — Ambulatory Visit: Payer: Medicare PPO | Attending: *Deleted | Admitting: Physical Therapy

## 2021-03-24 DIAGNOSIS — M6281 Muscle weakness (generalized): Secondary | ICD-10-CM | POA: Insufficient documentation

## 2021-03-24 DIAGNOSIS — R262 Difficulty in walking, not elsewhere classified: Secondary | ICD-10-CM | POA: Diagnosis not present

## 2021-03-24 NOTE — Therapy (Signed)
[place]. [place], [place], [address] Phone: [phone]   Fax:  [phone]  Physical Therapy Treatment  Patient Details  Name: Daniel [name] MRN: [id number] Date of Birth: 1956/03/02 Referring Provider (PT): Repass   Encounter Date: 03/24/2021   PT End of Session - 03/24/21 1605     Visit Number 16    Date for PT Re-Evaluation 04/23/21    Authorization Type [employer]    PT Start Time 1520    PT Stop Time 9678    PT Time Calculation (min) 55 min             Past Medical History:  Diagnosis Date   Arthritis    Diabetes mellitus without complication ([place])    Elevated PSA 08/29/2012   7.22   Hyperlipidemia    Hypertension    Obstructive sleep apnea    CPAP   Prostate cancer [facility]) Feb 2014   Spinal stenosis     Past Surgical History:  Procedure Laterality Date   KNEE SURGERY     torn meniscus   PILONIDAL CYST EXCISION     lower spine age 68   PROSTATE BIOPSY Bilateral 08/29/2012   gleason 3+3=6  Dr.Marc Nesi   RADIOACTIVE SEED IMPLANT N/A 11/19/2012   Procedure: RADIOACTIVE SEED IMPLANT;  Surgeon: Hanley Ben, MD;  [facility];  Service: Urology;  Laterality: N/A;   SPINAL CORD DECOMPRESSION     Spinal injection  2012   spinal stenosis   TONSILLECTOMY     as child age 40 or 5    There were no vitals filed for this visit.   Subjective Assessment - 03/24/21 1533     Subjective doing status quo, PT is definately helping. if I could get up off sofa and chairs i coudl get rid of w/c    Currently in Pain? Yes    Pain Score 3     Pain Location Back                               [facility] Adult PT Treatment/Exercise - 03/24/21 0001       Lumbar Exercises: Aerobic   Tread Mill 3 min .5 mph      Lumbar Exercises: Standing   Other Standing Lumbar Exercises wt ball todd for balance      Knee/Hip Exercises: Aerobic   Nustep L 6 10 min      Knee/Hip Exercises: Standing    Forward Step Up Both;2 sets;5 reps   BIL UE assist. stepping over and back3 inch then 6 inch 8 each. MIn A   Walking with Sports Cord 30# 3 x fwd 2 sets                       PT Short Term Goals - 01/27/21 1613       PT SHORT TERM GOAL #1   Title give HEP that he will do at home    Status Achieved               PT Long Term Goals - 03/22/21 1547       PT LONG TERM GOAL #1   Title independent with an advanced HEP    Status Achieved      PT LONG TERM GOAL #2   Title decrease TUG time to 18 seconds    Status Partially Met  PT LONG TERM GOAL #3   Title walk with a cane or lessed AD x 500 feet    Status On-going      PT LONG TERM GOAL #4   Title get up from sitting without using hands    Status Achieved                   Plan - 03/24/21 1605     Clinical Impression Statement progressed wt bearing actvities with assistance physical and tactile needed. TM working on increasing upright posture and smooth step through. resisted gait, fucn step up and overs- assisatcne needed t flex knee as he wants to keep stiff and shift wt through hips    PT Treatment/Interventions ADLs/Self Care Home Management;Gait training;Neuromuscular re-education;Balance training;Therapeutic exercise;Therapeutic activities;Functional mobility training;Stair training;Patient/family education;Manual techniques    PT Next Visit Plan progress exercises for functional strength and balance, step ups             Patient will benefit from skilled therapeutic intervention in order to improve the following deficits and impairments:  Abnormal gait, Decreased coordination, Decreased range of motion, Difficulty walking, Decreased endurance, Cardiopulmonary status limiting activity, Decreased activity tolerance, Decreased balance, Improper body mechanics, Postural dysfunction, Decreased strength, Decreased mobility  Visit Diagnosis: Difficulty in walking, not elsewhere  classified  Muscle weakness (generalized)     Problem List Patient Active Problem List   Diagnosis Date Noted   Prostate cancer ([place]) 09/26/2012   DYSLIPIDEMIA 05/02/2007   MORBID OBESITY 05/02/2007   OBSTRUCTIVE SLEEP APNEA 05/02/2007   HYPERTENSION 05/02/2007    Kimiye Strathman,[name], PTA 03/24/2021, 4:07 PM  [place]. [place], [place], [address] Phone: [phone]   Fax:  [phone]  Name: Daniel [name] MRN: [id number] Date of Birth: Nov 01, 1955

## 2021-03-29 ENCOUNTER — Ambulatory Visit: Payer: Medicare PPO | Admitting: Physical Therapy

## 2021-03-29 ENCOUNTER — Other Ambulatory Visit: Payer: Self-pay

## 2021-03-29 DIAGNOSIS — R262 Difficulty in walking, not elsewhere classified: Secondary | ICD-10-CM

## 2021-03-29 DIAGNOSIS — M6281 Muscle weakness (generalized): Secondary | ICD-10-CM

## 2021-03-29 NOTE — Therapy (Signed)
[place]. [place], [place], [address] Phone: [phone]   Fax:  [phone]  Physical Therapy Treatment  Patient Details  Name: Daniel [name] MRN: [id number] Date of Birth: 04/09/1956 Referring Provider (PT): [name]   Encounter Date: 03/29/2021   PT End of Session - 03/29/21 1609     Visit Number 17    Date for PT Re-Evaluation 04/23/21    Authorization Type [employer]    PT Start Time 7322    PT Stop Time 1615    PT Time Calculation (min) 60 min             Past Medical History:  Diagnosis Date   Arthritis    Diabetes mellitus without complication ([place])    Elevated PSA 08/29/2012   7.22   Hyperlipidemia    Hypertension    Obstructive sleep apnea    CPAP   Prostate cancer [facility]) Feb 2014   Spinal stenosis     Past Surgical History:  Procedure Laterality Date   KNEE SURGERY     torn meniscus   PILONIDAL CYST EXCISION     lower spine age 43   PROSTATE BIOPSY Bilateral 08/29/2012   gleason 3+3=6  Dr.Marc Nesi   RADIOACTIVE SEED IMPLANT N/A 11/19/2012   Procedure: RADIOACTIVE SEED IMPLANT;  Surgeon: Hanley Ben, MD;  [facility];  Service: Urology;  Laterality: N/A;   SPINAL CORD DECOMPRESSION     Spinal injection  2012   spinal stenosis   TONSILLECTOMY     as child age 69 or 5    There were no vitals filed for this visit.   Subjective Assessment - 03/29/21 1529     Subjective feeling pretty good    Currently in Pain? Yes    Pain Score 3     Pain Location Back                               [facility] Adult PT Treatment/Exercise - 03/29/21 0001       Ambulation/Gait   Gait Comments progressing gait with more fluid steps, step over step and cane with step      Lumbar Exercises: Aerobic   Tread Mill 3 min .5 mph    UBE (Upper Arm Bike) L 5 3 min fwd/3 min back      Knee/Hip Exercises: Aerobic   Nustep L 6 10 min      Knee/Hip Exercises: Machines for  Strengthening   Cybex Leg Press 100# 2 sets 15      Knee/Hip Exercises: Standing   Forward Step Up Both;2 sets;5 sets;Hand Hold: 2;Step Height: 6"   min A with walker     Knee/Hip Exercises: Seated   Sit to Sand 2 sets;5 reps;without UE support   wt ball toss                      PT Short Term Goals - 01/27/21 1613       PT SHORT TERM GOAL #1   Title give HEP that he will do at home    Status Achieved               PT Long Term Goals - 03/22/21 1547       PT LONG TERM GOAL #1   Title independent with an advanced HEP    Status Achieved      PT LONG TERM  GOAL #2   Title decrease TUG time to 18 seconds    Status Partially Met      PT LONG TERM GOAL #3   Title walk with a cane or lessed AD x 500 feet    Status On-going      PT LONG TERM GOAL #4   Title get up from sitting without using hands    Status Achieved                   Plan - 03/29/21 1609     Clinical Impression Statement increased ease on TM and translating into better steps with [facility] with with more fluid steps and moving leg with cane showing advance gait pattern. increased wt on leg press. increased ability to step up with heavy UE use and PTA supporting knee- previously pt unable to step up    PT Treatment/Interventions ADLs/Self Care Home Management;Gait training;Neuromuscular re-education;Balance training;Therapeutic exercise;Therapeutic activities;Functional mobility training;Stair training;Patient/family education;Manual techniques    PT Next Visit Plan progress exercises for functional strength and balance, step ups             Patient will benefit from skilled therapeutic intervention in order to improve the following deficits and impairments:  Abnormal gait, Decreased coordination, Decreased range of motion, Difficulty walking, Decreased endurance, Cardiopulmonary status limiting activity, Decreased activity tolerance, Decreased balance, Improper body mechanics, Postural  dysfunction, Decreased strength, Decreased mobility  Visit Diagnosis: Difficulty in walking, not elsewhere classified  Muscle weakness (generalized)     Problem List Patient Active Problem List   Diagnosis Date Noted   Prostate cancer ([place]) 09/26/2012   DYSLIPIDEMIA 05/02/2007   MORBID OBESITY 05/02/2007   OBSTRUCTIVE SLEEP APNEA 05/02/2007   HYPERTENSION 05/02/2007    Royale Lennartz,[name], PTA 03/29/2021, 4:14 PM  [place]. [place], [place], [address] Phone: [phone]   Fax:  [phone]  Name: Daniel [name] MRN: [id number] Date of Birth: April 05, 1956

## 2021-03-31 ENCOUNTER — Other Ambulatory Visit: Payer: Self-pay

## 2021-03-31 ENCOUNTER — Ambulatory Visit: Payer: Medicare PPO | Admitting: Physical Therapy

## 2021-03-31 DIAGNOSIS — R262 Difficulty in walking, not elsewhere classified: Secondary | ICD-10-CM | POA: Diagnosis not present

## 2021-03-31 DIAGNOSIS — M6281 Muscle weakness (generalized): Secondary | ICD-10-CM | POA: Diagnosis not present

## 2021-03-31 NOTE — Therapy (Signed)
[place]. [place], [place], [address] Phone: [phone]   Fax:  [phone]  Physical Therapy Treatment  Patient Details  Name: Daniel [name] MRN: [id number] Date of Birth: 1956/04/12 Referring Provider (PT): Repass   Encounter Date: 03/31/2021   PT End of Session - 03/31/21 1609     Visit Number 18    Date for PT Re-Evaluation 04/23/21    Authorization Type [employer]    PT Start Time 1510    PT Stop Time 3536    PT Time Calculation (min) 60 min             Past Medical History:  Diagnosis Date   Arthritis    Diabetes mellitus without complication ([place])    Elevated PSA 08/29/2012   7.22   Hyperlipidemia    Hypertension    Obstructive sleep apnea    CPAP   Prostate cancer [facility]) Feb 2014   Spinal stenosis     Past Surgical History:  Procedure Laterality Date   KNEE SURGERY     torn meniscus   PILONIDAL CYST EXCISION     lower spine age 66   PROSTATE BIOPSY Bilateral 08/29/2012   gleason 3+3=6  Dr.Marc Nesi   RADIOACTIVE SEED IMPLANT N/A 11/19/2012   Procedure: RADIOACTIVE SEED IMPLANT;  Surgeon: Hanley Ben, MD;  [facility];  Service: Urology;  Laterality: N/A;   SPINAL CORD DECOMPRESSION     Spinal injection  2012   spinal stenosis   TONSILLECTOMY     as child age 6 or 5    There were no vitals filed for this visit.   Subjective Assessment - 03/31/21 1511     Subjective doing pretty well    Currently in Pain? No/denies                               [facility] Adult PT Treatment/Exercise - 03/31/21 0001       Lumbar Exercises: Aerobic   Tread Mill attempted but to difficult after Nustep and UB only lasted 10 -15 sec    UBE (Upper Arm Bike) L 5 3 min fwd/3 min back      Lumbar Exercises: Supine   Ab Set 15 reps;3 seconds    Pelvic Tilt 15 reps   issued as HEP   Bridge Compliant;15 reps;3 seconds    Other Supine Lumbar Exercises feet on ball bridge and obl  15 x      Knee/Hip Exercises: Aerobic   Nustep L 6 10 min      Knee/Hip Exercises: Machines for Strengthening   Cybex Knee Extension 20lb 2x15    Cybex Knee Flexion 45# 2x15      Knee/Hip Exercises: Standing   Walking with Sports Cord 30# HHA 5 x fwd and back      Manual Therapy   Manual Therapy Passive ROM    Manual therapy comments educ in stretching at home    Passive ROM LE, HS,calf, add and hip flexor                       PT Short Term Goals - 01/27/21 1613       PT SHORT TERM GOAL #1   Title give HEP that he will do at home    Status Achieved               PT Long Term  Goals - 03/31/21 1607       PT LONG TERM GOAL #1   Title independent with an advanced HEP    Baseline evolving as we progress    Status Partially Met      PT LONG TERM GOAL #2   Title decrease TUG time to 18 seconds    Baseline RW 22 sec    Status Partially Met      PT LONG TERM GOAL #3   Title walk with a cane or lessed AD x 500 feet    Baseline adding in TM to work on step thru gait and speed    Status On-going      PT LONG TERM GOAL #4   Title get up from sitting without using hands    Baseline varies on height and surface    Status Partially Met                   Plan - 03/31/21 1610     Clinical Impression Statement pt struggled with TM after doing nustep and UBE first. RT LE weakness and cramping. PROM to LE and educ on stretching at home as well as PPT for stab to see if RT hip groing pian is coming form back.    PT Treatment/Interventions ADLs/Self Care Home Management;Gait training;Neuromuscular re-education;Balance training;Therapeutic exercise;Therapeutic activities;Functional mobility training;Stair training;Patient/family education;Manual techniques    PT Next Visit Plan progress exercises for functional strength and balance, step ups             Patient will benefit from skilled therapeutic intervention in order to improve the following  deficits and impairments:  Abnormal gait, Decreased coordination, Decreased range of motion, Difficulty walking, Decreased endurance, Cardiopulmonary status limiting activity, Decreased activity tolerance, Decreased balance, Improper body mechanics, Postural dysfunction, Decreased strength, Decreased mobility  Visit Diagnosis: Difficulty in walking, not elsewhere classified  Muscle weakness (generalized)     Problem List Patient Active Problem List   Diagnosis Date Noted   Prostate cancer ([place]) 09/26/2012   DYSLIPIDEMIA 05/02/2007   MORBID OBESITY 05/02/2007   OBSTRUCTIVE SLEEP APNEA 05/02/2007   HYPERTENSION 05/02/2007    Ashyia Schraeder,[name], PTA 03/31/2021, 4:11 PM  [place]. [place], [place], [address] Phone: [phone]   Fax:  [phone]  Name: Daniel [name] MRN: [id number] Date of Birth: [birth year]

## 2021-04-04 ENCOUNTER — Other Ambulatory Visit: Payer: Self-pay

## 2021-04-04 ENCOUNTER — Encounter: Payer: Self-pay | Admitting: Physical Therapy

## 2021-04-04 ENCOUNTER — Ambulatory Visit: Payer: Medicare PPO | Admitting: Physical Therapy

## 2021-04-04 DIAGNOSIS — R262 Difficulty in walking, not elsewhere classified: Secondary | ICD-10-CM

## 2021-04-04 DIAGNOSIS — M6281 Muscle weakness (generalized): Secondary | ICD-10-CM | POA: Diagnosis not present

## 2021-04-04 NOTE — Therapy (Signed)
[place]. [place], [place], [address] Phone: [phone]   Fax:  [phone]  Physical Therapy Treatment  Patient Details  Name: Daniel [name] MRN: [id number] Date of Birth: 12-27-1955 Referring Provider (PT): Repass   Encounter Date: 04/04/2021   PT End of Session - 04/04/21 1508     Visit Number 19    Date for PT Re-Evaluation 04/23/21    Authorization Type [employer]    PT Start Time 1416    PT Stop Time 1510    PT Time Calculation (min) 54 min    Activity Tolerance Patient tolerated treatment well    Behavior During Therapy [facility] for tasks assessed/performed             Past Medical History:  Diagnosis Date   Arthritis    Diabetes mellitus without complication ([place])    Elevated PSA 08/29/2012   7.22   Hyperlipidemia    Hypertension    Obstructive sleep apnea    CPAP   Prostate cancer [facility]) Feb 2014   Spinal stenosis     Past Surgical History:  Procedure Laterality Date   KNEE SURGERY     torn meniscus   PILONIDAL CYST EXCISION     lower spine age 17   PROSTATE BIOPSY Bilateral 08/29/2012   gleason 3+3=6  Dr.Marc Nesi   RADIOACTIVE SEED IMPLANT N/A 11/19/2012   Procedure: RADIOACTIVE SEED IMPLANT;  Surgeon: Hanley Ben, MD;  [facility];  Service: Urology;  Laterality: N/A;   SPINAL CORD DECOMPRESSION     Spinal injection  2012   spinal stenosis   TONSILLECTOMY     as child age 1 or 5    There were no vitals filed for this visit.   Subjective Assessment - 04/04/21 1429     Subjective "Pretty good"    Currently in Pain? No/denies                               [facility] Adult PT Treatment/Exercise - 04/04/21 0001       Ambulation/Gait   Ambulation/Gait Yes    Ambulation Distance (Feet) 600 Feet    Assistive device Rolling walker    Gait Pattern Step-through pattern    Ambulation Surface Level;Indoor    Gait Comments One seated rest      Lumbar  Exercises: Aerobic   UBE (Upper Arm Bike) L 5 3 min fwd/3 min back      Lumbar Exercises: Seated   Sit to Stand 10 reps   directly after each  gait trial   Other Seated Lumbar Exercises Overhead weighted ball toss 2x15 for core      Knee/Hip Exercises: Aerobic   Nustep L 6 10 min      Knee/Hip Exercises: Standing   Forward Step Up Both;2 sets;5 sets;Hand Hold: 2;Step Height: 6"   min in RW                      PT Short Term Goals - 01/27/21 1613       PT SHORT TERM GOAL #1   Title give HEP that he will do at home    Status Achieved               PT Long Term Goals - 03/31/21 1607       PT LONG TERM GOAL #1   Title independent with an advanced HEP  Baseline evolving as we progress    Status Partially Met      PT LONG TERM GOAL #2   Title decrease TUG time to 18 seconds    Baseline RW 22 sec    Status Partially Met      PT LONG TERM GOAL #3   Title walk with a cane or lessed AD x 500 feet    Baseline adding in TM to work on step thru gait and speed    Status On-going      PT LONG TERM GOAL #4   Title get up from sitting without using hands    Baseline varies on height and surface    Status Partially Met                   Plan - 04/04/21 1509     Clinical Impression Statement Really pushed function endurance during session. Pt able to progress with a single gait trial distance of 400 feet before needing to rest. Each gait trial followed by sit to stands with little rest in between. Heavy UE use with step ups, more so when stepping up with LLE. Cues to engage core with overhead ball toss.    Rehab Potential Good    PT Frequency 2x / week    PT Duration 12 weeks    PT Treatment/Interventions ADLs/Self Care Home Management;Gait training;Neuromuscular re-education;Balance training;Therapeutic exercise;Therapeutic activities;Functional mobility training;Stair training;Patient/family education;Manual techniques    PT Next Visit Plan progress  exercises for functional strength and balance, step ups             Patient will benefit from skilled therapeutic intervention in order to improve the following deficits and impairments:  Abnormal gait, Decreased coordination, Decreased range of motion, Difficulty walking, Decreased endurance, Cardiopulmonary status limiting activity, Decreased activity tolerance, Decreased balance, Improper body mechanics, Postural dysfunction, Decreased strength, Decreased mobility  Visit Diagnosis: Difficulty in walking, not elsewhere classified  Muscle weakness (generalized)     Problem List Patient Active Problem List   Diagnosis Date Noted   Prostate cancer ([place]) 09/26/2012   DYSLIPIDEMIA 05/02/2007   MORBID OBESITY 05/02/2007   OBSTRUCTIVE SLEEP APNEA 05/02/2007   HYPERTENSION 05/02/2007    [name], PTA 04/04/2021, 3:12 PM  [place]. [place], [place], [address] Phone: [phone]   Fax:  [phone]  Name: Daniel [name] MRN: [id number] Date of Birth: Apr 20, 1956

## 2021-04-07 ENCOUNTER — Encounter: Payer: Self-pay | Admitting: Physical Therapy

## 2021-04-07 ENCOUNTER — Ambulatory Visit: Payer: Medicare PPO | Admitting: Physical Therapy

## 2021-04-07 ENCOUNTER — Other Ambulatory Visit: Payer: Self-pay

## 2021-04-07 DIAGNOSIS — R262 Difficulty in walking, not elsewhere classified: Secondary | ICD-10-CM | POA: Diagnosis not present

## 2021-04-07 DIAGNOSIS — M6281 Muscle weakness (generalized): Secondary | ICD-10-CM | POA: Diagnosis not present

## 2021-04-07 NOTE — Therapy (Signed)
[place]. [place], [place], [address] Phone: [phone]   Fax:  [phone] Progress Note Reporting Period 03/08/21 to 04/07/21  See note below for Objective Data and Assessment of Progress/Goals.     Physical Therapy Treatment  Patient Details  Name: Daniel [name] MRN: [id number] Date of Birth: 1956-03-23 Referring Provider (PT): Repass   Encounter Date: 04/07/2021   PT End of Session - 04/07/21 1139     Visit Number 20    Date for PT Re-Evaluation 04/23/21    PT Start Time 1049    PT Stop Time 1140    PT Time Calculation (min) 51 min    Activity Tolerance Patient tolerated treatment well    Behavior During Therapy [facility] for tasks assessed/performed             Past Medical History:  Diagnosis Date   Arthritis    Diabetes mellitus without complication ([place])    Elevated PSA 08/29/2012   7.22   Hyperlipidemia    Hypertension    Obstructive sleep apnea    CPAP   Prostate cancer [facility]) Feb 2014   Spinal stenosis     Past Surgical History:  Procedure Laterality Date   KNEE SURGERY     torn meniscus   PILONIDAL CYST EXCISION     lower spine age 48   PROSTATE BIOPSY Bilateral 08/29/2012   gleason 3+3=6  Dr.Marc Nesi   RADIOACTIVE SEED IMPLANT N/A 11/19/2012   Procedure: RADIOACTIVE SEED IMPLANT;  Surgeon: Hanley Ben, MD;  Location: [facility];  Service: Urology;  Laterality: N/A;   SPINAL CORD DECOMPRESSION     Spinal injection  2012   spinal stenosis   TONSILLECTOMY     as child age 61 or 5    There were no vitals filed for this visit.   Subjective Assessment - 04/07/21 1045     Subjective Pretty good, just a little tender    Currently in Pain? No/denies                               [facility] Adult PT Treatment/Exercise - 04/07/21 0001       Ambulation/Gait   Ambulation/Gait Yes    Ambulation Distance (Feet) 500 Feet    Assistive device Rolling walker    Gait  Pattern Step-through pattern    Ambulation Surface Level;Indoor    Gait Comments befir warm up      Lumbar Exercises: Aerobic   UBE (Upper Arm Bike) L 5 3 min fwd/3 min back      Lumbar Exercises: Standing   Row Strengthening;Both;Theraband;15 reps   x2   Theraband Level (Row) Level 4 (Blue)    Shoulder Extension Strengthening;Both;20 reps;Theraband    Theraband Level (Shoulder Extension) Level 4 (Blue)      Lumbar Exercises: Seated   Sit to Stand 5 reps   w/ yellow ball, 2 sets OHP with ball   Other Seated Lumbar Exercises Overhead weighted ball toss 2x15 for core      Knee/Hip Exercises: Standing   Other Standing Knee Exercises Alt 8in box taps RW 2x10 each                       PT Short Term Goals - 01/27/21 1613       PT SHORT TERM GOAL #1   Title give HEP that he will do at home  Status Achieved               PT Long Term Goals - 04/07/21 1139       PT LONG TERM GOAL #1   Title independent with an advanced HEP    Status Partially Met      PT LONG TERM GOAL #2   Title decrease TUG time to 18 seconds    Status Partially Met      PT LONG TERM GOAL #3   Title walk with a cane or lessed AD x 500 feet    Status Partially Met      PT LONG TERM GOAL #4   Title get up from sitting without using hands    Status Achieved                   Plan - 04/07/21 1140     Clinical Impression Statement Pt has progressed towards goals increasing gait trial distance with RW. Gait was doing at beginning session without aerobic warm up. Postural cues needed with alt box taps. Pt had one bout of L knee pain getting up after UBE. Cue for core engage net with overhead ball throws.    Rehab Potential Good    PT Frequency 2x / week    PT Duration 12 weeks    PT Treatment/Interventions ADLs/Self Care Home Management;Gait training;Neuromuscular re-education;Balance training;Therapeutic exercise;Therapeutic activities;Functional mobility training;Stair  training;Patient/family education;Manual techniques    PT Next Visit Plan progress exercises for functional strength and balance, step ups             Patient will benefit from skilled therapeutic intervention in order to improve the following deficits and impairments:  Abnormal gait, Decreased coordination, Decreased range of motion, Difficulty walking, Decreased endurance, Cardiopulmonary status limiting activity, Decreased activity tolerance, Decreased balance, Improper body mechanics, Postural dysfunction, Decreased strength, Decreased mobility  Visit Diagnosis: Muscle weakness (generalized)  Difficulty in walking, not elsewhere classified     Problem List Patient Active Problem List   Diagnosis Date Noted   Prostate cancer ([place]) 09/26/2012   DYSLIPIDEMIA 05/02/2007   MORBID OBESITY 05/02/2007   OBSTRUCTIVE SLEEP APNEA 05/02/2007   HYPERTENSION 05/02/2007    [name], PTA 04/07/2021, 11:43 AM  [place]. [place], [place], [address] Phone: [phone]   Fax:  [phone]  Name: Daniel [name] MRN: [id number] Date of Birth: 1956/02/16

## 2021-04-12 ENCOUNTER — Ambulatory Visit: Payer: Medicare PPO | Admitting: Physical Therapy

## 2021-04-12 ENCOUNTER — Other Ambulatory Visit: Payer: Self-pay

## 2021-04-12 DIAGNOSIS — R262 Difficulty in walking, not elsewhere classified: Secondary | ICD-10-CM | POA: Diagnosis not present

## 2021-04-12 DIAGNOSIS — M6281 Muscle weakness (generalized): Secondary | ICD-10-CM

## 2021-04-12 NOTE — Therapy (Signed)
[place]. [place], [place], [address] Phone: [phone]   Fax:  [phone]  Physical Therapy Treatment  Patient Details  Name: Daniel [name] MRN: [id number] Date of Birth: 1955/05/03 Referring Provider (PT): Repass   Encounter Date: 04/12/2021   PT End of Session - 04/12/21 1314     Visit Number 21    Date for PT Re-Evaluation 04/23/21    Authorization Type [employer]    PT Start Time 9244    PT Stop Time 1315    PT Time Calculation (min) 50 min             Past Medical History:  Diagnosis Date   Arthritis    Diabetes mellitus without complication ([place])    Elevated PSA 08/29/2012   7.22   Hyperlipidemia    Hypertension    Obstructive sleep apnea    CPAP   Prostate cancer [facility]) Feb 2014   Spinal stenosis     Past Surgical History:  Procedure Laterality Date   KNEE SURGERY     torn meniscus   PILONIDAL CYST EXCISION     lower spine age 52   PROSTATE BIOPSY Bilateral 08/29/2012   gleason 3+3=6  Dr.Marc Nesi   RADIOACTIVE SEED IMPLANT N/A 11/19/2012   Procedure: RADIOACTIVE SEED IMPLANT;  Surgeon: Hanley Ben, MD;  [facility];  Service: Urology;  Laterality: N/A;   SPINAL CORD DECOMPRESSION     Spinal injection  2012   spinal stenosis   TONSILLECTOMY     as child age 37 or 5    There were no vitals filed for this visit.   Subjective Assessment - 04/12/21 1225     Subjective posterior lateral left knee popping since last session- getting better but want to back off alittle today    Currently in Pain? Yes    Pain Score 5     Pain Location Leg    Pain Orientation Left                               [facility] Adult PT Treatment/Exercise - 04/12/21 0001       Lumbar Exercises: Aerobic   UBE (Upper Arm Bike) L 5 3 min fwd/3 min back    Nustep L 6 6 min      Knee/Hip Exercises: Machines for Strengthening   Cybex Knee Extension 20lb 2x15    Cybex Knee Flexion 45#  2x15      Knee/Hip Exercises: Standing   Terminal Knee Extension Strengthening;1 set;Both;15 reps;Theraband   with mini squat   Theraband Level (Terminal Knee Extension) Level 4 (Blue)    Other Standing Knee Exercises blue tband shld ext and row 15 x    Other Standing Knee Exercises blue tband hip 3 way 10 x each and then 10 x alt      Knee/Hip Exercises: Seated   Other Seated Knee/Hip Exercises lat pull 15 x blue tband      Manual Therapy   Manual Therapy Joint mobilization    Joint Mobilization left knee                       PT Short Term Goals - 01/27/21 1613       PT SHORT TERM GOAL #1   Title give HEP that he will do at home    Status Achieved  PT Long Term Goals - 04/07/21 1139       PT LONG TERM GOAL #1   Title independent with an advanced HEP    Status Partially Met      PT LONG TERM GOAL #2   Title decrease TUG time to 18 seconds    Status Partially Met      PT LONG TERM GOAL #3   Title walk with a cane or lessed AD x 500 feet    Status Partially Met      PT LONG TERM GOAL #4   Title get up from sitting without using hands    Status Achieved                   Plan - 04/12/21 1314     Clinical Impression Statement pt requested lighter session today d/t popping in left knee since last session- pt c/o pain post/lat but with palpation feels like it is joint crepitus. pt had some pian during session with 2 instatnts of it buckling with SLS.    PT Treatment/Interventions ADLs/Self Care Home Management;Gait training;Neuromuscular re-education;Balance training;Therapeutic exercise;Therapeutic activities;Functional mobility training;Stair training;Patient/family education;Manual techniques    PT Next Visit Plan progress exercises for functional strength and balance, step ups- check goals             Patient will benefit from skilled therapeutic intervention in order to improve the following deficits and impairments:   Abnormal gait, Decreased coordination, Decreased range of motion, Difficulty walking, Decreased endurance, Cardiopulmonary status limiting activity, Decreased activity tolerance, Decreased balance, Improper body mechanics, Postural dysfunction, Decreased strength, Decreased mobility  Visit Diagnosis: Muscle weakness (generalized)  Difficulty in walking, not elsewhere classified     Problem List Patient Active Problem List   Diagnosis Date Noted   Prostate cancer ([place]) 09/26/2012   DYSLIPIDEMIA 05/02/2007   MORBID OBESITY 05/02/2007   OBSTRUCTIVE SLEEP APNEA 05/02/2007   HYPERTENSION 05/02/2007    Katye Valek,[name], PTA 04/12/2021, 1:16 PM  [place]. [place], [place], [address] Phone: [phone]   Fax:  [phone]  Name: Daniel [name] MRN: [id number] Date of Birth: [birth year]

## 2021-04-14 ENCOUNTER — Other Ambulatory Visit: Payer: Self-pay

## 2021-04-14 ENCOUNTER — Ambulatory Visit: Payer: Medicare PPO | Admitting: Physical Therapy

## 2021-04-14 ENCOUNTER — Encounter: Payer: Self-pay | Admitting: Physical Therapy

## 2021-04-14 DIAGNOSIS — M6281 Muscle weakness (generalized): Secondary | ICD-10-CM | POA: Diagnosis not present

## 2021-04-14 DIAGNOSIS — R262 Difficulty in walking, not elsewhere classified: Secondary | ICD-10-CM

## 2021-04-14 NOTE — Therapy (Signed)
[place]. [place], [place], [address] Phone: [phone]   Fax:  [phone]  Physical Therapy Treatment  Patient Details  Name: Daniel [name] MRN: [id number] Date of Birth: 08/30/1955 Referring Provider (PT): Repass   Encounter Date: 04/14/2021   PT End of Session - 04/14/21 1604     Visit Number 22    Authorization Type [employer]    PT Start Time 2536    PT Stop Time 1602    PT Time Calculation (min) 52 min    Activity Tolerance Patient tolerated treatment well    Behavior During Therapy [facility] for tasks assessed/performed             Past Medical History:  Diagnosis Date   Arthritis    Diabetes mellitus without complication ([place])    Elevated PSA 08/29/2012   7.22   Hyperlipidemia    Hypertension    Obstructive sleep apnea    CPAP   Prostate cancer [facility]) Feb 2014   Spinal stenosis     Past Surgical History:  Procedure Laterality Date   KNEE SURGERY     torn meniscus   PILONIDAL CYST EXCISION     lower spine age 51   PROSTATE BIOPSY Bilateral 08/29/2012   gleason 3+3=6  Dr.Marc Nesi   RADIOACTIVE SEED IMPLANT N/A 11/19/2012   Procedure: RADIOACTIVE SEED IMPLANT;  Surgeon: Hanley Ben, MD;  [facility];  Service: Urology;  Laterality: N/A;   SPINAL CORD DECOMPRESSION     Spinal injection  2012   spinal stenosis   TONSILLECTOMY     as child age 56 or 5    There were no vitals filed for this visit.   Subjective Assessment - 04/14/21 1515     Subjective "Feeling pretty good"    Currently in Pain? No/denies                               [facility] Adult PT Treatment/Exercise - 04/14/21 0001       High Level Balance   High Level Balance Comments On aires reaching outside base of support      Lumbar Exercises: Aerobic   UBE (Upper Arm Bike) L 5 3 min fwd/3 min back    Nustep L 6 8 min      Lumbar Exercises: Standing   Other Standing Lumbar Exercises Alt 8 in box  taps in RW x10    Other Standing Lumbar Exercises 4 in box taps LBQC x3 each   min guard     Lumbar Exercises: Seated   Sit to Stand 5 reps   x3 OHP w/ yellow ball     Knee/Hip Exercises: Machines for Strengthening   Cybex Knee Extension 20lb 2x15    Cybex Knee Flexion 45# 2x15                       PT Short Term Goals - 01/27/21 1613       PT SHORT TERM GOAL #1   Title give HEP that he will do at home    Status Achieved               PT Long Term Goals - 04/14/21 1605       PT LONG TERM GOAL #1   Title independent with an advanced HEP    Status Partially Met  Plan - 04/14/21 1605     Clinical Impression Statement Pt enters clinic reporting improvement with L knee pain. Some instability when standing, pt tends to put most of his weight on his heels. Difficulty maintaining balance on airex pad reaching outside base of support. Heavy UE use with box taps in RW. Pt hesitant to perform box taps with large base quad cane out of fear of knees buckling. Another therapist assist in treatment to prevent buckling of LE with 4 in box taps.    Rehab Potential Good    PT Frequency 2x / week    PT Duration 12 weeks    PT Treatment/Interventions ADLs/Self Care Home Management;Gait training;Neuromuscular re-education;Balance training;Therapeutic exercise;Therapeutic activities;Functional mobility training;Stair training;Patient/family education;Manual techniques    PT Next Visit Plan progress exercises for functional strength and balance, step ups- check goals             Patient will benefit from skilled therapeutic intervention in order to improve the following deficits and impairments:  Abnormal gait, Decreased coordination, Decreased range of motion, Difficulty walking, Decreased endurance, Cardiopulmonary status limiting activity, Decreased activity tolerance, Decreased balance, Improper body mechanics, Postural dysfunction, Decreased  strength, Decreased mobility  Visit Diagnosis: Muscle weakness (generalized)  Difficulty in walking, not elsewhere classified     Problem List Patient Active Problem List   Diagnosis Date Noted   Prostate cancer ([place]) 09/26/2012   DYSLIPIDEMIA 05/02/2007   MORBID OBESITY 05/02/2007   OBSTRUCTIVE SLEEP APNEA 05/02/2007   HYPERTENSION 05/02/2007    [name], PTA 04/14/2021, 4:12 PM  [place]. [place], [place], [address] Phone: [phone]   Fax:  [phone]  Name: Gaines Cartmell MRN: [id number] Date of Birth: December 24, 1955

## 2021-04-19 ENCOUNTER — Ambulatory Visit: Payer: Medicare PPO | Admitting: Physical Therapy

## 2021-04-19 ENCOUNTER — Other Ambulatory Visit: Payer: Self-pay

## 2021-04-19 DIAGNOSIS — R262 Difficulty in walking, not elsewhere classified: Secondary | ICD-10-CM

## 2021-04-19 DIAGNOSIS — M6281 Muscle weakness (generalized): Secondary | ICD-10-CM

## 2021-04-19 NOTE — Therapy (Signed)
[place]. [place], [place], [address] Phone: [phone]   Fax:  [phone]  Physical Therapy Treatment  Patient Details  Name: Daniel [name] MRN: [id number] Date of Birth: [birth year] Referring Provider (PT): Repass   Encounter Date: 04/19/2021   PT End of Session - 04/19/21 1355     Visit Number 23    Date for PT Re-Evaluation 04/23/21    Authorization Type [employer]    PT Start Time 1215    PT Stop Time 1310    PT Time Calculation (min) 55 min             Past Medical History:  Diagnosis Date   Arthritis    Diabetes mellitus without complication ([place])    Elevated PSA 08/29/2012   7.22   Hyperlipidemia    Hypertension    Obstructive sleep apnea    CPAP   Prostate cancer [facility]) Feb 2014   Spinal stenosis     Past Surgical History:  Procedure Laterality Date   KNEE SURGERY     torn meniscus   PILONIDAL CYST EXCISION     lower spine age 48   PROSTATE BIOPSY Bilateral 08/29/2012   gleason 3+3=6  Dr.Marc Nesi   RADIOACTIVE SEED IMPLANT N/A 11/19/2012   Procedure: RADIOACTIVE SEED IMPLANT;  Surgeon: Hanley Ben, MD;  [facility];  Service: Urology;  Laterality: N/A;   SPINAL CORD DECOMPRESSION     Spinal injection  2012   spinal stenosis   TONSILLECTOMY     as child age 52 or 5    There were no vitals filed for this visit.   Subjective Assessment - 04/19/21 1219     Subjective knees are still tender- ordered knee braces compression    Currently in Pain? Yes                               [facility] Adult PT Treatment/Exercise - 04/19/21 0001       Ambulation/Gait   Gait Comments amb with LBQC and HHA 35 feet 2 x with seated rest required      Lumbar Exercises: Aerobic   UBE (Upper Arm Bike) L 5 3 min fwd/3 min back    Nustep L 6 8 min      Lumbar Exercises: Standing   Other Standing Lumbar Exercises STS on airex CGA without UE 2 sets 5    Other Standing Lumbar  Exercises 4 in box taps LBQC 2 sets 5 with min A   5x laterally     Lumbar Exercises: Seated   Long Arc Quad on Chair Both;15 reps   blue tband   Other Seated Lumbar Exercises HS curl 15 x blue tband                       PT Short Term Goals - 01/27/21 1613       PT SHORT TERM GOAL #1   Title give HEP that he will do at home    Status Achieved               PT Long Term Goals - 04/19/21 1354       PT LONG TERM GOAL #1   Title independent with an advanced HEP    Baseline evolving as we progress    Status Partially Met      PT LONG TERM GOAL #2  Title decrease TUG time to 18 seconds    Baseline RW 19 sec    Status Partially Met      PT LONG TERM GOAL #3   Title walk with a cane or lessed AD x 500 feet    Baseline 35 feet 2 x with LBQC with HHA    Status Partially Met      PT LONG TERM GOAL #4   Title get up from sitting without using hands    Baseline varies on height and surface    Status Partially Met                   Plan - 04/19/21 1355     Clinical Impression Statement pt is making slow steady progress with goals. pt did have slight set back about 2 weeks ago with posterior left knee pin and popping- feels its slowly getting better but has ordered braces. I feel popping is in joint line and in joint. pt is fearful of falling and needs reassurance and cued with activties like step taps and with cuing did very well. pt is very motivated to get better and back to PLOF and feels PT is helping greatly.    PT Treatment/Interventions ADLs/Self Care Home Management;Gait training;Neuromuscular re-education;Balance training;Therapeutic exercise;Therapeutic activities;Functional mobility training;Stair training;Patient/family education;Manual techniques    PT Next Visit Plan progress exercises for functional strength and balance. submitted for insurance             Patient will benefit from skilled therapeutic intervention in order to improve  the following deficits and impairments:     Visit Diagnosis: Muscle weakness (generalized)  Difficulty in walking, not elsewhere classified     Problem List Patient Active Problem List   Diagnosis Date Noted   Prostate cancer ([place]) 09/26/2012   DYSLIPIDEMIA 05/02/2007   MORBID OBESITY 05/02/2007   OBSTRUCTIVE SLEEP APNEA 05/02/2007   HYPERTENSION 05/02/2007    Talma Aguillard,[name], PTA 04/19/2021, 2:09 PM  [place]. [place], [place], [address] Phone: [phone]   Fax:  [phone]  Name: Daniel [name] MRN: [id number] Date of Birth: [birth year]

## 2021-04-21 ENCOUNTER — Ambulatory Visit: Payer: Medicare PPO | Admitting: Physical Therapy

## 2021-04-26 ENCOUNTER — Encounter: Payer: Self-pay | Admitting: Physical Therapy

## 2021-04-26 ENCOUNTER — Ambulatory Visit: Payer: Medicare PPO | Attending: *Deleted | Admitting: Physical Therapy

## 2021-04-26 ENCOUNTER — Other Ambulatory Visit: Payer: Self-pay

## 2021-04-26 DIAGNOSIS — R262 Difficulty in walking, not elsewhere classified: Secondary | ICD-10-CM | POA: Diagnosis not present

## 2021-04-26 DIAGNOSIS — M6281 Muscle weakness (generalized): Secondary | ICD-10-CM | POA: Insufficient documentation

## 2021-04-26 NOTE — Therapy (Signed)
[place]. [place], [place], [address] Phone: [phone]   Fax:  [phone]  Physical Therapy Treatment  Patient Details  Name: Daniel [name] MRN: [id number] Date of Birth: 04-03-1956 Referring Provider (PT): Repass   Encounter Date: 04/26/2021   PT End of Session - 04/26/21 1552     Visit Number 24    Date for PT Re-Evaluation 04/23/21    Authorization Type [employer]    PT Start Time 1453    PT Stop Time 1553    PT Time Calculation (min) 60 min    Activity Tolerance Patient tolerated treatment well    Behavior During Therapy [facility] for tasks assessed/performed             Past Medical History:  Diagnosis Date   Arthritis    Diabetes mellitus without complication ([place])    Elevated PSA 08/29/2012   7.22   Hyperlipidemia    Hypertension    Obstructive sleep apnea    CPAP   Prostate cancer [facility]) Feb 2014   Spinal stenosis     Past Surgical History:  Procedure Laterality Date   KNEE SURGERY     torn meniscus   PILONIDAL CYST EXCISION     lower spine age 40   PROSTATE BIOPSY Bilateral 08/29/2012   gleason 3+3=6  Dr.Marc Nesi   RADIOACTIVE SEED IMPLANT N/A 11/19/2012   Procedure: RADIOACTIVE SEED IMPLANT;  Surgeon: Hanley Ben, MD;  [facility];  Service: Urology;  Laterality: N/A;   SPINAL CORD DECOMPRESSION     Spinal injection  2012   spinal stenosis   TONSILLECTOMY     as child age 49 or 5    There were no vitals filed for this visit.   Subjective Assessment - 04/26/21 1456     Subjective "Pretty good"    Currently in Pain? Yes    Pain Score 2     Pain Location Knee    Pain Orientation Left;Right                               [facility] Adult PT Treatment/Exercise - 04/26/21 0001       Lumbar Exercises: Aerobic   UBE (Upper Arm Bike) L 5 3 min fwd/3 min back    Nustep L 6 9 min      Lumbar Exercises: Standing   Row Strengthening;Both;Theraband;15 reps   x2    Theraband Level (Row) Level 4 (Blue)    Shoulder Extension Strengthening;Both;Theraband;15 reps   x2   Theraband Level (Shoulder Extension) Level 4 (Blue)    Other Standing Lumbar Exercises 4 in box taps LBQC 2 sets 5 with min A      Lumbar Exercises: Seated   Sit to Stand 5 reps   x2     Knee/Hip Exercises: Machines for Strengthening   Cybex Knee Extension 20lb 2x15    Cybex Knee Flexion 45# 2x15      Knee/Hip Exercises: Standing   Hip Abduction Stengthening;Both;1 set;10 reps;Knee straight   RW   Other Standing Knee Exercises Standing march RW x10      Knee/Hip Exercises: Seated   Other Seated Knee/Hip Exercises Fitter press 2 blue 1 black 2x15 each                       PT Short Term Goals - 01/27/21 1613       PT  SHORT TERM GOAL #1   Title give HEP that he will do at home    Status Achieved               PT Long Term Goals - 04/19/21 1354       PT LONG TERM GOAL #1   Title independent with an advanced HEP    Baseline evolving as we progress    Status Partially Met      PT LONG TERM GOAL #2   Title decrease TUG time to 18 seconds    Baseline RW 19 sec    Status Partially Met      PT LONG TERM GOAL #3   Title walk with a cane or lessed AD x 500 feet    Baseline 35 feet 2 x with LBQC with HHA    Status Partially Met      PT LONG TERM GOAL #4   Title get up from sitting without using hands    Baseline varies on height and surface    Status Partially Met                   Plan - 04/26/21 1555     Clinical Impression Statement Pt did fair overall today. Pt was very limited with functional activities but to L knee pain and weakness. Pt has difficult time performing sit to stands. Attempted box taps but pt unable to tolerate. Pt has good stability today with standing rows and extensions. Cue to increase hip and knee flexion with rows and extensions.    Rehab Potential Good    PT Frequency 2x / week    PT Duration 12 weeks    PT  Treatment/Interventions ADLs/Self Care Home Management;Gait training;Neuromuscular re-education;Balance training;Therapeutic exercise;Therapeutic activities;Functional mobility training;Stair training;Patient/family education;Manual techniques    PT Next Visit Plan progress exercises for functional strength and balance. submitted for insurance             Patient will benefit from skilled therapeutic intervention in order to improve the following deficits and impairments:  Abnormal gait, Decreased coordination, Decreased range of motion, Difficulty walking, Decreased endurance, Cardiopulmonary status limiting activity, Decreased activity tolerance, Decreased balance, Improper body mechanics, Postural dysfunction, Decreased strength, Decreased mobility  Visit Diagnosis: Muscle weakness (generalized)     Problem List Patient Active Problem List   Diagnosis Date Noted   Prostate cancer ([place]) 09/26/2012   DYSLIPIDEMIA 05/02/2007   MORBID OBESITY 05/02/2007   OBSTRUCTIVE SLEEP APNEA 05/02/2007   HYPERTENSION 05/02/2007    [name], PTA 04/26/2021, 3:59 PM  [place]. [place], [place], [address] Phone: [phone]   Fax:  [phone]  Name: Daniel [name] MRN: [id number] Date of Birth: 17-Dec-1955

## 2021-04-28 ENCOUNTER — Ambulatory Visit: Payer: Medicare PPO | Admitting: Physical Therapy

## 2021-04-28 ENCOUNTER — Other Ambulatory Visit: Payer: Self-pay

## 2021-04-28 ENCOUNTER — Encounter: Payer: Self-pay | Admitting: Physical Therapy

## 2021-04-28 DIAGNOSIS — R262 Difficulty in walking, not elsewhere classified: Secondary | ICD-10-CM | POA: Diagnosis not present

## 2021-04-28 DIAGNOSIS — M6281 Muscle weakness (generalized): Secondary | ICD-10-CM

## 2021-04-28 NOTE — Therapy (Signed)
[place]. [place], [place], [address] Phone: [phone]   Fax:  [phone]  Physical Therapy Treatment  Patient Details  Name: Daniel [name] MRN: [id number] Date of Birth: May 09, 1955 Referring Provider (PT): Repass   Encounter Date: 04/28/2021   PT End of Session - 04/28/21 1606     Visit Number 25    Date for PT Re-Evaluation 04/23/21    PT Start Time 7591    PT Stop Time 1605    PT Time Calculation (min) 50 min    Activity Tolerance Patient tolerated treatment well    Behavior During Therapy [facility] for tasks assessed/performed             Past Medical History:  Diagnosis Date   Arthritis    Diabetes mellitus without complication ([place])    Elevated PSA 08/29/2012   7.22   Hyperlipidemia    Hypertension    Obstructive sleep apnea    CPAP   Prostate cancer [facility]) Feb 2014   Spinal stenosis     Past Surgical History:  Procedure Laterality Date   KNEE SURGERY     torn meniscus   PILONIDAL CYST EXCISION     lower spine age 61   PROSTATE BIOPSY Bilateral 08/29/2012   gleason 3+3=6  Dr.Marc Nesi   RADIOACTIVE SEED IMPLANT N/A 11/19/2012   Procedure: RADIOACTIVE SEED IMPLANT;  Surgeon: Hanley Ben, MD;  Location: [facility];  Service: Urology;  Laterality: N/A;   SPINAL CORD DECOMPRESSION     Spinal injection  2012   spinal stenosis   TONSILLECTOMY     as child age 56 or 5    There were no vitals filed for this visit.   Subjective Assessment - 04/28/21 1515     Subjective "Doing pretty good" Still has some tenderness in L distal hamstring    Pertinent History DM, sleep apnea, HTN, prostate CA    Limitations Lifting;Standing;Walking;House hold activities    Currently in Pain? No/denies                               [facility] Adult PT Treatment/Exercise - 04/28/21 0001       High Level Balance   High Level Balance Activities Side stepping      Lumbar Exercises: Aerobic   UBE  (Upper Arm Bike) L 4 3 min fwd/3 min back    Nustep L 5 8 min      Lumbar Exercises: Standing   Shoulder Extension Strengthening;Both;Theraband;20 reps    Theraband Level (Shoulder Extension) Level 4 (Blue)      Knee/Hip Exercises: Machines for Strengthening   Cybex Knee Extension 20lb 2x15    Cybex Knee Flexion 45# 2x15      Knee/Hip Exercises: Standing   Forward Step Up Both;5 sets;Hand Hold: 2;Step Height: 6";1 set   3 reps LLE all in RW   Other Standing Knee Exercises Standing march RW x10      Knee/Hip Exercises: Seated   Sit to Sand 2 sets;20 reps;without UE support                       PT Short Term Goals - 04/28/21 1607       PT SHORT TERM GOAL #1   Title give HEP that he will do at home    Status Achieved  PT Long Term Goals - 04/28/21 1607       PT LONG TERM GOAL #1   Title independent with an advanced HEP    Status Partially Met      PT LONG TERM GOAL #2   Title decrease TUG time to 18 seconds    Status Partially Met      PT LONG TERM GOAL #3   Title walk with a cane or lessed AD x 500 feet    Status Partially Met      PT LONG TERM GOAL #4   Title get up from sitting without using hands    Status Achieved                   Plan - 04/28/21 1608     Clinical Impression Statement Pt continues to have complain of distal L hamstring tenderness. He reports concern about walking for distance in therapy. He did really well completing all interventions today. Pt was unable to complete all five reps of step ups with LLE due to weakness. Cue needed not to allow LE to lean against mat table with side steps. Pt would benefit  from continues  PT services to increase LE strength and his overall functional capacity.    Rehab Potential Good    PT Frequency 2x / week    PT Duration 12 weeks    PT Treatment/Interventions ADLs/Self Care Home Management;Gait training;Neuromuscular re-education;Balance training;Therapeutic  exercise;Therapeutic activities;Functional mobility training;Stair training;Patient/family education;Manual techniques             Patient will benefit from skilled therapeutic intervention in order to improve the following deficits and impairments:  Abnormal gait, Decreased coordination, Decreased range of motion, Difficulty walking, Decreased endurance, Cardiopulmonary status limiting activity, Decreased activity tolerance, Decreased balance, Improper body mechanics, Postural dysfunction, Decreased strength, Decreased mobility  Visit Diagnosis: Muscle weakness (generalized)  Difficulty in walking, not elsewhere classified     Problem List Patient Active Problem List   Diagnosis Date Noted   Prostate cancer ([place]) 09/26/2012   DYSLIPIDEMIA 05/02/2007   MORBID OBESITY 05/02/2007   OBSTRUCTIVE SLEEP APNEA 05/02/2007   HYPERTENSION 05/02/2007    [name], PTA 04/28/2021, 4:18 PM  [place]. [place], [place], [address] Phone: [phone]   Fax:  [phone]  Name: Daniel [name] MRN: [id number] Date of Birth: Sep 11, 1955

## 2021-05-03 ENCOUNTER — Ambulatory Visit: Payer: Medicare PPO | Admitting: Physical Therapy

## 2021-05-05 ENCOUNTER — Ambulatory Visit: Payer: Medicare PPO | Admitting: Physical Therapy

## 2021-05-05 ENCOUNTER — Other Ambulatory Visit: Payer: Self-pay

## 2021-05-05 DIAGNOSIS — R262 Difficulty in walking, not elsewhere classified: Secondary | ICD-10-CM

## 2021-05-05 DIAGNOSIS — M6281 Muscle weakness (generalized): Secondary | ICD-10-CM

## 2021-05-05 NOTE — Therapy (Signed)
[address]. [place], [place], [address] Phone: [phone]   Fax:  [phone]  Physical Therapy Treatment  Patient Details  Name: Daniel [name] MRN: [id number] Date of Birth: 09-May-1955 Referring Provider (PT): Repass   Encounter Date: 05/05/2021   PT End of Session - 05/05/21 1612     Visit Number 26    Date for PT Re-Evaluation 06/24/20    Authorization Type [employer]    PT Start Time 7672    PT Stop Time 1610    PT Time Calculation (min) 55 min             Past Medical History:  Diagnosis Date   Arthritis    Diabetes mellitus without complication ([place])    Elevated PSA 08/29/2012   7.22   Hyperlipidemia    Hypertension    Obstructive sleep apnea    CPAP   Prostate cancer [facility]) Feb 2014   Spinal stenosis     Past Surgical History:  Procedure Laterality Date   KNEE SURGERY     torn meniscus   PILONIDAL CYST EXCISION     lower spine age 26   PROSTATE BIOPSY Bilateral 08/29/2012   gleason 3+3=6  Dr.Marc Nesi   RADIOACTIVE SEED IMPLANT N/A 11/19/2012   Procedure: RADIOACTIVE SEED IMPLANT;  Surgeon: Hanley Ben, MD;  [facility];  Service: Urology;  Laterality: N/A;   SPINAL CORD DECOMPRESSION     Spinal injection  2012   spinal stenosis   TONSILLECTOMY     as child age 64 or 5    There were no vitals filed for this visit.   Subjective Assessment - 05/05/21 1515     Subjective been a preety rough week with left knee. no pain at rest and pain with walking but comes and goes. been pretty severe and limited my walking. seeing ortho Jan 23. leading woth RT leg helps,shorter stride and lowered walker( more fwd trunk flex and wt through UE) have helped but very limited actvity last week                               [facility] Adult PT Treatment/Exercise - 05/05/21 0001       Lumbar Exercises: Aerobic   UBE (Upper Arm Bike) L 4 3 min fwd/3 min back    Nustep L 6 8 min       Knee/Hip Exercises: Machines for Strengthening   Cybex Knee Extension 20lb 2x15    Cybex Knee Flexion 45# 2x15    Cybex Leg Press 100# 2 sets 15    Other Machine 15# pulley shld ext and row 2 sets10      Knee/Hip Exercises: Seated   Sit to Sand 2 sets;10 reps;without UE support   wt ball                      PT Short Term Goals - 04/28/21 1607       PT SHORT TERM GOAL #1   Title give HEP that he will do at home    Status Achieved               PT Long Term Goals - 04/28/21 1607       PT LONG TERM GOAL #1   Title independent with an advanced HEP    Status Partially Met      PT LONG TERM GOAL #2  Title decrease TUG time to 18 seconds    Status Partially Met      PT LONG TERM GOAL #3   Title walk with a cane or lessed AD x 500 feet    Status Partially Met      PT LONG TERM GOAL #4   Title get up from sitting without using hands    Status Achieved                   Plan - 05/05/21 1612     Clinical Impression Statement been a preety rough week with left knee. no pain at rest and pain with walking but comes and goes. been pretty severe and limited my walking. seeing ortho Jan 23. leading woth RT leg helps,shorter stride and lowered walker( more fwd trunk flex and wt through UE) have helped but very limited actvity last week- progressed as tolerated and decreased wt bearing activities. limited goal progress d/t increased knee pain             Patient will benefit from skilled therapeutic intervention in order to improve the following deficits and impairments:     Visit Diagnosis: Muscle weakness (generalized)  Difficulty in walking, not elsewhere classified     Problem List Patient Active Problem List   Diagnosis Date Noted   Prostate cancer ([place]) 09/26/2012   DYSLIPIDEMIA 05/02/2007   MORBID OBESITY 05/02/2007   OBSTRUCTIVE SLEEP APNEA 05/02/2007   HYPERTENSION 05/02/2007    Cozetta Seif,[name], PTA 05/05/2021, 4:13 PM  [place]. [place], [place], [address] Phone: [phone]   Fax:  [phone]  Name: Daniel [name] MRN: [id number] Date of Birth: 04-04-1956

## 2021-05-10 ENCOUNTER — Other Ambulatory Visit: Payer: Self-pay

## 2021-05-10 ENCOUNTER — Ambulatory Visit: Payer: Medicare PPO | Admitting: Physical Therapy

## 2021-05-10 ENCOUNTER — Encounter: Payer: Self-pay | Admitting: Physical Therapy

## 2021-05-10 DIAGNOSIS — M6281 Muscle weakness (generalized): Secondary | ICD-10-CM | POA: Diagnosis not present

## 2021-05-10 DIAGNOSIS — R262 Difficulty in walking, not elsewhere classified: Secondary | ICD-10-CM

## 2021-05-10 NOTE — Therapy (Signed)
[address]. [place], [place], [address] Phone: [phone]   Fax:  [phone]  Physical Therapy Treatment  Patient Details  Name: Daniel [name] MRN: [id number] Date of Birth: 10-19-1955 Referring Provider (PT): Repass   Encounter Date: 05/10/2021   PT End of Session - 05/10/21 1504     Visit Number 27    Date for PT Re-Evaluation 06/24/20    Authorization Type [employer]    PT Start Time 1408    PT Stop Time 1505    PT Time Calculation (min) 57 min    Activity Tolerance Patient tolerated treatment well    Behavior During Therapy [facility] for tasks assessed/performed             Past Medical History:  Diagnosis Date   Arthritis    Diabetes mellitus without complication ([place])    Elevated PSA 08/29/2012   7.22   Hyperlipidemia    Hypertension    Obstructive sleep apnea    CPAP   Prostate cancer [facility]) Feb 2014   Spinal stenosis     Past Surgical History:  Procedure Laterality Date   KNEE SURGERY     torn meniscus   PILONIDAL CYST EXCISION     lower spine age 26   PROSTATE BIOPSY Bilateral 08/29/2012   gleason 3+3=6  Dr.Marc Nesi   RADIOACTIVE SEED IMPLANT N/A 11/19/2012   Procedure: RADIOACTIVE SEED IMPLANT;  Surgeon: Hanley Ben, MD;  [facility];  Service: Urology;  Laterality: N/A;   SPINAL CORD DECOMPRESSION     Spinal injection  2012   spinal stenosis   TONSILLECTOMY     as child age 83 or 5    There were no vitals filed for this visit.   Subjective Assessment - 05/10/21 1420     Subjective "Good" Knees have come around, see his ortho next week    Currently in Pain? No/denies                               [facility] Adult PT Treatment/Exercise - 05/10/21 0001       High Level Balance   High Level Balance Activities Side stepping      Lumbar Exercises: Aerobic   UBE (Upper Arm Bike) L 4 3 min fwd/3 min back    Nustep L 6 8 min      Lumbar Exercises: Standing    Heel Raises 10 reps;2 seconds   x2, In RW   Row Strengthening;20 reps;Both;Power tower    Row Limitations 15    Other Standing Lumbar Exercises Alt 8 in box taps in RW 2x10      Knee/Hip Exercises: Machines for Strengthening   Cybex Knee Extension 20lb 2x15    Cybex Knee Flexion 55# 2x10      Knee/Hip Exercises: Seated   Sit to Sand 2 sets;10 reps;without UE support                       PT Short Term Goals - 05/10/21 1505       PT SHORT TERM GOAL #1   Title give HEP that he will do at home    Status Achieved               PT Long Term Goals - 05/10/21 1505       PT LONG TERM GOAL #1   Title independent with an advanced  HEP    Status Partially Met      PT LONG TERM GOAL #2   Title decrease TUG time to 18 seconds    Status Partially Met      PT LONG TERM GOAL #3   Title walk with a cane or lessed AD x 500 feet    Status Partially Met                   Plan - 05/10/21 1505     Clinical Impression Statement Pt enters clinic reporting decrease L knee pain. All interventions completed well. He did better overall with weigh bearing activities, despite having some instability when standing. Little heel elevation noted with heel raises. increase resistance tolerated with seated hamstring curls.    Rehab Potential Good    PT Frequency 2x / week    PT Treatment/Interventions ADLs/Self Care Home Management;Gait training;Neuromuscular re-education;Balance training;Therapeutic exercise;Therapeutic activities;Functional mobility training;Stair training;Patient/family education;Manual techniques    PT Next Visit Plan progress exercises for functional strength and balance. submitted for insurance             Patient will benefit from skilled therapeutic intervention in order to improve the following deficits and impairments:  Abnormal gait, Decreased coordination, Decreased range of motion, Difficulty walking, Decreased endurance, Cardiopulmonary  status limiting activity, Decreased activity tolerance, Decreased balance, Improper body mechanics, Postural dysfunction, Decreased strength, Decreased mobility  Visit Diagnosis: Muscle weakness (generalized)  Difficulty in walking, not elsewhere classified     Problem List Patient Active Problem List   Diagnosis Date Noted   Prostate cancer ([place]) 09/26/2012   DYSLIPIDEMIA 05/02/2007   MORBID OBESITY 05/02/2007   OBSTRUCTIVE SLEEP APNEA 05/02/2007   HYPERTENSION 05/02/2007    [name], PTA 05/10/2021, 3:09 PM  [place]. [place], [place], [address] Phone: [phone]   Fax:  [phone]  Name: Daniel [name] MRN: [id number] Date of Birth: Aug 27, 1955

## 2021-05-12 ENCOUNTER — Other Ambulatory Visit: Payer: Self-pay

## 2021-05-12 ENCOUNTER — Ambulatory Visit: Payer: Medicare PPO | Admitting: Physical Therapy

## 2021-05-12 DIAGNOSIS — R262 Difficulty in walking, not elsewhere classified: Secondary | ICD-10-CM

## 2021-05-12 DIAGNOSIS — M6281 Muscle weakness (generalized): Secondary | ICD-10-CM | POA: Diagnosis not present

## 2021-05-12 NOTE — Therapy (Signed)
[place]. [place], [place], [address] Phone: [phone]   Fax:  [phone]  Physical Therapy Treatment  Patient Details  Name: Daniel [name] MRN: [id number] Date of Birth: [birth year] Referring Provider (PT): Repass   Encounter Date: 05/12/2021   PT End of Session - 05/12/21 1527     Visit Number 28    Date for PT Re-Evaluation 06/24/20    Authorization Type [employer]    PT Start Time 0947    PT Stop Time 0962    PT Time Calculation (min) 55 min             Past Medical History:  Diagnosis Date   Arthritis    Diabetes mellitus without complication ([place])    Elevated PSA 08/29/2012   7.22   Hyperlipidemia    Hypertension    Obstructive sleep apnea    CPAP   Prostate cancer [facility]) Feb 2014   Spinal stenosis     Past Surgical History:  Procedure Laterality Date   KNEE SURGERY     torn meniscus   PILONIDAL CYST EXCISION     lower spine age 71   PROSTATE BIOPSY Bilateral 08/29/2012   gleason 3+3=6  Dr.Marc Nesi   RADIOACTIVE SEED IMPLANT N/A 11/19/2012   Procedure: RADIOACTIVE SEED IMPLANT;  Surgeon: Hanley Ben, MD;  [facility];  Service: Urology;  Laterality: N/A;   SPINAL CORD DECOMPRESSION     Spinal injection  2012   spinal stenosis   TONSILLECTOMY     as child age 14 or 5    There were no vitals filed for this visit.   Subjective Assessment - 05/12/21 1435     Subjective doing pretty good. seeing MD for knee Monday    Currently in Pain? No/denies                               [facility] Adult PT Treatment/Exercise - 05/12/21 0001       Lumbar Exercises: Aerobic   UBE (Upper Arm Bike) L 4 3 min fwd/3 min back    Nustep L 6 8 min      Knee/Hip Exercises: Machines for Strengthening   Cybex Knee Extension 20lb 2x15    Cybex Knee Flexion 55# 2x10      Knee/Hip Exercises: Standing   Forward Step Up Both;2 sets;5 reps;Hand Hold: 2;Step Height: 4"   with waker  cuing to use LE vs pushing with arms   Forward Step Up Limitations step tap with 1 hand on walker 4 inch    Walking with Sports Cord 30# RW 5 x fwd and back    Other Standing Knee Exercises Standing march hands over walker for safety 2 sets 10   side stepping with mat behind - increased knee pain so only 1 x each way   Other Standing Knee Exercises blue tband hip 3 way 10 x each and then 10 x alt   with RW     Knee/Hip Exercises: Seated   Sit to Sand 10 reps;without UE support   with wt ball toss once up CGA, several LOB                      PT Short Term Goals - 05/10/21 1505       PT SHORT TERM GOAL #1   Title give HEP that he will do at home  Status Achieved               PT Long Term Goals - 05/12/21 1446       PT LONG TERM GOAL #1   Title independent with an advanced HEP                   Plan - 05/12/21 1528     Clinical Impression Statement revovering from knee set back but did have several times of popping and pain- seeing orthro Monday.making steady progress with goals. working on more artial suported standing. assistane needed. pt works hard and is very motivated    PT Treatment/Interventions ADLs/Self Care Home Management;Gait training;Neuromuscular re-education;Balance training;Therapeutic exercise;Therapeutic activities;Functional mobility training;Stair training;Patient/family education;Manual techniques    PT Next Visit Plan progress exercises for functional strength and balance.             Patient will benefit from skilled therapeutic intervention in order to improve the following deficits and impairments:  Abnormal gait, Decreased coordination, Decreased range of motion, Difficulty walking, Decreased endurance, Cardiopulmonary status limiting activity, Decreased activity tolerance, Decreased balance, Improper body mechanics, Postural dysfunction, Decreased strength, Decreased mobility  Visit Diagnosis: Muscle weakness  (generalized)  Difficulty in walking, not elsewhere classified     Problem List Patient Active Problem List   Diagnosis Date Noted   Prostate cancer ([place]) 09/26/2012   DYSLIPIDEMIA 05/02/2007   MORBID OBESITY 05/02/2007   OBSTRUCTIVE SLEEP APNEA 05/02/2007   HYPERTENSION 05/02/2007    Daniel [name],[name], PTA 05/12/2021, 3:31 PM  [place]. [place], [place], [address] Phone: [phone]   Fax:  [phone]  Name: Daniel [name] MRN: [id number] Date of Birth: Jul 10, 1955

## 2021-05-16 DIAGNOSIS — E119 Type 2 diabetes mellitus without complications: Secondary | ICD-10-CM | POA: Diagnosis not present

## 2021-05-16 DIAGNOSIS — M170 Bilateral primary osteoarthritis of knee: Secondary | ICD-10-CM | POA: Diagnosis not present

## 2021-05-16 DIAGNOSIS — M25562 Pain in left knee: Secondary | ICD-10-CM | POA: Diagnosis not present

## 2021-05-17 ENCOUNTER — Other Ambulatory Visit: Payer: Self-pay

## 2021-05-17 ENCOUNTER — Ambulatory Visit: Payer: Medicare PPO | Admitting: Physical Therapy

## 2021-05-17 ENCOUNTER — Encounter: Payer: Self-pay | Admitting: Physical Therapy

## 2021-05-17 DIAGNOSIS — R262 Difficulty in walking, not elsewhere classified: Secondary | ICD-10-CM

## 2021-05-17 DIAGNOSIS — M6281 Muscle weakness (generalized): Secondary | ICD-10-CM

## 2021-05-17 NOTE — Therapy (Signed)
[address]. [place], [place], [address] Phone: [phone]   Fax:  [phone]  Physical Therapy Treatment  Patient Details  Name: Daniel [name] MRN: [id number] Date of Birth: 06/10/1955 Referring Provider (PT): Repass   Encounter Date: 05/17/2021   PT End of Session - 05/17/21 1506     Visit Number 29    Date for PT Re-Evaluation 06/24/20    PT Start Time 1410    PT Stop Time 1506    PT Time Calculation (min) 56 min    Activity Tolerance Patient tolerated treatment well    Behavior During Therapy [facility] for tasks assessed/performed             Past Medical History:  Diagnosis Date   Arthritis    Diabetes mellitus without complication ([place])    Elevated PSA 08/29/2012   7.22   Hyperlipidemia    Hypertension    Obstructive sleep apnea    CPAP   Prostate cancer [facility]) Feb 2014   Spinal stenosis     Past Surgical History:  Procedure Laterality Date   KNEE SURGERY     torn meniscus   PILONIDAL CYST EXCISION     lower spine age 6   PROSTATE BIOPSY Bilateral 08/29/2012   gleason 3+3=6  Dr.Marc Nesi   RADIOACTIVE SEED IMPLANT N/A 11/19/2012   Procedure: RADIOACTIVE SEED IMPLANT;  Surgeon: Hanley Ben, MD;  [facility];  Service: Urology;  Laterality: N/A;   SPINAL CORD DECOMPRESSION     Spinal injection  2012   spinal stenosis   TONSILLECTOMY     as child age 22 or 5    There were no vitals filed for this visit.   Subjective Assessment - 05/17/21 1415     Subjective Doing pretty good, received injections in both knees yesterday    Currently in Pain? No/denies                               [facility] Adult PT Treatment/Exercise - 05/17/21 0001       Ambulation/Gait   Ambulation/Gait Yes    Ambulation Distance (Feet) 200 Feet    Assistive device Rolling walker    Gait Pattern Step-through pattern    Ambulation Surface Indoor;Level      Lumbar Exercises: Aerobic   UBE  (Upper Arm Bike) L 4 3 min fwd/3 min back    Nustep L 6 8 min      Lumbar Exercises: Standing   Row Strengthening;20 reps;Both;Power tower    Row Limitations 15    Other Standing Lumbar Exercises Alt 8 in box taps in RW x20, x10   3lb cuff   Other Standing Lumbar Exercises OHP yellow ball 2x10      Lumbar Exercises: Seated   Sit to Stand 10 reps   x2     Knee/Hip Exercises: Machines for Strengthening   Cybex Knee Extension 15lb 2x15    Cybex Knee Flexion 45lb 2x15      Knee/Hip Exercises: Standing   Forward Step Up Limitations step tap with 1 hand on walker 4 inch                       PT Short Term Goals - 05/10/21 1505       PT SHORT TERM GOAL #1   Title give HEP that he will do at home    Status  Achieved               PT Long Term Goals - 05/12/21 1446       PT LONG TERM GOAL #1   Title independent with an advanced HEP                   Plan - 05/17/21 1507     Clinical Impression Statement Pt received two injection in his knee yesterday so session modified not to stress LE. Come RLE clipping noted with alt 8 inch box taps. Cue not to use cable with rows for stability. He did really well with sit to stands. Introduced gait trial back into treatment.    Rehab Potential Good    PT Frequency 2x / week    PT Duration 12 weeks    PT Treatment/Interventions ADLs/Self Care Home Management;Gait training;Neuromuscular re-education;Balance training;Therapeutic exercise;Therapeutic activities;Functional mobility training;Stair training;Patient/family education;Manual techniques    PT Next Visit Plan progress exercises for functional strength and balance.             Patient will benefit from skilled therapeutic intervention in order to improve the following deficits and impairments:  Abnormal gait, Decreased coordination, Decreased range of motion, Difficulty walking, Decreased endurance, Cardiopulmonary status limiting activity, Decreased  activity tolerance, Decreased balance, Improper body mechanics, Postural dysfunction, Decreased strength, Decreased mobility  Visit Diagnosis: Muscle weakness (generalized)  Difficulty in walking, not elsewhere classified     Problem List Patient Active Problem List   Diagnosis Date Noted   Prostate cancer ([place]) 09/26/2012   DYSLIPIDEMIA 05/02/2007   MORBID OBESITY 05/02/2007   OBSTRUCTIVE SLEEP APNEA 05/02/2007   HYPERTENSION 05/02/2007    [name], PTA 05/17/2021, 3:09 PM  [place]. [place], [place], [address] Phone: [phone]   Fax:  [phone]  Name: Daniel [name] MRN: [id number] Date of Birth: 1955-05-15

## 2021-05-19 ENCOUNTER — Encounter: Payer: Self-pay | Admitting: Physical Therapy

## 2021-05-19 ENCOUNTER — Ambulatory Visit: Payer: Medicare PPO | Admitting: Physical Therapy

## 2021-05-19 ENCOUNTER — Other Ambulatory Visit: Payer: Self-pay

## 2021-05-19 DIAGNOSIS — M6281 Muscle weakness (generalized): Secondary | ICD-10-CM | POA: Diagnosis not present

## 2021-05-19 DIAGNOSIS — R262 Difficulty in walking, not elsewhere classified: Secondary | ICD-10-CM

## 2021-05-19 NOTE — Therapy (Signed)
[place]. [place], [place], [address] Phone: [phone]   Fax:  [phone]  Physical Therapy Treatment Progress Note Reporting Period 04/12/21 to 05/19/21 for visit 21-30  See note below for Objective Data and Assessment of Progress/Goals.     Patient Details  Name: Daniel [name] MRN: [id number] Date of Birth: 1955/10/04 Referring Provider (PT): Repass   Encounter Date: 05/19/2021   PT End of Session - 05/19/21 1500     Visit Number 30    Date for PT Re-Evaluation 06/24/20    Authorization Type [employer]    PT Start Time 1157    PT Stop Time 1505    PT Time Calculation (min) 50 min             Past Medical History:  Diagnosis Date   Arthritis    Diabetes mellitus without complication ([place])    Elevated PSA 08/29/2012   7.22   Hyperlipidemia    Hypertension    Obstructive sleep apnea    CPAP   Prostate cancer [facility]) Feb 2014   Spinal stenosis     Past Surgical History:  Procedure Laterality Date   KNEE SURGERY     torn meniscus   PILONIDAL CYST EXCISION     lower spine age 40   PROSTATE BIOPSY Bilateral 08/29/2012   gleason 3+3=6  Dr.Marc Nesi   RADIOACTIVE SEED IMPLANT N/A 11/19/2012   Procedure: RADIOACTIVE SEED IMPLANT;  Surgeon: Hanley Ben, MD;  Location: [facility];  Service: Urology;  Laterality: N/A;   SPINAL CORD DECOMPRESSION     Spinal injection  2012   spinal stenosis   TONSILLECTOMY     as child age 19 or 5    There were no vitals filed for this visit.   Subjective Assessment - 05/19/21 1415     Subjective "Doing pretty good" Knees are a little tender    Currently in Pain? No/denies                               [facility] Adult PT Treatment/Exercise - 05/19/21 0001       Ambulation/Gait   Ambulation/Gait Yes    Ambulation Distance (Feet) 200 Feet    Assistive device Rolling walker    Gait Pattern Step-through pattern    Ambulation Surface Level;Indoor     Gait Comments Gait 40 feet with therapist aiding with reciprocal arm swing, Gait without AD or assist  16 ft  x2      High Level Balance   High Level Balance Activities Side stepping    High Level Balance Comments infront of mat table      Lumbar Exercises: Aerobic   UBE (Upper Arm Bike) L 4 3 min fwd/3 min back    Nustep L 6 8 min      Knee/Hip Exercises: Machines for Strengthening   Cybex Knee Extension 20lb 2x15    Cybex Knee Flexion 45lb 2x15      Knee/Hip Exercises: Seated   Sit to Sand 10 reps;without UE support;2 sets;15 reps                       PT Short Term Goals - 05/10/21 1505       PT SHORT TERM GOAL #1   Title give HEP that he will do at home    Status Achieved  PT Long Term Goals - 05/19/21 1500       PT LONG TERM GOAL #1   Title independent with an advanced HEP    Status Achieved      PT LONG TERM GOAL #2   Title decrease TUG time to 18 seconds    Status Partially Met      PT LONG TERM GOAL #3   Title walk with a cane or lessed AD x 500 feet    Status Partially Met      PT LONG TERM GOAL #4   Title get up from sitting without using hands    Status Achieved                   Plan - 05/19/21 1502     Clinical Impression Statement Recent injection in knees has help allowing pt to return to ambulation. Pt able to progress with gait with and without AD or assist. Small short step noted when walking without assisted device requiring CGA. Pt would like to walk with cane at home instead of RW. Bilateral LE fatigue with seated curls and extensions. Pt does have difficulty at times stabilizing with sit to stands.    Rehab Potential Good    PT Treatment/Interventions ADLs/Self Care Home Management;Gait training;Neuromuscular re-education;Balance training;Therapeutic exercise;Therapeutic activities;Functional mobility training;Stair training;Patient/family education;Manual techniques    PT Next Visit Plan progress  exercises for functional strength and balance.             Patient will benefit from skilled therapeutic intervention in order to improve the following deficits and impairments:  Abnormal gait, Decreased coordination, Decreased range of motion, Difficulty walking, Decreased endurance, Cardiopulmonary status limiting activity, Decreased activity tolerance, Decreased balance, Improper body mechanics, Postural dysfunction, Decreased strength, Decreased mobility  Visit Diagnosis: Muscle weakness (generalized)  Difficulty in walking, not elsewhere classified     Problem List Patient Active Problem List   Diagnosis Date Noted   Prostate cancer ([place]) 09/26/2012   DYSLIPIDEMIA 05/02/2007   MORBID OBESITY 05/02/2007   OBSTRUCTIVE SLEEP APNEA 05/02/2007   HYPERTENSION 05/02/2007    [name], PTA 05/19/2021, 3:10 PM  [place]. [place], [place], [address] Phone: [phone]   Fax:  [phone]  Name: Daniel [name] MRN: [id number] Date of Birth: [birth year]

## 2021-05-24 ENCOUNTER — Ambulatory Visit: Payer: Medicare PPO | Admitting: Physical Therapy

## 2021-05-24 ENCOUNTER — Encounter: Payer: Self-pay | Admitting: Physical Therapy

## 2021-05-24 ENCOUNTER — Other Ambulatory Visit: Payer: Self-pay

## 2021-05-24 DIAGNOSIS — R262 Difficulty in walking, not elsewhere classified: Secondary | ICD-10-CM

## 2021-05-24 DIAGNOSIS — M6281 Muscle weakness (generalized): Secondary | ICD-10-CM

## 2021-05-24 NOTE — Therapy (Signed)
[place]. [place], [place], [address] Phone: [phone]   Fax:  [phone]  Physical Therapy Treatment  Patient Details  Name: Clayson Riling MRN: [id number] Date of Birth: 02-16-1956 Referring Provider (PT): Repass   Encounter Date: 05/24/2021   PT End of Session - 05/24/21 1521     Visit Number 31    Date for PT Re-Evaluation 06/24/20    Authorization Type [employer]    PT Start Time 1435    PT Stop Time 1524    PT Time Calculation (min) 49 min    Activity Tolerance Patient tolerated treatment well    Behavior During Therapy [facility] for tasks assessed/performed             Past Medical History:  Diagnosis Date   Arthritis    Diabetes mellitus without complication ([place])    Elevated PSA 08/29/2012   7.22   Hyperlipidemia    Hypertension    Obstructive sleep apnea    CPAP   Prostate cancer [facility]) Feb 2014   Spinal stenosis     Past Surgical History:  Procedure Laterality Date   KNEE SURGERY     torn meniscus   PILONIDAL CYST EXCISION     lower spine age 66   PROSTATE BIOPSY Bilateral 08/29/2012   gleason 3+3=6  Dr.Marc Nesi   RADIOACTIVE SEED IMPLANT N/A 11/19/2012   Procedure: RADIOACTIVE SEED IMPLANT;  Surgeon: Hanley Ben, MD;  [facility];  Service: Urology;  Laterality: N/A;   SPINAL CORD DECOMPRESSION     Spinal injection  2012   spinal stenosis   TONSILLECTOMY     as child age 66 or 66    There were no vitals filed for this visit.   Subjective Assessment - 05/24/21 1433     Subjective Knees still tender. stuggle at times    Currently in Pain? No/denies                               [facility] Adult PT Treatment/Exercise - 05/24/21 0001       High Level Balance   High Level Balance Activities Side stepping    High Level Balance Comments in pbars, in pbars volleyball      Lumbar Exercises: Aerobic   Nustep L 6 8 min      Lumbar Exercises: Seated   Sit to  Stand Limitations sit to stand with 20# Kbell with overhead reach 2x5    Other Seated Lumbar Exercises seated ball slams    Other Seated Lumbar Exercises seated ball hip to hip weightede      Lumbar Exercises: Supine   Other Supine Lumbar Exercises feet on ball K2C, obliques, small bridges and isometrci abs      Knee/Hip Exercises: Machines for Strengthening   Cybex Knee Extension 20lb 2x15    Cybex Knee Flexion 45lb x15, 55 x 15      Knee/Hip Exercises: Standing   Hip Flexion Both;2 sets;10 reps    Hip Flexion Limitations 5#    Hip Abduction Both;2 sets;10 reps    Abduction Limitations 5#    Hip Extension Both;1 set;10 reps    Extension Limitations 5#, caused glute spasms                       PT Short Term Goals - 05/10/21 1505       PT SHORT TERM GOAL #  1   Title give HEP that he will do at home    Status Achieved               PT Long Term Goals - 05/24/21 1524       PT LONG TERM GOAL #2   Title decrease TUG time to 18 seconds    Status Partially Met      PT LONG TERM GOAL #3   Title walk with a cane or lessed AD x 500 feet    Status Partially Met                   Plan - 05/24/21 1522     Clinical Impression Statement Did some work in the [employer], trying to get more strength, had some glute spasms with the standing hip extensions.  Added ball slams and some isometric abs.  He seems to be a little fearful of walking and he is having knee pain and some instability.    PT Next Visit Plan continue to try to push his strength and function, challenge him dynamically    Consulted and Agree with Plan of Care Patient             Patient will benefit from skilled therapeutic intervention in order to improve the following deficits and impairments:  Abnormal gait, Decreased coordination, Decreased range of motion, Difficulty walking, Decreased endurance, Cardiopulmonary status limiting activity, Decreased activity tolerance, Decreased balance,  Improper body mechanics, Postural dysfunction, Decreased strength, Decreased mobility  Visit Diagnosis: Muscle weakness (generalized)  Difficulty in walking, not elsewhere classified     Problem List Patient Active Problem List   Diagnosis Date Noted   Prostate cancer ([place]) 09/26/2012   DYSLIPIDEMIA 05/02/2007   MORBID OBESITY 05/02/2007   OBSTRUCTIVE SLEEP APNEA 05/02/2007   HYPERTENSION 05/02/2007    Sumner Boast, PT 05/24/2021, 3:25 PM  [place]. [place], [place], [address] Phone: [phone]   Fax:  [phone]  Name: Yochanan Eddleman MRN: [id number] Date of Birth: 18-Dec-1955

## 2021-05-26 ENCOUNTER — Encounter: Payer: Self-pay | Admitting: Physical Therapy

## 2021-05-26 ENCOUNTER — Other Ambulatory Visit: Payer: Self-pay

## 2021-05-26 ENCOUNTER — Ambulatory Visit: Payer: Medicare PPO | Attending: *Deleted | Admitting: Physical Therapy

## 2021-05-26 DIAGNOSIS — M6281 Muscle weakness (generalized): Secondary | ICD-10-CM | POA: Insufficient documentation

## 2021-05-26 DIAGNOSIS — R262 Difficulty in walking, not elsewhere classified: Secondary | ICD-10-CM

## 2021-05-26 NOTE — Therapy (Signed)
[place]. [place], [place], [address] Phone: [phone]   Fax:  [phone]  Physical Therapy Treatment  Patient Details  Name: Daniel [name] MRN: [id number] Date of Birth: Apr 21, 1956 Referring Provider (PT): Repass   Encounter Date: 05/26/2021   PT End of Session - 05/26/21 1509     Visit Number 32    Date for PT Re-Evaluation 06/24/20    Authorization Type [employer]    PT Start Time 5697    PT Stop Time 1510    PT Time Calculation (min) 55 min    Activity Tolerance Patient tolerated treatment well    Behavior During Therapy [facility] for tasks assessed/performed             Past Medical History:  Diagnosis Date   Arthritis    Diabetes mellitus without complication ([place])    Elevated PSA 08/29/2012   7.22   Hyperlipidemia    Hypertension    Obstructive sleep apnea    CPAP   Prostate cancer [facility]) Feb 2014   Spinal stenosis     Past Surgical History:  Procedure Laterality Date   KNEE SURGERY     torn meniscus   PILONIDAL CYST EXCISION     lower spine age 66   PROSTATE BIOPSY Bilateral 08/29/2012   gleason 3+3=6  Dr.Marc Nesi   RADIOACTIVE SEED IMPLANT N/A 11/19/2012   Procedure: RADIOACTIVE SEED IMPLANT;  Surgeon: Hanley Ben, MD;  [facility];  Service: Urology;  Laterality: N/A;   SPINAL CORD DECOMPRESSION     Spinal injection  2012   spinal stenosis   TONSILLECTOMY     as child age 66 or 66    There were no vitals filed for this visit.   Subjective Assessment - 05/26/21 1429     Subjective "I feel good"    Currently in Pain? No/denies                               [facility] Adult PT Treatment/Exercise - 05/26/21 0001       Ambulation/Gait   Ambulation/Gait Yes    Ambulation Distance (Feet) 200 Feet    Assistive device Rolling walker    Gait Pattern Step-through pattern    Ambulation Surface Level;Indoor      High Level Balance   High Level Balance Comments  Standing ball toss      Lumbar Exercises: Aerobic   UBE (Upper Arm Bike) L 5 3 min fwd/3 min back    Nustep L 6 8 min      Lumbar Exercises: Seated   Sit to Stand Limitations sit to stand with 20# Kbell with overhead reach 2x5      Knee/Hip Exercises: Machines for Strengthening   Cybex Knee Extension 20lb 2x15    Cybex Knee Flexion 55lb  2x10      Knee/Hip Exercises: Standing   Hip Flexion Both;2 sets;10 reps    Hip Flexion Limitations 5#    Hip Abduction Both;2 sets;10 reps    Abduction Limitations 5#    Hip Extension Both;1 set;10 reps    Extension Limitations 5#, cues to prevent compensation                       PT Short Term Goals - 05/10/21 1505       PT SHORT TERM GOAL #1   Title give HEP that he will do  at home    Status Achieved               PT Long Term Goals - 05/24/21 1524       PT LONG TERM GOAL #2   Title decrease TUG time to 18 seconds    Status Partially Met      PT LONG TERM GOAL #3   Title walk with a cane or lessed AD x 500 feet    Status Partially Met                   Plan - 05/26/21 1510     Clinical Impression Statement Continued with work in parallel bars. Cues to increase bilateral hip and knee flexion needed with marches. Tactile cue needed to prevent forward trunk flexion with hip extensions. Bilateral fatigue present with hip extensions. Very rigid posture with ball tosses    Rehab Potential Good    PT Frequency 2x / week    PT Duration 12 weeks    PT Treatment/Interventions ADLs/Self Care Home Management;Gait training;Neuromuscular re-education;Balance training;Therapeutic exercise;Therapeutic activities;Functional mobility training;Stair training;Patient/family education;Manual techniques    PT Next Visit Plan continue to try to push his strength and function, challenge him dynamically             Patient will benefit from skilled therapeutic intervention in order to improve the following deficits  and impairments:  Abnormal gait, Decreased coordination, Decreased range of motion, Difficulty walking, Decreased endurance, Cardiopulmonary status limiting activity, Decreased activity tolerance, Decreased balance, Improper body mechanics, Postural dysfunction, Decreased strength, Decreased mobility  Visit Diagnosis: Difficulty in walking, not elsewhere classified     Problem List Patient Active Problem List   Diagnosis Date Noted   Prostate cancer ([place]) 09/26/2012   DYSLIPIDEMIA 05/02/2007   MORBID OBESITY 05/02/2007   OBSTRUCTIVE SLEEP APNEA 05/02/2007   HYPERTENSION 05/02/2007    [name], PTA 05/26/2021, 3:14 PM  [place]. [place], [place], [address] Phone: [phone]   Fax:  [phone]  Name: Avrey Hyser MRN: [id number] Date of Birth: 03/01/1956

## 2021-05-31 ENCOUNTER — Other Ambulatory Visit: Payer: Self-pay

## 2021-05-31 ENCOUNTER — Encounter: Payer: Self-pay | Admitting: Physical Therapy

## 2021-05-31 ENCOUNTER — Ambulatory Visit: Payer: Medicare PPO | Admitting: Physical Therapy

## 2021-05-31 DIAGNOSIS — R262 Difficulty in walking, not elsewhere classified: Secondary | ICD-10-CM | POA: Diagnosis not present

## 2021-05-31 DIAGNOSIS — M6281 Muscle weakness (generalized): Secondary | ICD-10-CM | POA: Diagnosis not present

## 2021-05-31 NOTE — Therapy (Signed)
[place]. [place], [place], [address] Phone: [phone]   Fax:  [phone]  Physical Therapy Treatment  Patient Details  Name: Daniel [name] MRN: [id number] Date of Birth: Sep 21, 1955 Referring Provider (PT): Repass   Encounter Date: 05/31/2021   PT End of Session - 05/31/21 1506     Visit Number 33    Date for PT Re-Evaluation 06/24/20    PT Start Time 3559    PT Stop Time 1506    PT Time Calculation (min) 54 min    Activity Tolerance Patient tolerated treatment well    Behavior During Therapy [facility] for tasks assessed/performed             Past Medical History:  Diagnosis Date   Arthritis    Diabetes mellitus without complication ([place])    Elevated PSA 08/29/2012   7.22   Hyperlipidemia    Hypertension    Obstructive sleep apnea    CPAP   Prostate cancer [facility]) Feb 2014   Spinal stenosis     Past Surgical History:  Procedure Laterality Date   KNEE SURGERY     torn meniscus   PILONIDAL CYST EXCISION     lower spine age 70   PROSTATE BIOPSY Bilateral 08/29/2012   gleason 3+3=6  Dr.Marc Nesi   RADIOACTIVE SEED IMPLANT N/A 11/19/2012   Procedure: RADIOACTIVE SEED IMPLANT;  Surgeon: Hanley Ben, MD;  [facility];  Service: Urology;  Laterality: N/A;   SPINAL CORD DECOMPRESSION     Spinal injection  2012   spinal stenosis   TONSILLECTOMY     as child age 42 or 5    There were no vitals filed for this visit.   Subjective Assessment - 05/31/21 1417     Subjective "I feel good"    Currently in Pain? No/denies                               [facility] Adult PT Treatment/Exercise - 05/31/21 0001       Ambulation/Gait   Ambulation/Gait Yes    Ambulation Distance (Feet) 200 Feet    Assistive device Rolling walker    Gait Pattern Step-through pattern    Ambulation Surface Level;Indoor    Gait Comments tactile cues to increase speed      Lumbar Exercises: Aerobic   UBE  (Upper Arm Bike) L 5 3 min fwd/3 min back    Nustep L 6 8 min      Lumbar Exercises: Seated   Sit to Stand Limitations sit to stand with 20# Kbell with overhead reach 2x8      Knee/Hip Exercises: Machines for Strengthening   Cybex Knee Extension 20lb 2x15    Cybex Knee Flexion 55lb  2x10      Knee/Hip Exercises: Standing   Forward Step Up Both;2 sets;5 reps;Hand Hold: 2;Step Height: 4"                       PT Short Term Goals - 05/10/21 1505       PT SHORT TERM GOAL #1   Title give HEP that he will do at home    Status Achieved               PT Long Term Goals - 05/24/21 1524       PT LONG TERM GOAL #2   Title decrease TUG time to 18  seconds    Status Partially Met      PT LONG TERM GOAL #3   Title walk with a cane or lessed AD x 500 feet    Status Partially Met                   Plan - 05/31/21 1508     Clinical Impression Statement At empted ambulation with wide base quad cane but had to discontinue due to increase L knee pain. Increase reps tolerated with sit to stands OHP using kettle bell. Pt continues to fatigue with functional actives. Pt would compensate when stepping up with LLE, pt would keep LLE rigid and pull himself up.    Rehab Potential Good    PT Frequency 2x / week    PT Duration 12 weeks    PT Treatment/Interventions ADLs/Self Care Home Management;Gait training;Neuromuscular re-education;Balance training;Therapeutic exercise;Therapeutic activities;Functional mobility training;Stair training;Patient/family education;Manual techniques    PT Next Visit Plan continue to try to push his strength and function, challenge him dynamically             Patient will benefit from skilled therapeutic intervention in order to improve the following deficits and impairments:  Abnormal gait, Decreased coordination, Decreased range of motion, Difficulty walking, Decreased endurance, Cardiopulmonary status limiting activity, Decreased  activity tolerance, Decreased balance, Improper body mechanics, Postural dysfunction, Decreased strength, Decreased mobility  Visit Diagnosis: Muscle weakness (generalized)     Problem List Patient Active Problem List   Diagnosis Date Noted   Prostate cancer ([place]) 09/26/2012   DYSLIPIDEMIA 05/02/2007   MORBID OBESITY 05/02/2007   OBSTRUCTIVE SLEEP APNEA 05/02/2007   HYPERTENSION 05/02/2007    [name], PTA 05/31/2021, 3:15 PM  [place]. [place], [place], [address] Phone: [phone]   Fax:  [phone]  Name: Tyion Boylen MRN: [id number] Date of Birth: 09/27/1955

## 2021-06-02 ENCOUNTER — Ambulatory Visit: Payer: Medicare PPO | Admitting: Physical Therapy

## 2021-06-07 ENCOUNTER — Ambulatory Visit: Payer: Medicare PPO | Admitting: Physical Therapy

## 2021-06-07 ENCOUNTER — Other Ambulatory Visit: Payer: Self-pay

## 2021-06-07 DIAGNOSIS — R262 Difficulty in walking, not elsewhere classified: Secondary | ICD-10-CM

## 2021-06-07 DIAGNOSIS — M6281 Muscle weakness (generalized): Secondary | ICD-10-CM | POA: Diagnosis not present

## 2021-06-07 NOTE — Therapy (Signed)
[place]. [place], [place], [address] Phone: [phone]   Fax:  [phone]  Physical Therapy Treatment  Patient Details  Name: Daniel [name] MRN: [id number] Date of Birth: 22-Dec-1955 Referring Provider (PT): Repass   Encounter Date: 06/07/2021   PT End of Session - 06/07/21 1440     Visit Number 34    Date for PT Re-Evaluation 06/24/20    Authorization Type [employer]    PT Start Time 6203    PT Stop Time 1440    PT Time Calculation (min) 55 min             Past Medical History:  Diagnosis Date   Arthritis    Diabetes mellitus without complication ([place])    Elevated PSA 08/29/2012   7.22   Hyperlipidemia    Hypertension    Obstructive sleep apnea    CPAP   Prostate cancer [facility]) Feb 2014   Spinal stenosis     Past Surgical History:  Procedure Laterality Date   KNEE SURGERY     torn meniscus   PILONIDAL CYST EXCISION     lower spine age 38   PROSTATE BIOPSY Bilateral 08/29/2012   gleason 3+3=6  Dr.Marc Nesi   RADIOACTIVE SEED IMPLANT N/A 11/19/2012   Procedure: RADIOACTIVE SEED IMPLANT;  Surgeon: Hanley Ben, MD;  [facility];  Service: Urology;  Laterality: N/A;   SPINAL CORD DECOMPRESSION     Spinal injection  2012   spinal stenosis   TONSILLECTOMY     as child age 39 or 5    There were no vitals filed for this visit.   Subjective Assessment - 06/07/21 1344     Subjective doing pretty well    Currently in Pain? Yes    Pain Score 2     Pain Location Knee    Pain Orientation Left                               [facility] Adult PT Treatment/Exercise - 06/07/21 0001       Ambulation/Gait   Gait Comments 75 feet with SBQC CGA. good step thru gait pattern with increased cadeance. amb without AD cg-minA 20 feet 2 x with seaetd rest btwn- less smooth but significantly better than 2 weeks ago      High Level Balance   High Level Balance Activities Marching  forwards;Backward walking;Side stepping   5# in // bars with 1 HHA     Lumbar Exercises: Aerobic   UBE (Upper Arm Bike) L 5 3 min fwd/3 min back    Nustep L 6 8 min      Knee/Hip Exercises: Machines for Strengthening   Cybex Knee Extension 25# BIL 15 x, 20 # 10 x each SL    Cybex Knee Flexion 65# BIL 15 x, 35 # 10 x SL                       PT Short Term Goals - 05/10/21 1505       PT SHORT TERM GOAL #1   Title give HEP that he will do at home    Status Achieved               PT Long Term Goals - 06/07/21 1356       PT LONG TERM GOAL #1   Title independent with an advanced HEP  Baseline evolving as we progress    Status Partially Met      PT LONG TERM GOAL #2   Title decrease TUG time to 18 seconds    Baseline from mat RW  18.7 sec    Status Partially Met      PT LONG TERM GOAL #3   Title walk with a cane or lessed AD x 500 feet    Baseline can walk about 500 feet with RW. 75 feet CGA with SBQC    Status Partially Met      PT LONG TERM GOAL #4   Title get up from sitting without using hands    Baseline varies on height and surface but more consistant    Status Partially Met                   Plan - 06/07/21 1440     Clinical Impression Statement progressing with goals. TUG score improving, strength improving as noted but increased wt on machines. slight set back with left knee pain after increased walking but has seen MD and recieved injections that have helped pain. excellent job to with walking with [facility] and no AD working really hard on step through gait and improved cadeance. Overall pt is doing much better and seeing goo dimprovements with silled interventions and will continue to benefit to increase strength, gait with LRAD and increase func independance.    PT Treatment/Interventions ADLs/Self Care Home Management;Gait training;Neuromuscular re-education;Balance training;Therapeutic exercise;Therapeutic activities;Functional mobility  training;Stair training;Patient/family education;Manual techniques    PT Next Visit Plan continue to try to push his strength and function, challenge him dynamically             Patient will benefit from skilled therapeutic intervention in order to improve the following deficits and impairments:  Abnormal gait, Decreased coordination, Decreased range of motion, Difficulty walking, Decreased endurance, Cardiopulmonary status limiting activity, Decreased activity tolerance, Decreased balance, Improper body mechanics, Postural dysfunction, Decreased strength, Decreased mobility  Visit Diagnosis: Muscle weakness (generalized)  Difficulty in walking, not elsewhere classified     Problem List Patient Active Problem List   Diagnosis Date Noted   Prostate cancer ([place]) 09/26/2012   DYSLIPIDEMIA 05/02/2007   MORBID OBESITY 05/02/2007   OBSTRUCTIVE SLEEP APNEA 05/02/2007   HYPERTENSION 05/02/2007    Rayana Geurin,[name], PTA 06/07/2021, 2:47 PM  [place]. [place], [place], [address] Phone: [phone]   Fax:  [phone]  Name: Ameya Kutz MRN: [id number] Date of Birth: Jan 18, 1956

## 2021-06-09 ENCOUNTER — Encounter: Payer: Self-pay | Admitting: Physical Therapy

## 2021-06-09 ENCOUNTER — Ambulatory Visit: Payer: Medicare PPO | Admitting: Physical Therapy

## 2021-06-09 ENCOUNTER — Other Ambulatory Visit: Payer: Self-pay

## 2021-06-09 DIAGNOSIS — M6281 Muscle weakness (generalized): Secondary | ICD-10-CM

## 2021-06-09 DIAGNOSIS — R262 Difficulty in walking, not elsewhere classified: Secondary | ICD-10-CM | POA: Diagnosis not present

## 2021-06-09 NOTE — Therapy (Signed)
[place]. [place], [place], [address] Phone: [phone]   Fax:  [phone]  Physical Therapy Treatment  Patient Details  Name: Daniel [name] MRN: [id number] Date of Birth: Jul 11, 1955 Referring Provider (PT): Repass   Encounter Date: 06/09/2021   PT End of Session - 06/09/21 1419     Visit Number 35    PT Start Time 1517    PT Stop Time 6160    PT Time Calculation (min) 55 min    Activity Tolerance Patient tolerated treatment well    Behavior During Therapy [facility] for tasks assessed/performed             Past Medical History:  Diagnosis Date   Arthritis    Diabetes mellitus without complication ([place])    Elevated PSA 08/29/2012   7.22   Hyperlipidemia    Hypertension    Obstructive sleep apnea    CPAP   Prostate cancer [facility]) Feb 2014   Spinal stenosis     Past Surgical History:  Procedure Laterality Date   KNEE SURGERY     torn meniscus   PILONIDAL CYST EXCISION     lower spine age 89   PROSTATE BIOPSY Bilateral 08/29/2012   gleason 3+3=6  Dr.Marc Nesi   RADIOACTIVE SEED IMPLANT N/A 11/19/2012   Procedure: RADIOACTIVE SEED IMPLANT;  Surgeon: Hanley Ben, MD;  Location: [facility];  Service: Urology;  Laterality: N/A;   SPINAL CORD DECOMPRESSION     Spinal injection  2012   spinal stenosis   TONSILLECTOMY     as child age 2 or 5    There were no vitals filed for this visit.   Subjective Assessment - 06/09/21 1329     Subjective "I am good"    Currently in Pain? No/denies                               [facility] Adult PT Treatment/Exercise - 06/09/21 0001       Ambulation/Gait   Gait Comments 75 feet with SBQC CGA. good step thru gait pattern with increased cadeance. amb without AD cg-minA 20 feet  then 28 feet  with seaetd rest btwn- less smooth but significantly better than 2 weeks ago      Lumbar Exercises: Aerobic   UBE (Upper Arm Bike) L 5 3 min fwd/3 min back     Nustep L 6 8 min      Lumbar Exercises: Seated   Sit to Stand 10 reps;Other (comment)   x2     Knee/Hip Exercises: Machines for Strengthening   Cybex Knee Extension 25# BIL 15 x, 20 # 10 x each SL    Cybex Knee Flexion 65# BIL 15 x, 35 # 10 x SL      Knee/Hip Exercises: Standing   Other Standing Knee Exercises Standing march SBQC 2x10                       PT Short Term Goals - 05/10/21 1505       PT SHORT TERM GOAL #1   Title give HEP that he will do at home    Status Achieved               PT Long Term Goals - 06/07/21 1356       PT LONG TERM GOAL #1   Title independent with an advanced HEP  Baseline evolving as we progress    Status Partially Met      PT LONG TERM GOAL #2   Title decrease TUG time to 18 seconds    Baseline from mat RW  18.7 sec    Status Partially Met      PT LONG TERM GOAL #3   Title walk with a cane or lessed AD x 500 feet    Baseline can walk about 500 feet with RW. 75 feet CGA with SBQC    Status Partially Met      PT LONG TERM GOAL #4   Title get up from sitting without using hands    Baseline varies on height and surface but more consistant    Status Partially Met                   Plan - 06/09/21 1421     Clinical Impression Statement Good carryover form last session ambulating without AD. No reports of L knee pain during session. Cue to increase ROM with seated leg curls and hamstring curls. So instability noted today with standing marches using SBQC. some momentum used with sit to stands.    Rehab Potential Good    PT Frequency 2x / week    PT Duration 12 weeks    PT Treatment/Interventions ADLs/Self Care Home Management;Gait training;Neuromuscular re-education;Balance training;Therapeutic exercise;Therapeutic activities;Functional mobility training;Stair training;Patient/family education;Manual techniques    PT Next Visit Plan continue to try to push his strength and function, challenge him dynamically              Patient will benefit from skilled therapeutic intervention in order to improve the following deficits and impairments:  Abnormal gait, Decreased coordination, Decreased range of motion, Difficulty walking, Decreased endurance, Cardiopulmonary status limiting activity, Decreased activity tolerance, Decreased balance, Improper body mechanics, Postural dysfunction, Decreased strength, Decreased mobility  Visit Diagnosis: Difficulty in walking, not elsewhere classified  Muscle weakness (generalized)     Problem List Patient Active Problem List   Diagnosis Date Noted   Prostate cancer ([place]) 09/26/2012   DYSLIPIDEMIA 05/02/2007   MORBID OBESITY 05/02/2007   OBSTRUCTIVE SLEEP APNEA 05/02/2007   HYPERTENSION 05/02/2007    [name], PTA 06/09/2021, 2:23 PM  [place]. [place], [place], [address] Phone: [phone]   Fax:  [phone]  Name: Daniel [name] MRN: [id number] Date of Birth: Jun 13, 1955

## 2021-06-14 ENCOUNTER — Encounter: Payer: Self-pay | Admitting: Physical Therapy

## 2021-06-14 ENCOUNTER — Ambulatory Visit: Payer: Medicare PPO | Admitting: Physical Therapy

## 2021-06-14 ENCOUNTER — Other Ambulatory Visit: Payer: Self-pay

## 2021-06-14 DIAGNOSIS — M6281 Muscle weakness (generalized): Secondary | ICD-10-CM

## 2021-06-14 DIAGNOSIS — R262 Difficulty in walking, not elsewhere classified: Secondary | ICD-10-CM | POA: Diagnosis not present

## 2021-06-14 NOTE — Therapy (Signed)
[place]. [place], [place], [address] Phone: [phone]   Fax:  [phone]  Physical Therapy Treatment  Patient Details  Name: Daniel [name] MRN: [id number] Date of Birth: 08/21/1955 Referring Provider (PT): Repass   Encounter Date: 06/14/2021   PT End of Session - 06/14/21 1509     Visit Number 36    Authorization Type [employer]    PT Start Time 2992    PT Stop Time 1510    PT Time Calculation (min) 55 min    Activity Tolerance Patient tolerated treatment well    Behavior During Therapy [facility] for tasks assessed/performed             Past Medical History:  Diagnosis Date   Arthritis    Diabetes mellitus without complication ([place])    Elevated PSA 08/29/2012   7.22   Hyperlipidemia    Hypertension    Obstructive sleep apnea    CPAP   Prostate cancer [facility]) Feb 2014   Spinal stenosis     Past Surgical History:  Procedure Laterality Date   KNEE SURGERY     torn meniscus   PILONIDAL CYST EXCISION     lower spine age 68   PROSTATE BIOPSY Bilateral 08/29/2012   gleason 3+3=6  Dr.Marc Nesi   RADIOACTIVE SEED IMPLANT N/A 11/19/2012   Procedure: RADIOACTIVE SEED IMPLANT;  Surgeon: Hanley Ben, MD;  [facility];  Service: Urology;  Laterality: N/A;   SPINAL CORD DECOMPRESSION     Spinal injection  2012   spinal stenosis   TONSILLECTOMY     as child age 61 or 5    There were no vitals filed for this visit.   Subjective Assessment - 06/14/21 1429     Subjective "Feeling great"    Currently in Pain? No/denies                               [facility] Adult PT Treatment/Exercise - 06/14/21 0001       Ambulation/Gait   Gait Comments amb without AD cg-minA 28 feet x4 with seaetd rest btwn-      Neuro Re-ed    Neuro Re-ed Details  standing ball toss      Lumbar Exercises: Aerobic   UBE (Upper Arm Bike) L 5 3 min fwd/3 min back    Nustep L 6 8 min      Lumbar Exercises:  Seated   Sit to Stand Limitations sit to stand with 20# Kbell with overhead reach 3x5      Knee/Hip Exercises: Machines for Strengthening   Cybex Knee Extension 25# BIL 15 x, 20 # 10 x each SL    Cybex Knee Flexion 65# BIL 15 x, 35 # 10 x SL      Knee/Hip Exercises: Standing   Other Standing Knee Exercises *lb 8in box taps 5lb w/ RW 2x10                       PT Short Term Goals - 05/10/21 1505       PT SHORT TERM GOAL #1   Title give HEP that he will do at home    Status Achieved               PT Long Term Goals - 06/14/21 1510       PT LONG TERM GOAL #1   Title independent with  an advanced HEP    Status Achieved      PT LONG TERM GOAL #2   Title decrease TUG time to 18 seconds    Status Partially Met                   Plan - 06/14/21 1511     Clinical Impression Statement Pt has progressed towards goals increasing his gait distance and tolerance without AS. cue needed to relax UE and allow reciprocal arm swing. Pt has shown improved gait over the last few weeks. some difficulty with sit to stands holding 20 lb kb. Cues to complete full ROM with leg curls and extensions.    Rehab Potential Good    PT Frequency 2x / week    PT Duration 12 weeks    PT Treatment/Interventions ADLs/Self Care Home Management;Gait training;Neuromuscular re-education;Balance training;Therapeutic exercise;Therapeutic activities;Functional mobility training;Stair training;Patient/family education;Manual techniques             Patient will benefit from skilled therapeutic intervention in order to improve the following deficits and impairments:  Abnormal gait, Decreased coordination, Decreased range of motion, Difficulty walking, Decreased endurance, Cardiopulmonary status limiting activity, Decreased activity tolerance, Decreased balance, Improper body mechanics, Postural dysfunction, Decreased strength, Decreased mobility  Visit Diagnosis: Difficulty in walking,  not elsewhere classified  Muscle weakness (generalized)     Problem List Patient Active Problem List   Diagnosis Date Noted   Prostate cancer ([place]) 09/26/2012   DYSLIPIDEMIA 05/02/2007   MORBID OBESITY 05/02/2007   OBSTRUCTIVE SLEEP APNEA 05/02/2007   HYPERTENSION 05/02/2007    [name], PTA 06/14/2021, 3:13 PM  [place]. [place], [place], [address] Phone: [phone]   Fax:  [phone]  Name: Daniel [name] MRN: [id number] Date of Birth: [birth year]

## 2021-06-16 ENCOUNTER — Encounter: Payer: Self-pay | Admitting: Physical Therapy

## 2021-06-16 ENCOUNTER — Ambulatory Visit: Payer: Medicare PPO | Admitting: Physical Therapy

## 2021-06-16 ENCOUNTER — Other Ambulatory Visit: Payer: Self-pay

## 2021-06-16 DIAGNOSIS — R262 Difficulty in walking, not elsewhere classified: Secondary | ICD-10-CM | POA: Diagnosis not present

## 2021-06-16 DIAGNOSIS — M6281 Muscle weakness (generalized): Secondary | ICD-10-CM

## 2021-06-16 NOTE — Therapy (Signed)
[place]. [place], [place], [address] Phone: [phone]   Fax:  [phone]  Physical Therapy Treatment  Patient Details  Name: Ledarius Leeson MRN: [id number] Date of Birth: 1955-09-08 Referring Provider (PT): Repass   Encounter Date: 06/16/2021   PT End of Session - 06/16/21 1552     Visit Number 37    Date for PT Re-Evaluation 06/24/20    Authorization Type [employer]    PT Start Time 1505    PT Stop Time 1555    PT Time Calculation (min) 50 min    Activity Tolerance Patient tolerated treatment well    Behavior During Therapy [facility] for tasks assessed/performed             Past Medical History:  Diagnosis Date   Arthritis    Diabetes mellitus without complication ([place])    Elevated PSA 08/29/2012   7.22   Hyperlipidemia    Hypertension    Obstructive sleep apnea    CPAP   Prostate cancer [facility]) Feb 2014   Spinal stenosis     Past Surgical History:  Procedure Laterality Date   KNEE SURGERY     torn meniscus   PILONIDAL CYST EXCISION     lower spine age 89   PROSTATE BIOPSY Bilateral 08/29/2012   gleason 3+3=6  Dr.Marc Nesi   RADIOACTIVE SEED IMPLANT N/A 11/19/2012   Procedure: RADIOACTIVE SEED IMPLANT;  Surgeon: Hanley Ben, MD;  [facility];  Service: Urology;  Laterality: N/A;   SPINAL CORD DECOMPRESSION     Spinal injection  2012   spinal stenosis   TONSILLECTOMY     as child age 82 or 5    There were no vitals filed for this visit.   Subjective Assessment - 06/16/21 1513     Subjective Some fatigue today, didn't get a good night sleep    Currently in Pain? No/denies                               [facility] Adult PT Treatment/Exercise - 06/16/21 0001       Ambulation/Gait   Gait Comments amb without AD cg-minA 28 feet x4 with seaetd rest btwn-      Lumbar Exercises: Aerobic   UBE (Upper Arm Bike) L 5 3 min fwd/3 min back    Nustep L 6 8 min      Lumbar  Exercises: Seated   Sit to Stand Limitations sit to stand blue ball 2x10      Knee/Hip Exercises: Machines for Strengthening   Cybex Knee Extension 20lb 2x12    Cybex Knee Flexion 55lb  2x12                       PT Short Term Goals - 05/10/21 1505       PT SHORT TERM GOAL #1   Title give HEP that he will do at home    Status Achieved               PT Long Term Goals - 06/14/21 1510       PT LONG TERM GOAL #1   Title independent with an advanced HEP    Status Achieved      PT LONG TERM GOAL #2   Title decrease TUG time to 18 seconds    Status Partially Met  Plan - 06/16/21 1554     Clinical Impression Statement Good carryover form last session ambulating without AD. Cues needed to relax shoulders and allow reciprocal arm swing. Additional reps of sit to stands was taxing on pt requiring him to rest during sets. Decreased load with leg curls and extension for greater ROM.    Rehab Potential Good    PT Frequency 2x / week    PT Duration 12 weeks    PT Treatment/Interventions ADLs/Self Care Home Management;Gait training;Neuromuscular re-education;Balance training;Therapeutic exercise;Therapeutic activities;Functional mobility training;Stair training;Patient/family education;Manual techniques    PT Next Visit Plan continue to try to push his strength and function, challenge him dynamically             Patient will benefit from skilled therapeutic intervention in order to improve the following deficits and impairments:  Abnormal gait, Decreased coordination, Decreased range of motion, Difficulty walking, Decreased endurance, Cardiopulmonary status limiting activity, Decreased activity tolerance, Decreased balance, Improper body mechanics, Postural dysfunction, Decreased strength, Decreased mobility  Visit Diagnosis: Muscle weakness (generalized)  Difficulty in walking, not elsewhere classified     Problem List Patient  Active Problem List   Diagnosis Date Noted   Prostate cancer ([place]) 09/26/2012   DYSLIPIDEMIA 05/02/2007   MORBID OBESITY 05/02/2007   OBSTRUCTIVE SLEEP APNEA 05/02/2007   HYPERTENSION 05/02/2007    [name], PTA 06/16/2021, 3:57 PM  [place]. [place], [place], [address] Phone: [phone]   Fax:  [phone]  Name: Raekwan Spelman MRN: [id number] Date of Birth: [birth year]

## 2021-06-21 ENCOUNTER — Ambulatory Visit: Payer: Medicare PPO | Admitting: Physical Therapy

## 2021-06-21 ENCOUNTER — Other Ambulatory Visit: Payer: Self-pay

## 2021-06-21 DIAGNOSIS — R262 Difficulty in walking, not elsewhere classified: Secondary | ICD-10-CM | POA: Diagnosis not present

## 2021-06-21 DIAGNOSIS — M6281 Muscle weakness (generalized): Secondary | ICD-10-CM | POA: Diagnosis not present

## 2021-06-21 NOTE — Therapy (Signed)
[place]. [place], [place], [address] Phone: [phone]   Fax:  [phone]  Physical Therapy Treatment  Patient Details  Name: Daniel [name] MRN: [id number] Date of Birth: 1956/02/06 Referring Provider (PT): Repass   Encounter Date: 06/21/2021   PT End of Session - 06/21/21 1435     Visit Number 38    Date for PT Re-Evaluation 06/24/20    Authorization Type [employer]    PT Start Time 1350    PT Stop Time 1450    PT Time Calculation (min) 60 min             Past Medical History:  Diagnosis Date   Arthritis    Diabetes mellitus without complication ([place])    Elevated PSA 08/29/2012   7.22   Hyperlipidemia    Hypertension    Obstructive sleep apnea    CPAP   Prostate cancer [facility]) Feb 2014   Spinal stenosis     Past Surgical History:  Procedure Laterality Date   KNEE SURGERY     torn meniscus   PILONIDAL CYST EXCISION     lower spine age 52   PROSTATE BIOPSY Bilateral 08/29/2012   gleason 3+3=6  Dr.Marc Nesi   RADIOACTIVE SEED IMPLANT N/A 11/19/2012   Procedure: RADIOACTIVE SEED IMPLANT;  Surgeon: Hanley Ben, MD;  [facility];  Service: Urology;  Laterality: N/A;   SPINAL CORD DECOMPRESSION     Spinal injection  2012   spinal stenosis   TONSILLECTOMY     as child age 73 or 5    There were no vitals filed for this visit.   Subjective Assessment - 06/21/21 1403     Subjective tennitis in ear    Currently in Pain? No/denies                               [facility] Adult PT Treatment/Exercise - 06/21/21 0001       Ambulation/Gait   Gait Comments amb 75 feet circle without AD, no stopping, no LOB and no reaching for objects to steady CGA   2x     High Level Balance   High Level Balance Activities Marching forwards;Backward walking;Side stepping   HH Aon large foam mat     Lumbar Exercises: Aerobic   UBE (Upper Arm Bike) L 5 3 min fwd/3 min back    Nustep L 6 8  min      Lumbar Exercises: Standing   Other Standing Lumbar Exercises step tap 4 in 2 sets 5 CGA   1 set hand on walker, 1 set no UE support     Lumbar Exercises: Seated   Other Seated Lumbar Exercises sit to stand 2 sets 5 on airex      Knee/Hip Exercises: Machines for Strengthening   Cybex Leg Press 100# 1 set 15, 120 # 15 x                       PT Short Term Goals - 05/10/21 1505       PT SHORT TERM GOAL #1   Title give HEP that he will do at home    Status Achieved               PT Long Term Goals - 06/14/21 1510       PT LONG TERM GOAL #1   Title independent with an advanced  HEP    Status Achieved      PT LONG TERM GOAL #2   Title decrease TUG time to 18 seconds    Status Partially Met                   Plan - 06/21/21 1436     Clinical Impression Statement progressed gait without AD good cadeance and stride 75 feet 2 x CGA without LOB. added ydnamic surface training in today and did well but very fearful and needed assistane. pt able to do toe tap with SLS . pt is making steady progress and good carry over with skilled interventions    PT Treatment/Interventions ADLs/Self Care Home Management;Gait training;Neuromuscular re-education;Balance training;Therapeutic exercise;Therapeutic activities;Functional mobility training;Stair training;Patient/family education;Manual techniques    PT Next Visit Plan continue to try to push his strength and function, challenge him dynamically             Patient will benefit from skilled therapeutic intervention in order to improve the following deficits and impairments:  Abnormal gait, Decreased coordination, Decreased range of motion, Difficulty walking, Decreased endurance, Cardiopulmonary status limiting activity, Decreased activity tolerance, Decreased balance, Improper body mechanics, Postural dysfunction, Decreased strength, Decreased mobility  Visit Diagnosis: Muscle weakness  (generalized)  Difficulty in walking, not elsewhere classified     Problem List Patient Active Problem List   Diagnosis Date Noted   Prostate cancer ([place]) 09/26/2012   DYSLIPIDEMIA 05/02/2007   MORBID OBESITY 05/02/2007   OBSTRUCTIVE SLEEP APNEA 05/02/2007   HYPERTENSION 05/02/2007    Aaleyah Witherow,[name], PTA 06/21/2021, 2:45 PM  [place]. [place], [place], [address] Phone: [phone]   Fax:  [phone]  Name: Sheehan Stacey MRN: [id number] Date of Birth: 07-Nov-1955

## 2021-06-23 ENCOUNTER — Encounter: Payer: Self-pay | Admitting: Physical Therapy

## 2021-06-23 ENCOUNTER — Other Ambulatory Visit: Payer: Self-pay

## 2021-06-23 ENCOUNTER — Ambulatory Visit: Payer: Medicare PPO | Attending: *Deleted | Admitting: Physical Therapy

## 2021-06-23 DIAGNOSIS — R262 Difficulty in walking, not elsewhere classified: Secondary | ICD-10-CM | POA: Insufficient documentation

## 2021-06-23 DIAGNOSIS — M6281 Muscle weakness (generalized): Secondary | ICD-10-CM | POA: Diagnosis not present

## 2021-06-23 NOTE — Therapy (Signed)
[place] ?[place] ?[address] ?[place], [place], [address] ?Phone: [phone]   Fax:  [phone] ? ?Physical Therapy Treatment ? ?Patient Details  ?Name: Daniel [name] ?MRN: [id number] ?Date of Birth: 05-04-1955 ?Referring Provider (PT): Repass ? ? ?Encounter Date: 06/23/2021 ? ? PT End of Session - 06/23/21 1552   ? ? Visit Number 39   ? Date for PT Re-Evaluation 06/24/20   ? PT Start Time 1501   ? PT Stop Time 5625   ? PT Time Calculation (min) 54 min   ? Activity Tolerance Patient tolerated treatment well   ? Behavior During Therapy [facility] for tasks assessed/performed   ? ?  ?  ? ?  ? ? ?Past Medical History:  ?Diagnosis Date  ? Arthritis   ? Diabetes mellitus without complication ([place])   ? Elevated PSA 08/29/2012  ? 7.22  ? Hyperlipidemia   ? Hypertension   ? Obstructive sleep apnea   ? CPAP  ? Prostate cancer [facility]) Feb 2014  ? Spinal stenosis   ? ? ?Past Surgical History:  ?Procedure Laterality Date  ? KNEE SURGERY    ? torn meniscus  ? PILONIDAL CYST EXCISION    ? lower spine age 87  ? PROSTATE BIOPSY Bilateral 08/29/2012  ? gleason 3+3=6  Dr.Marc Nesi  ? RADIOACTIVE SEED IMPLANT N/A 11/19/2012  ? Procedure: RADIOACTIVE SEED IMPLANT;  Surgeon: Hanley Ben, MD;  [facility];  Service: Urology;  Laterality: N/A;  ? SPINAL CORD DECOMPRESSION    ? Spinal injection  2012  ? spinal stenosis  ? TONSILLECTOMY    ? as child age 12 or 67  ? ? ?There were no vitals filed for this visit. ? ? Subjective Assessment - 06/23/21 1505   ? ? Subjective Good, a little tenderness in the knees   ? Currently in Pain? No/denies   ? ?  ?  ? ?  ? ? ? ? ? ? ? ? ? ? ? ? ? ? ? ? ? ? ? ? [place] Adult PT Treatment/Exercise - 06/23/21 0001   ? ?  ? Ambulation/Gait  ? Gait Comments amb 75 feet circle without AD, no stopping, no LOB and no reaching for objects to steady CGA   ?  ? Lumbar Exercises: Aerobic  ? UBE (Upper Arm Bike) L 5 3 min fwd/3 min back   ? Nustep L 6 8 min   ?  ? Lumbar Exercises:  Seated  ? Other Seated Lumbar Exercises sit to stand 3 sets 5 on airex   ?  ? Knee/Hip Exercises: Machines for Strengthening  ? Cybex Knee Extension 20lb 2x12   ? Cybex Knee Flexion 55lb  2x12   ?  ? Knee/Hip Exercises: Standing  ? Other Standing Knee Exercises Standing march SBQC 2x10 on airex   ? ?  ?  ? ?  ? ? ? ? ? ? ? ? ? ? ? ? PT Short Term Goals - 05/10/21 1505   ? ?  ? PT SHORT TERM GOAL #1  ? Title give HEP that he will do at home   ? Status Achieved   ? ?  ?  ? ?  ? ? ? ? PT Long Term Goals - 06/14/21 1510   ? ?  ? PT LONG TERM GOAL #1  ? Title independent with an advanced HEP   ? Status Achieved   ?  ? PT LONG TERM GOAL #2  ? Title  decrease TUG time to 18 seconds   ? Status Partially Met   ? ?  ?  ? ?  ? ? ? ? ? ? ? ? Plan - 06/23/21 1555   ? ? Clinical Impression Statement Good carryover form last session ambulating without AD. Some difficulty with standing marches on aires with [facility] but he was able to complete with some assist. Cue for full ROM needed with seated hamstring curls. No report of pain. pt continues to make good progress   ? Rehab Potential Good   ? PT Frequency 2x / week   ? PT Duration 12 weeks   ? PT Treatment/Interventions ADLs/Self Care Home Management;Gait training;Neuromuscular re-education;Balance training;Therapeutic exercise;Therapeutic activities;Functional mobility training;Stair training;Patient/family education;Manual techniques   ? PT Next Visit Plan continue to try to push his strength and function, challenge him dynamically   ? ?  ?  ? ?  ? ? ?Patient will benefit from skilled therapeutic intervention in order to improve the following deficits and impairments:  Abnormal gait, Decreased coordination, Decreased range of motion, Difficulty walking, Decreased endurance, Cardiopulmonary status limiting activity, Decreased activity tolerance, Decreased balance, Improper body mechanics, Postural dysfunction, Decreased strength, Decreased mobility ? ?Visit Diagnosis: ?Muscle  weakness (generalized) ? ?Difficulty in walking, not elsewhere classified ? ? ? ? ?Problem List ?Patient Active Problem List  ? Diagnosis Date Noted  ? Prostate cancer ([place]) 09/26/2012  ? DYSLIPIDEMIA 05/02/2007  ? MORBID OBESITY 05/02/2007  ? OBSTRUCTIVE SLEEP APNEA 05/02/2007  ? HYPERTENSION 05/02/2007  ? ? ?[name], PTA ?06/23/2021, 3:57 PM ? ?[place] ?[address] ?[address] ?[place], [place], [address] ?Phone: [phone]   Fax:  [phone] ? ?Name: Cauy Melody ?MRN: [id number] ?Date of Birth: Aug 31, 1955 ? ? ? ?

## 2021-06-28 ENCOUNTER — Encounter: Payer: Self-pay | Admitting: Physical Therapy

## 2021-06-28 ENCOUNTER — Other Ambulatory Visit: Payer: Self-pay

## 2021-06-28 ENCOUNTER — Ambulatory Visit: Payer: Medicare PPO | Admitting: Physical Therapy

## 2021-06-28 DIAGNOSIS — M6281 Muscle weakness (generalized): Secondary | ICD-10-CM

## 2021-06-28 DIAGNOSIS — R262 Difficulty in walking, not elsewhere classified: Secondary | ICD-10-CM

## 2021-06-28 NOTE — Therapy (Signed)
[place] ?[place] ?[address] ?[place], [place], [address] ?Phone: [phone]   Fax:  [phone] ? ?Physical Therapy Treatment ? ?Patient Details  ?Name: Daniel [name] ?MRN: [id number] ?Date of Birth: 04-15-1956 ?Referring Provider (PT): Repass ? ? ?Encounter Date: 06/28/2021 ? ? PT End of Session - 06/28/21 1507   ? ? Visit Number 40   ? Date for PT Re-Evaluation 06/24/20   ? PT Start Time 1412   ? PT Stop Time 7628   ? PT Time Calculation (min) 56 min   ? Activity Tolerance Patient tolerated treatment well   ? Behavior During Therapy [facility] for tasks assessed/performed   ? ?  ?  ? ?  ? ? ?Past Medical History:  ?Diagnosis Date  ? Arthritis   ? Diabetes mellitus without complication ([place])   ? Elevated PSA 08/29/2012  ? 7.22  ? Hyperlipidemia   ? Hypertension   ? Obstructive sleep apnea   ? CPAP  ? Prostate cancer [facility]) Feb 2014  ? Spinal stenosis   ? ? ?Past Surgical History:  ?Procedure Laterality Date  ? KNEE SURGERY    ? torn meniscus  ? PILONIDAL CYST EXCISION    ? lower spine age 38  ? PROSTATE BIOPSY Bilateral 08/29/2012  ? gleason 3+3=6  Dr.Marc Nesi  ? RADIOACTIVE SEED IMPLANT N/A 11/19/2012  ? Procedure: RADIOACTIVE SEED IMPLANT;  Surgeon: Hanley Ben, MD;  Location: [facility];  Service: Urology;  Laterality: N/A;  ? SPINAL CORD DECOMPRESSION    ? Spinal injection  2012  ? spinal stenosis  ? TONSILLECTOMY    ? as child age 19 or 89  ? ? ?There were no vitals filed for this visit. ? ? Subjective Assessment - 06/28/21 1414   ? ? Subjective "great"   ? Currently in Pain? No/denies   ? ?  ?  ? ?  ? ? ? ? ? [facility] PT Assessment - 06/28/21 0001   ? ?  ? Strength  ? Right Hip Flexion 4/5   ? Right Hip Extension 4/5   ? Right Hip ABduction 4/5   ? Left Hip Flexion 4/5   ? Left Hip ABduction 4/5   ? Right Knee Flexion 4/5   ? Left Knee Flexion 4/5   ? Right Ankle Dorsiflexion 4-/5   ? Right Ankle Plantar Flexion 4-/5   ? Right Ankle Inversion 4/5   ? Right Ankle Eversion 4/5   ?  Left Ankle Dorsiflexion 4-/5   ? Left Ankle Plantar Flexion 4-/5   ? Left Ankle Inversion 4/5   ? Left Ankle Eversion 4/5   ? ?  ?  ? ?  ? ? ? ? ? ? ? ? ? ? ? ? ? ? ? ? [place] Adult PT Treatment/Exercise - 06/28/21 0001   ? ?  ? Ambulation/Gait  ? Gait Comments amb 75 feet circle without AD, no stopping, no LOB and no reaching for objects to steady CGA   ?  ? High Level Balance  ? High Level Balance Activities Backward walking   5' x2  ?  ? Lumbar Exercises: Aerobic  ? UBE (Upper Arm Bike) L 5 3 min fwd/3 min back   ? Nustep L 6 8 min   ?  ? Lumbar Exercises: Standing  ? Other Standing Lumbar Exercises OHP blue ball 2x10   ?  ? Lumbar Exercises: Seated  ? Other Seated Lumbar Exercises sit to stand 3 sets 5 on  airex   ?  ? Knee/Hip Exercises: Machines for Strengthening  ? Cybex Knee Extension 20lb 2x12   ? Cybex Knee Flexion 55lb  2x12   ? ?  ?  ? ?  ? ? ? ? ? ? ? ? ? ? ? ? PT Short Term Goals - 05/10/21 1505   ? ?  ? PT SHORT TERM GOAL #1  ? Title give HEP that he will do at home   ? Status Achieved   ? ?  ?  ? ?  ? ? ? ? PT Long Term Goals - 06/28/21 1508   ? ?  ? PT LONG TERM GOAL #1  ? Title independent with an advanced HEP   ? Status Achieved   ?  ? PT LONG TERM GOAL #2  ? Title decrease TUG time to 18 seconds   ? Status Partially Met   ?  ? PT LONG TERM GOAL #3  ? Title walk with a cane or lessed AD x 500 feet   ? Status Partially Met   ?  ? PT LONG TERM GOAL #4  ? Title get up from sitting without using hands   ? Status Achieved   ? ?  ?  ? ?  ? ? ? ? ? ? ? ? Plan - 06/28/21 1510   ? ? Clinical Impression Statement Pt continues to do well ambulating without AD, he had one bout of LOB during gait but able to correct. Pt reports improved mobility at home. Good stability performed standing OHP with weighted ball. Some knee discomfort with backwards walking. Reports fear sitting in his sofa at home, not being able to get up. Reports improved strength since starting therapy. He ha increased his LE strength overall.    ? Rehab Potential Good   ? PT Frequency 2x / week   ? PT Duration 12 weeks   ? PT Treatment/Interventions ADLs/Self Care Home Management;Gait training;Neuromuscular re-education;Balance training;Therapeutic exercise;Therapeutic activities;Functional mobility training;Stair training;Patient/family education;Manual techniques   ? PT Next Visit Plan continue to try to push his strength and function, challenge him dynamically   ? ?  ?  ? ?  ? ? ?Patient will benefit from skilled therapeutic intervention in order to improve the following deficits and impairments:  Abnormal gait, Decreased coordination, Decreased range of motion, Difficulty walking, Decreased endurance, Cardiopulmonary status limiting activity, Decreased activity tolerance, Decreased balance, Improper body mechanics, Postural dysfunction, Decreased strength, Decreased mobility ? ?Visit Diagnosis: ?Difficulty in walking, not elsewhere classified ? ?Muscle weakness (generalized) ? ? ? ? ?Problem List ?Patient Active Problem List  ? Diagnosis Date Noted  ? Prostate cancer ([place]) 09/26/2012  ? DYSLIPIDEMIA 05/02/2007  ? MORBID OBESITY 05/02/2007  ? OBSTRUCTIVE SLEEP APNEA 05/02/2007  ? HYPERTENSION 05/02/2007  ? ? ?[name], PTA ?06/28/2021, 3:13 PM ? ? ?[place] ?[address] ?[place], [place], [address] ?Phone: [phone]   Fax:  [phone] ? ?Name: Daniel [name] ?MRN: [id number] ?Date of Birth: 1955/07/07 ? ? ? ?

## 2021-06-30 ENCOUNTER — Other Ambulatory Visit: Payer: Self-pay

## 2021-06-30 ENCOUNTER — Ambulatory Visit: Payer: Medicare PPO | Admitting: Physical Therapy

## 2021-06-30 DIAGNOSIS — R262 Difficulty in walking, not elsewhere classified: Secondary | ICD-10-CM | POA: Diagnosis not present

## 2021-06-30 DIAGNOSIS — M6281 Muscle weakness (generalized): Secondary | ICD-10-CM

## 2021-06-30 NOTE — Therapy (Signed)
?[place] ?[address] ?[place], [place], [address] ?Phone: [phone]   Fax:  [phone] ? ?Physical Therapy Treatment ? ?Patient Details  ?Name: Daniel [name] ?MRN: [id number] ?Date of Birth: [birth year] ?Referring Provider (PT): Repass ? ? ?Encounter Date: 06/30/2021 ? ? PT End of Session - 06/30/21 1527   ? ? Visit Number 41   ? Date for PT Re-Evaluation 07/25/21   ? Authorization Type [employer]   ? PT Start Time 1435   ? PT Stop Time 1525   ? PT Time Calculation (min) 50 min   ? ?  ?  ? ?  ? ? ?Past Medical History:  ?Diagnosis Date  ? Arthritis   ? Diabetes mellitus without complication ([place])   ? Elevated PSA 08/29/2012  ? 7.22  ? Hyperlipidemia   ? Hypertension   ? Obstructive sleep apnea   ? CPAP  ? Prostate cancer [facility]) Feb 2014  ? Spinal stenosis   ? ? ?Past Surgical History:  ?Procedure Laterality Date  ? KNEE SURGERY    ? torn meniscus  ? PILONIDAL CYST EXCISION    ? lower spine age 12  ? PROSTATE BIOPSY Bilateral 08/29/2012  ? gleason 3+3=6  Dr.Marc Nesi  ? RADIOACTIVE SEED IMPLANT N/A 11/19/2012  ? Procedure: RADIOACTIVE SEED IMPLANT;  Surgeon: Hanley Ben, MD;  [facility];  Service: Urology;  Laterality: N/A;  ? SPINAL CORD DECOMPRESSION    ? Spinal injection  2012  ? spinal stenosis  ? TONSILLECTOMY    ? as child age 38 or 46  ? ? ?There were no vitals filed for this visit. ? ? Subjective Assessment - 06/30/21 1447   ? ? Subjective left leg stays tight esp when walking   ? ?  ?  ? ?  ? ? ? ? ? ? ? ? ? ? ? ? ? ? ? ? ? ? ? ? [facility] Adult PT Treatment/Exercise - 06/30/21 0001   ? ?  ? Ambulation/Gait  ? Gait Comments amb 75 feet circle without AD, no stopping, no LOB and no reaching for objects to steady CGA   2x  ?  ? Lumbar Exercises: Aerobic  ? Elliptical 30 strides + 2 Assistant   ? UBE (Upper Arm Bike) L 5 3 min fwd/3 min back   ? Nustep L 6 8 min   ?  ? Lumbar Exercises: Standing  ? Other Standing Lumbar Exercises blue tband row, shld ext and  bicep curl 2 sets 10   on airex with 3 # func cross over reaching  ? Other Standing Lumbar Exercises OHP blue ball 2x10- on airex   ?  ? Knee/Hip Exercises: Machines for Strengthening  ? Cybex Knee Extension 20lb 2x15   ? Cybex Knee Flexion 55lb  2x15   ?  ? Knee/Hip Exercises: Standing  ? Forward Step Up Both;5 reps   opp hip ext with walker 4 inch min A  ? Functional Squat 10 reps   ? ?  ?  ? ?  ? ? ? ? ? ? ? ? ? ? ? ? PT Short Term Goals - 05/10/21 1505   ? ?  ? PT SHORT TERM GOAL #1  ? Title give HEP that he will do at home   ? Status Achieved   ? ?  ?  ? ?  ? ? ? ? PT Long Term Goals - 06/28/21 1508   ? ?  ? PT LONG  TERM GOAL #1  ? Title independent with an advanced HEP   ? Status Achieved   ?  ? PT LONG TERM GOAL #2  ? Title decrease TUG time to 18 seconds   ? Status Partially Met   ?  ? PT LONG TERM GOAL #3  ? Title walk with a cane or lessed AD x 500 feet   ? Status Partially Met   ?  ? PT LONG TERM GOAL #4  ? Title get up from sitting without using hands   ? Status Achieved   ? ?  ?  ? ?  ? ? ? ? ? ? ? ? Plan - 06/30/21 1527   ? ? Clinical Impression Statement added elliptical today with + 2 assist to increase reciprocol gait. progressed gait and ex to facilitate upright posture- when he amb he tightens up thru spine and gluts which I believe if coming form fwd flexed trunk on walker most of the time. added dynamic balance outside midline which was very challenging for pt   ? PT Treatment/Interventions ADLs/Self Care Home Management;Gait training;Neuromuscular re-education;Balance training;Therapeutic exercise;Therapeutic activities;Functional mobility training;Stair training;Patient/family education;Manual techniques   ? PT Next Visit Plan continue to try to push his strength and function, challenge him dynamically   ? ?  ?  ? ?  ? ? ?Patient will benefit from skilled therapeutic intervention in order to improve the following deficits and impairments:  Abnormal gait, Decreased coordination, Decreased range  of motion, Difficulty walking, Decreased endurance, Cardiopulmonary status limiting activity, Decreased activity tolerance, Decreased balance, Improper body mechanics, Postural dysfunction, Decreased strength, Decreased mobility ? ?Visit Diagnosis: ?Difficulty in walking, not elsewhere classified ? ?Muscle weakness (generalized) ? ? ? ? ?Problem List ?Patient Active Problem List  ? Diagnosis Date Noted  ? Prostate cancer ([place]) 09/26/2012  ? DYSLIPIDEMIA 05/02/2007  ? MORBID OBESITY 05/02/2007  ? OBSTRUCTIVE SLEEP APNEA 05/02/2007  ? HYPERTENSION 05/02/2007  ? ? ?Nuno Brubacher,[name], PTA ?06/30/2021, 3:30 PM ? ?[place] ?[place] [address] ?[place], [place], [address] ?Phone: [phone]   Fax:  [phone] ? ?Name: Daniel [name] ?MRN: [id number] ?Date of Birth: [birth year] ? ? ? ?

## 2021-07-05 ENCOUNTER — Ambulatory Visit: Payer: Medicare PPO | Admitting: Physical Therapy

## 2021-07-05 ENCOUNTER — Other Ambulatory Visit: Payer: Self-pay

## 2021-07-05 ENCOUNTER — Encounter: Payer: Self-pay | Admitting: Physical Therapy

## 2021-07-05 DIAGNOSIS — R262 Difficulty in walking, not elsewhere classified: Secondary | ICD-10-CM | POA: Diagnosis not present

## 2021-07-05 DIAGNOSIS — M6281 Muscle weakness (generalized): Secondary | ICD-10-CM

## 2021-07-05 NOTE — Therapy (Signed)
[place] ?[place] ?[address] ?[place], [place], [address] ?Phone: [phone]   Fax:  [phone] ? ?Physical Therapy Treatment ? ?Patient Details  ?Name: Daniel [name] ?MRN: [id number] ?Date of Birth: 06/10/1955 ?Referring Provider (PT): Repass ? ? ?Encounter Date: 07/05/2021 ? ? PT End of Session - 07/05/21 1506   ? ? Visit Number 42   ? Date for PT Re-Evaluation 07/25/21   ? Authorization Type [employer]   ? PT Start Time 1415   ? PT Stop Time 1506   ? PT Time Calculation (min) 51 min   ? Activity Tolerance Patient tolerated treatment well   ? Behavior During Therapy [facility] for tasks assessed/performed   ? ?  ?  ? ?  ? ? ?Past Medical History:  ?Diagnosis Date  ? Arthritis   ? Diabetes mellitus without complication ([place])   ? Elevated PSA 08/29/2012  ? 7.22  ? Hyperlipidemia   ? Hypertension   ? Obstructive sleep apnea   ? CPAP  ? Prostate cancer [facility]) Feb 2014  ? Spinal stenosis   ? ? ?Past Surgical History:  ?Procedure Laterality Date  ? KNEE SURGERY    ? torn meniscus  ? PILONIDAL CYST EXCISION    ? lower spine age 84  ? PROSTATE BIOPSY Bilateral 08/29/2012  ? gleason 3+3=6  Dr.Marc Nesi  ? RADIOACTIVE SEED IMPLANT N/A 11/19/2012  ? Procedure: RADIOACTIVE SEED IMPLANT;  Surgeon: Hanley Ben, MD;  [facility];  Service: Urology;  Laterality: N/A;  ? SPINAL CORD DECOMPRESSION    ? Spinal injection  2012  ? spinal stenosis  ? TONSILLECTOMY    ? as child age 89 or 83  ? ? ?There were no vitals filed for this visit. ? ? Subjective Assessment - 07/05/21 1419   ? ? Subjective "Pretty good, some tenderness in L knee"   ? Currently in Pain? No/denies   ? ?  ?  ? ?  ? ? ? ? ? ? ? ? ? ? ? ? ? ? ? ? ? ? ? ? [place] Adult PT Treatment/Exercise - 07/05/21 0001   ? ?  ? Ambulation/Gait  ? Gait Comments amb 75 feet circle without AD, no stopping, no LOB and no reaching for objects to steady CGA   x2  ?  ? Lumbar Exercises: Stretches  ? Other Lumbar Stretch Exercise Trunk flex with blue  ball   ?  ? Lumbar Exercises: Aerobic  ? Nustep L 6 8 min   ?  ? Lumbar Exercises: Standing  ? Other Standing Lumbar Exercises blue tband row, shld ext and bicep curl 2 sets 10   ? Other Standing Lumbar Exercises OHP blue ball 2x10- on airex   ?  ? Lumbar Exercises: Seated  ? Sit to Stand 10 reps   x2 holding blue ball  ?  ? Knee/Hip Exercises: Stretches  ? Active Hamstring Stretch Both;2 reps;10 seconds   ?  ? Knee/Hip Exercises: Machines for Strengthening  ? Cybex Knee Extension 20lb 2x15   ? Cybex Knee Flexion 55lb  2x15   ? ?  ?  ? ?  ? ? ? ? ? ? ? ? ? ? ? ? PT Short Term Goals - 05/10/21 1505   ? ?  ? PT SHORT TERM GOAL #1  ? Title give HEP that he will do at home   ? Status Achieved   ? ?  ?  ? ?  ? ? ? ?  PT Long Term Goals - 06/28/21 1508   ? ?  ? PT LONG TERM GOAL #1  ? Title independent with an advanced HEP   ? Status Achieved   ?  ? PT LONG TERM GOAL #2  ? Title decrease TUG time to 18 seconds   ? Status Partially Met   ?  ? PT LONG TERM GOAL #3  ? Title walk with a cane or lessed AD x 500 feet   ? Status Partially Met   ?  ? PT LONG TERM GOAL #4  ? Title get up from sitting without using hands   ? Status Achieved   ? ?  ?  ? ?  ? ? ? ? ? ? ? ? Plan - 07/05/21 1506   ? ? Clinical Impression Statement Pt did well overall in today session. Continues with gait and balance on non compliant surfaces. Cues needed not to use tband to maintain stability while doing the interventions standing on airex. Cue needed at times to control gait speed for better balance.   ? Rehab Potential Good   ? PT Frequency 2x / week   ? PT Duration 12 weeks   ? PT Treatment/Interventions ADLs/Self Care Home Management;Gait training;Neuromuscular re-education;Balance training;Therapeutic exercise;Therapeutic activities;Functional mobility training;Stair training;Patient/family education;Manual techniques   ? PT Next Visit Plan continue to try to push his strength and function, challenge him dynamically   ? ?  ?  ? ?  ? ? ?Patient will  benefit from skilled therapeutic intervention in order to improve the following deficits and impairments:  Abnormal gait, Decreased coordination, Decreased range of motion, Difficulty walking, Decreased endurance, Cardiopulmonary status limiting activity, Decreased activity tolerance, Decreased balance, Improper body mechanics, Postural dysfunction, Decreased strength, Decreased mobility ? ?Visit Diagnosis: ?Muscle weakness (generalized) ? ?Difficulty in walking, not elsewhere classified ? ? ? ? ?Problem List ?Patient Active Problem List  ? Diagnosis Date Noted  ? Prostate cancer ([place]) 09/26/2012  ? DYSLIPIDEMIA 05/02/2007  ? MORBID OBESITY 05/02/2007  ? OBSTRUCTIVE SLEEP APNEA 05/02/2007  ? HYPERTENSION 05/02/2007  ? ? ?[name], PTA ?07/05/2021, 3:09 PM ? ?[place] ?[place] ?[address] ?[place], [place], [address] ?Phone: [phone]   Fax:  [phone] ? ?Name: Daniel [name] ?MRN: [id number] ?Date of Birth: 07/15/1955 ? ? ? ?

## 2021-07-07 ENCOUNTER — Ambulatory Visit: Payer: Medicare PPO | Admitting: Physical Therapy

## 2021-07-07 ENCOUNTER — Encounter: Payer: Self-pay | Admitting: Physical Therapy

## 2021-07-07 ENCOUNTER — Other Ambulatory Visit: Payer: Self-pay

## 2021-07-07 DIAGNOSIS — R262 Difficulty in walking, not elsewhere classified: Secondary | ICD-10-CM | POA: Diagnosis not present

## 2021-07-07 DIAGNOSIS — M6281 Muscle weakness (generalized): Secondary | ICD-10-CM | POA: Diagnosis not present

## 2021-07-07 NOTE — Therapy (Signed)
[place] ?[place] ?[address] ?[place], [place], [address] ?Phone: [phone]   Fax:  [phone] ? ?Physical Therapy Treatment ? ?Patient Details  ?Name: Daniel [name] ?MRN: [id number] ?Date of Birth: 1955/06/11 ?Referring Provider (PT): Repass ? ? ?Encounter Date: 07/07/2021 ? ? PT End of Session - 07/07/21 1507   ? ? Visit Number 74   ? Date for PT Re-Evaluation 07/25/21   ? PT Start Time 1415   ? PT Stop Time 5449   ? PT Time Calculation (min) 53 min   ? Activity Tolerance Patient tolerated treatment well   ? Behavior During Therapy [facility] for tasks assessed/performed   ? ?  ?  ? ?  ? ? ?Past Medical History:  ?Diagnosis Date  ? Arthritis   ? Diabetes mellitus without complication ([place])   ? Elevated PSA 08/29/2012  ? 7.22  ? Hyperlipidemia   ? Hypertension   ? Obstructive sleep apnea   ? CPAP  ? Prostate cancer [facility]) Feb 2014  ? Spinal stenosis   ? ? ?Past Surgical History:  ?Procedure Laterality Date  ? KNEE SURGERY    ? torn meniscus  ? PILONIDAL CYST EXCISION    ? lower spine age 75  ? PROSTATE BIOPSY Bilateral 08/29/2012  ? gleason 3+3=6  Dr.Marc Nesi  ? RADIOACTIVE SEED IMPLANT N/A 11/19/2012  ? Procedure: RADIOACTIVE SEED IMPLANT;  Surgeon: Hanley Ben, MD;  [facility];  Service: Urology;  Laterality: N/A;  ? SPINAL CORD DECOMPRESSION    ? Spinal injection  2012  ? spinal stenosis  ? TONSILLECTOMY    ? as child age 83 or 63  ? ? ?There were no vitals filed for this visit. ? ? Subjective Assessment - 07/07/21 1419   ? ? Subjective "Feeling good" Slight soreness I the knee   ? Currently in Pain? No/denies   ? ?  ?  ? ?  ? ? ? ? ? ? ? ? ? ? ? ? ? ? ? ? ? ? ? ? [place] Adult PT Treatment/Exercise - 07/07/21 0001   ? ?  ? Ambulation/Gait  ? Gait Comments amb 75 feet circle without AD, no stopping, no LOB and no reaching for objects to steady CGA   ?  ? High Level Balance  ? High Level Balance Activities Side stepping;Backward walking   ?  ? Lumbar Exercises: Aerobic   ? UBE (Upper Arm Bike) L 5 3 min fwd/3 min back   ? Nustep L 6 8 min   ?  ? Lumbar Exercises: Standing  ? Other Standing Lumbar Exercises black band rows and ext   ?  ? Lumbar Exercises: Seated  ? Sit to Stand 10 reps   x2 OHP blue ball  ?  ? Knee/Hip Exercises: Stretches  ? Active Hamstring Stretch Both;2 reps;10 seconds   ?  ? Knee/Hip Exercises: Machines for Strengthening  ? Cybex Knee Extension 20lb 2x15   ? Cybex Knee Flexion 55lb  2x15   ? ?  ?  ? ?  ? ? ? ? ? ? ? ? ? ? ? ? PT Short Term Goals - 05/10/21 1505   ? ?  ? PT SHORT TERM GOAL #1  ? Title give HEP that he will do at home   ? Status Achieved   ? ?  ?  ? ?  ? ? ? ? PT Long Term Goals - 07/07/21 1450   ? ?  ? PT  LONG TERM GOAL #1  ? Title independent with an advanced HEP   ? Status Achieved   ?  ? PT LONG TERM GOAL #2  ? Title decrease TUG time to 18 seconds   ? Status Partially Met   ?  ? PT LONG TERM GOAL #3  ? Title walk with a cane or lessed AD x 500 feet   ? Status Partially Met   ?  ? PT LONG TERM GOAL #4  ? Title get up from sitting without using hands   ? Status Achieved   ? ?  ?  ? ?  ? ? ? ? ? ? ? ? Plan - 07/07/21 1508   ? ? Clinical Impression Statement Cues required  to control gait speed when ambulating.  Increase fatigue reported with sit to stands. Cue to complete leg curls and ext with full ROM. Pt continues to do well in therapy and give good effort. No reports of increase knee pain during session.   ? Rehab Potential Good   ? PT Frequency 2x / week   ? PT Duration 12 weeks   ? PT Treatment/Interventions ADLs/Self Care Home Management;Gait training;Neuromuscular re-education;Balance training;Therapeutic exercise;Therapeutic activities;Functional mobility training;Stair training;Patient/family education;Manual techniques   ? PT Next Visit Plan continue to try to push his strength and function, challenge him dynamically   ? ?  ?  ? ?  ? ? ?Patient will benefit from skilled therapeutic intervention in order to improve the following  deficits and impairments:  Abnormal gait, Decreased coordination, Decreased range of motion, Difficulty walking, Decreased endurance, Cardiopulmonary status limiting activity, Decreased activity tolerance, Decreased balance, Improper body mechanics, Postural dysfunction, Decreased strength, Decreased mobility ? ?Visit Diagnosis: ?Muscle weakness (generalized) ? ?Difficulty in walking, not elsewhere classified ? ? ? ? ?Problem List ?Patient Active Problem List  ? Diagnosis Date Noted  ? Prostate cancer ([place]) 09/26/2012  ? DYSLIPIDEMIA 05/02/2007  ? MORBID OBESITY 05/02/2007  ? OBSTRUCTIVE SLEEP APNEA 05/02/2007  ? HYPERTENSION 05/02/2007  ? ? ?[name], PTA ?07/07/2021, 3:10 PM ? ?[place] ?[address] ?[address] ?[place], [place], [address] ?Phone: [phone]   Fax:  [phone] ? ?Name: Daniel [name] ?MRN: [id number] ?Date of Birth: 01-12-1956 ? ? ? ?

## 2021-07-12 ENCOUNTER — Other Ambulatory Visit: Payer: Self-pay

## 2021-07-12 ENCOUNTER — Ambulatory Visit: Payer: Medicare PPO | Admitting: Physical Therapy

## 2021-07-12 DIAGNOSIS — M6281 Muscle weakness (generalized): Secondary | ICD-10-CM

## 2021-07-12 DIAGNOSIS — R262 Difficulty in walking, not elsewhere classified: Secondary | ICD-10-CM

## 2021-07-12 NOTE — Therapy (Signed)
[place] ?[place] ?[address] ?[place], [place], [address] ?Phone: [phone]   Fax:  [phone] ? ?Physical Therapy Treatment ? ?Patient Details  ?Name: Daniel [name] ?MRN: [id number] ?Date of Birth: 12-23-1955 ?Referring Provider (PT): Repass ? ? ?Encounter Date: 07/12/2021 ? ? PT End of Session - 07/12/21 1527   ? ? Visit Number 44   ? Date for PT Re-Evaluation 07/25/21   ? Authorization Type Hum[employer]   ? PT Start Time 1430   ? PT Stop Time 1530   ? PT Time Calculation (min) 60 min   ? ?  ?  ? ?  ? ? ?Past Medical History:  ?Diagnosis Date  ? Arthritis   ? Diabetes mellitus without complication ([place])   ? Elevated PSA 08/29/2012  ? 7.22  ? Hyperlipidemia   ? Hypertension   ? Obstructive sleep apnea   ? CPAP  ? Prostate cancer [facility]) Feb 2014  ? Spinal stenosis   ? ? ?Past Surgical History:  ?Procedure Laterality Date  ? KNEE SURGERY    ? torn meniscus  ? PILONIDAL CYST EXCISION    ? lower spine age 13  ? PROSTATE BIOPSY Bilateral 08/29/2012  ? gleason 3+3=6  Dr.Marc Nesi  ? RADIOACTIVE SEED IMPLANT N/A 11/19/2012  ? Procedure: RADIOACTIVE SEED IMPLANT;  Surgeon: Hanley Ben, MD;  [facility];  Service: Urology;  Laterality: N/A;  ? SPINAL CORD DECOMPRESSION    ? Spinal injection  2012  ? spinal stenosis  ? TONSILLECTOMY    ? as child age 56 or 29  ? ? ?There were no vitals filed for this visit. ? ? Subjective Assessment - 07/12/21 1450   ? ? Subjective all is good, usual stuff   ? Currently in Pain? No/denies   ? ?  ?  ? ?  ? ? ? ? ? ? ? ? ? ? ? ? ? ? ? ? ? ? ? ? [place] Adult PT Treatment/Exercise - 07/12/21 0001   ? ?  ? Ambulation/Gait  ? Gait Comments amb 75 feet circle without AD, no stopping, no LOB and no reaching for objects to steady CGA. increased stride and arm swing   2 x, minimal c.o left knee pain  ?  ? Lumbar Exercises: Aerobic  ? UBE (Upper Arm Bike) L 5 3 min fwd/3 min back   ? Nustep L 6 8 min   ?  ? Knee/Hip Exercises: Aerobic  ? Other Aerobic L 2 2.5  min   ?  ? Knee/Hip Exercises: Machines for Strengthening  ? Cybex Knee Extension 20lb 2x15   ? Cybex Knee Flexion 55lb  2x15   ?  ? Knee/Hip Exercises: Standing  ? Walking with Sports Cord 30# RW 5 x fwd and back no AD CGA. then laterally with blue tband   ? ?  ?  ? ?  ? ? ? ? ? ? ? ? ? ? ? ? PT Short Term Goals - 05/10/21 1505   ? ?  ? PT SHORT TERM GOAL #1  ? Title give HEP that he will do at home   ? Status Achieved   ? ?  ?  ? ?  ? ? ? ? PT Long Term Goals - 07/07/21 1450   ? ?  ? PT LONG TERM GOAL #1  ? Title independent with an advanced HEP   ? Status Achieved   ?  ? PT LONG TERM GOAL #2  ?  Title decrease TUG time to 18 seconds   ? Status Partially Met   ?  ? PT LONG TERM GOAL #3  ? Title walk with a cane or lessed AD x 500 feet   ? Status Partially Met   ?  ? PT LONG TERM GOAL #4  ? Title get up from sitting without using hands   ? Status Achieved   ? ?  ?  ? ?  ? ? ? ? ? ? ? ? Plan - 07/12/21 1528   ? ? Clinical Impression Statement noted increased ease of gait without AD- smoother steps, les heavy showing more control and inceased arm swing. distance still limited to 75 feet before fatigue and some c/o left knee pain. resisted gait was very difficult for pt with balance. slow and steady progression being made   ? PT Treatment/Interventions ADLs/Self Care Home Management;Gait training;Neuromuscular re-education;Balance training;Therapeutic exercise;Therapeutic activities;Functional mobility training;Stair training;Patient/family education;Manual techniques   ? PT Next Visit Plan continue to try to push his strength and function, challenge him dynamically   ? ?  ?  ? ?  ? ? ?Patient will benefit from skilled therapeutic intervention in order to improve the following deficits and impairments:  Abnormal gait, Decreased coordination, Decreased range of motion, Difficulty walking, Decreased endurance, Cardiopulmonary status limiting activity, Decreased activity tolerance, Decreased balance, Improper body  mechanics, Postural dysfunction, Decreased strength, Decreased mobility ? ?Visit Diagnosis: ?Muscle weakness (generalized) ? ?Difficulty in walking, not elsewhere classified ? ? ? ? ?Problem List ?Patient Active Problem List  ? Diagnosis Date Noted  ? Prostate cancer ([place]) 09/26/2012  ? DYSLIPIDEMIA 05/02/2007  ? MORBID OBESITY 05/02/2007  ? OBSTRUCTIVE SLEEP APNEA 05/02/2007  ? HYPERTENSION 05/02/2007  ? ? ?Daniel [name],Daniel [name], Daniel [name] ?07/12/2021, 3:30 PM ? ?[place] ?[place] ?[address] ?[place], [place], [address] ?Phone: [phone]   Fax:  [phone] ? ?Name: Daniel [name] ?MRN: [id number] ?Date of Birth: 11-Jun-1955 ? ? ? ?

## 2021-07-14 ENCOUNTER — Other Ambulatory Visit: Payer: Self-pay

## 2021-07-14 ENCOUNTER — Ambulatory Visit: Payer: Medicare PPO | Admitting: Physical Therapy

## 2021-07-14 DIAGNOSIS — M6281 Muscle weakness (generalized): Secondary | ICD-10-CM

## 2021-07-14 DIAGNOSIS — R262 Difficulty in walking, not elsewhere classified: Secondary | ICD-10-CM | POA: Diagnosis not present

## 2021-07-14 NOTE — Therapy (Signed)
[place] ?[place] ?[address] ?[place], [place], [address] ?Phone: [phone]   Fax:  [phone] ? ?Physical Therapy Treatment ? ?Patient Details  ?Name: Daniel [name] ?MRN: [id number] ?Date of Birth: 1955-05-14 ?Referring Provider (PT): Repass ? ? ?Encounter Date: 07/14/2021 ? ? PT End of Session - 07/14/21 1521   ? ? Visit Number 25   ? Date for PT Re-Evaluation 07/25/21   ? Authorization Type Humana   ? PT Start Time 1430   ? PT Stop Time 1530   ? PT Time Calculation (min) 60 min   ? ?  ?  ? ?  ? ? ?Past Medical History:  ?Diagnosis Date  ? Arthritis   ? Diabetes mellitus without complication ([place])   ? Elevated PSA 08/29/2012  ? 7.22  ? Hyperlipidemia   ? Hypertension   ? Obstructive sleep apnea   ? CPAP  ? Prostate cancer [facility]) Feb 2014  ? Spinal stenosis   ? ? ?Past Surgical History:  ?Procedure Laterality Date  ? KNEE SURGERY    ? torn meniscus  ? PILONIDAL CYST EXCISION    ? lower spine age 74  ? PROSTATE BIOPSY Bilateral 08/29/2012  ? gleason 3+3=6  Dr.Marc Nesi  ? RADIOACTIVE SEED IMPLANT N/A 11/19/2012  ? Procedure: RADIOACTIVE SEED IMPLANT;  Surgeon: Hanley Ben, MD;  [facility];  Service: Urology;  Laterality: N/A;  ? SPINAL CORD DECOMPRESSION    ? Spinal injection  2012  ? spinal stenosis  ? TONSILLECTOMY    ? as child age 10 or 32  ? ? ?There were no vitals filed for this visit. ? ? Subjective Assessment - 07/14/21 1436   ? ? Subjective okay but definately had to recover after last session   ? Currently in Pain? Yes   ? Pain Score 2    ? Pain Location Knee   ? Pain Orientation Left   ? Pain Descriptors / Indicators Aching   ? ?  ?  ? ?  ? ? ? ? ? ? ? ? ? ? ? ? ? ? ? ? ? ? ? ? [place] Adult PT Treatment/Exercise - 07/14/21 0001   ? ?  ? Lumbar Exercises: Aerobic  ? Elliptical L 5  3 min   + 1 min A  ? UBE (Upper Arm Bike) L 5 3 min fwd/3 min back   ? Nustep L 6 8 min   ?  ? Lumbar Exercises: Standing  ? Other Standing Lumbar Exercises 6 inch step tap 10 x  each leg 1 HHA assit the alt 20 x   ?  ? Knee/Hip Exercises: Machines for Strengthening  ? Cybex Knee Extension 20lb 2x15   ? Cybex Knee Flexion 55lb  2x15   ?  ? Knee/Hip Exercises: Standing  ? Walking with Sports Cord 30# RW 5 x fwd and back no AD   ? Other Standing Knee Exercises cable pulleys shld ext and row 2 sets 10   ? Other Standing Knee Exercises ball slams 3 sets 3- min A   ? ?  ?  ? ?  ? ? ? ? ? ? ? ? ? ? ? ? PT Short Term Goals - 05/10/21 1505   ? ?  ? PT SHORT TERM GOAL #1  ? Title give HEP that he will do at home   ? Status Achieved   ? ?  ?  ? ?  ? ? ? ? PT Long  Term Goals - 07/14/21 1520   ? ?  ? PT LONG TERM GOAL #1  ? Title independent with an advanced HEP   ? Status Achieved   ?  ? PT LONG TERM GOAL #2  ? Title decrease TUG time to 18 seconds   ? Baseline 18 with RW   ? Status Achieved   ?  ? PT LONG TERM GOAL #3  ? Title walk with a cane or lessed AD x 500 feet   ? Status Partially Met   ?  ? PT LONG TERM GOAL #4  ? Title get up from sitting without using hands   ? Baseline varies on height and surface but more consistant   ? Status Partially Met   ? ?  ?  ? ?  ? ? ? ? ? ? ? ? Plan - 07/14/21 1521   ? ? Clinical Impression Statement increased ease getting on/of elliptical, added ball smals, alt step taps and resisted gat which all reall ychallenged balance. TUG goal met with RW and progresssing iwth ther LTGS. pt continues to show progress and continues to beenfit with skilled interentions   ? PT Treatment/Interventions ADLs/Self Care Home Management;Gait training;Neuromuscular re-education;Balance training;Therapeutic exercise;Therapeutic activities;Functional mobility training;Stair training;Patient/family education;Manual techniques   ? PT Next Visit Plan continue to try to push his strength and function, challenge him dynamically   ? ?  ?  ? ?  ? ? ?Patient will benefit from skilled therapeutic intervention in order to improve the following deficits and impairments:  Abnormal gait, Decreased  coordination, Decreased range of motion, Difficulty walking, Decreased endurance, Cardiopulmonary status limiting activity, Decreased activity tolerance, Decreased balance, Improper body mechanics, Postural dysfunction, Decreased strength, Decreased mobility ? ?Visit Diagnosis: ?Muscle weakness (generalized) ? ?Difficulty in walking, not elsewhere classified ? ? ? ? ?Problem List ?Patient Active Problem List  ? Diagnosis Date Noted  ? Prostate cancer ([place]) 09/26/2012  ? DYSLIPIDEMIA 05/02/2007  ? MORBID OBESITY 05/02/2007  ? OBSTRUCTIVE SLEEP APNEA 05/02/2007  ? HYPERTENSION 05/02/2007  ? ? ?Correen Bubolz,[name], PTA ?07/14/2021, 3:24 PM ? ?[place] ?[place] ?[address] ?[place], [place], [address] ?Phone: [phone]   Fax:  [phone] ? ?Name: Daniel [name] ?MRN: [id number] ?Date of Birth: 24-Jun-1955 ? ? ? ?

## 2021-07-19 ENCOUNTER — Ambulatory Visit: Payer: Medicare PPO | Admitting: Physical Therapy

## 2021-07-19 ENCOUNTER — Other Ambulatory Visit: Payer: Self-pay

## 2021-07-19 ENCOUNTER — Encounter: Payer: Self-pay | Admitting: Physical Therapy

## 2021-07-19 DIAGNOSIS — M6281 Muscle weakness (generalized): Secondary | ICD-10-CM | POA: Diagnosis not present

## 2021-07-19 DIAGNOSIS — R262 Difficulty in walking, not elsewhere classified: Secondary | ICD-10-CM

## 2021-07-19 NOTE — Therapy (Signed)
[place] ?[place] ?[address] ?[place], [place], [address] ?Phone: [phone]   Fax:  [phone] ? ?Physical Therapy Treatment ? ?Patient Details  ?Name: Daniel [name] ?MRN: [id number] ?Date of Birth: 12-05-1955 ?Referring Provider (PT): Repass ? ? ?Encounter Date: 07/19/2021 ? ? PT End of Session - 07/19/21 1507   ? ? Visit Number 30   ? Date for PT Re-Evaluation 07/25/21   ? Authorization Type Humana   ? PT Start Time 1415   ? PT Stop Time 1165   ? PT Time Calculation (min) 54 min   ? Activity Tolerance Patient tolerated treatment well   ? Behavior During Therapy [facility] for tasks assessed/performed   ? ?  ?  ? ?  ? ? ?Past Medical History:  ?Diagnosis Date  ? Arthritis   ? Diabetes mellitus without complication ([place])   ? Elevated PSA 08/29/2012  ? 7.22  ? Hyperlipidemia   ? Hypertension   ? Obstructive sleep apnea   ? CPAP  ? Prostate cancer [facility]) Feb 2014  ? Spinal stenosis   ? ? ?Past Surgical History:  ?Procedure Laterality Date  ? KNEE SURGERY    ? torn meniscus  ? PILONIDAL CYST EXCISION    ? lower spine age 26  ? PROSTATE BIOPSY Bilateral 08/29/2012  ? gleason 3+3=6  Dr.Marc Nesi  ? RADIOACTIVE SEED IMPLANT N/A 11/19/2012  ? Procedure: RADIOACTIVE SEED IMPLANT;  Surgeon: Hanley Ben, MD;  [facility];  Service: Urology;  Laterality: N/A;  ? SPINAL CORD DECOMPRESSION    ? Spinal injection  2012  ? spinal stenosis  ? TONSILLECTOMY    ? as child age 59 or 52  ? ? ?There were no vitals filed for this visit. ? ? Subjective Assessment - 07/19/21 1425   ? ? Subjective Pretty good legs are a little tight   ? Currently in Pain? No/denies   ? ?  ?  ? ?  ? ? ? ? ? ? ? ? ? ? ? ? ? ? ? ? ? ? ? ? [place] Adult PT Treatment/Exercise - 07/19/21 0001   ? ?  ? Ambulation/Gait  ? Gait Comments amb 75 feet circle without AD, no stopping, no LOB and no reaching for objects to steady CGA. increased stride and arm swing   x3  ?  ? Lumbar Exercises: Stretches  ? Other Lumbar Stretch  Exercise Trunk flex with blue ball   ?  ? Lumbar Exercises: Aerobic  ? Elliptical L 3  3 min   min assit to get on and off. Post session  ? UBE (Upper Arm Bike) L 5 3 min fwd/3 min back   ? Nustep L 6 8 min   ?  ? Knee/Hip Exercises: Machines for Strengthening  ? Cybex Knee Extension 20lb 2x15   ? Cybex Knee Flexion 55lb  2x15   ? ?  ?  ? ?  ? ? ? ? ? ? ? ? ? ? ? ? PT Short Term Goals - 05/10/21 1505   ? ?  ? PT SHORT TERM GOAL #1  ? Title give HEP that he will do at home   ? Status Achieved   ? ?  ?  ? ?  ? ? ? ? PT Long Term Goals - 07/14/21 1520   ? ?  ? PT LONG TERM GOAL #1  ? Title independent with an advanced HEP   ? Status Achieved   ?  ?  PT LONG TERM GOAL #2  ? Title decrease TUG time to 18 seconds   ? Baseline 18 with RW   ? Status Achieved   ?  ? PT LONG TERM GOAL #3  ? Title walk with a cane or lessed AD x 500 feet   ? Status Partially Met   ?  ? PT LONG TERM GOAL #4  ? Title get up from sitting without using hands   ? Baseline varies on height and surface but more consistant   ? Status Partially Met   ? ?  ?  ? ?  ? ? ? ? ? ? ? ? Plan - 07/19/21 1509   ? ? Clinical Impression Statement Pt enters clinic feeling fine ambulating with RW and forward flexed posture. Pt was ambulating unassisted prior to surgery. Pt continues to progress towards all LTG. He has progress to ambulation without AD under CGA from therapist showing improved upright posture. Pt was able to tolerate an additional gait trial today. Gait at times can be unsteady due to decrease balance. Functional weakness remains in both LE evident by the need of momentum with sit to stands.  Pt at times doe waver with standing balance with decrease ankle strategies. Pt would continue to benefit form stilled PT serviced to gait  functional I.   ? Rehab Potential Good   ? PT Frequency 2x / week   ? PT Duration 12 weeks   ? PT Treatment/Interventions ADLs/Self Care Home Management;Gait training;Neuromuscular re-education;Balance training;Therapeutic  exercise;Therapeutic activities;Functional mobility training;Stair training;Patient/family education;Manual techniques   ? PT Next Visit Plan continue to try to push his strength and function, challenge him dynamically   ? ?  ?  ? ?  ? ? ?Patient will benefit from skilled therapeutic intervention in order to improve the following deficits and impairments:  Abnormal gait, Decreased coordination, Decreased range of motion, Difficulty walking, Decreased endurance, Cardiopulmonary status limiting activity, Decreased activity tolerance, Decreased balance, Improper body mechanics, Postural dysfunction, Decreased strength, Decreased mobility ? ?Visit Diagnosis: ?Muscle weakness (generalized) ? ?Difficulty in walking, not elsewhere classified ? ? ? ? ?Problem List ?Patient Active Problem List  ? Diagnosis Date Noted  ? Prostate cancer ([place]) 09/26/2012  ? DYSLIPIDEMIA 05/02/2007  ? MORBID OBESITY 05/02/2007  ? OBSTRUCTIVE SLEEP APNEA 05/02/2007  ? HYPERTENSION 05/02/2007  ? ? ?[name], PTA ?07/19/2021, 3:10 PM ? ?[place] ?[place] ?[address] ?[place], [place], [address] ?Phone: [phone]   Fax:  [phone] ? ?Name: Daniel [name] ?MRN: [id number] ?Date of Birth: May 03, 1955 ? ? ? ?

## 2021-07-21 ENCOUNTER — Ambulatory Visit: Payer: Medicare PPO | Admitting: Physical Therapy

## 2021-07-21 DIAGNOSIS — R262 Difficulty in walking, not elsewhere classified: Secondary | ICD-10-CM | POA: Diagnosis not present

## 2021-07-21 DIAGNOSIS — M6281 Muscle weakness (generalized): Secondary | ICD-10-CM

## 2021-07-21 NOTE — Therapy (Signed)
[place] ?[place] ?[address] ?[place], [place], [address] ?Phone: [phone]   Fax:  [phone] ? ?Physical Therapy Treatment ? ?Patient Details  ?Name: Daniel [name] ?MRN: [id number] ?Date of Birth: 02/07/1956 ?Referring Provider (PT): Repass ? ? ?Encounter Date: 07/21/2021 ? ? PT End of Session - 07/21/21 1512   ? ? Visit Number 37   ? Number of Visits 13   ? Date for PT Re-Evaluation 07/25/21   ? Authorization Type Humana   ? PT Start Time 1430   ? PT Stop Time 1530   ? PT Time Calculation (min) 60 min   ? ?  ?  ? ?  ? ? ?Past Medical History:  ?Diagnosis Date  ? Arthritis   ? Diabetes mellitus without complication ([place])   ? Elevated PSA 08/29/2012  ? 7.22  ? Hyperlipidemia   ? Hypertension   ? Obstructive sleep apnea   ? CPAP  ? Prostate cancer [facility]) Feb 2014  ? Spinal stenosis   ? ? ?Past Surgical History:  ?Procedure Laterality Date  ? KNEE SURGERY    ? torn meniscus  ? PILONIDAL CYST EXCISION    ? lower spine age 70  ? PROSTATE BIOPSY Bilateral 08/29/2012  ? gleason 3+3=6  Dr.Marc Nesi  ? RADIOACTIVE SEED IMPLANT N/A 11/19/2012  ? Procedure: RADIOACTIVE SEED IMPLANT;  Surgeon: Hanley Ben, MD;  [facility];  Service: Urology;  Laterality: N/A;  ? SPINAL CORD DECOMPRESSION    ? Spinal injection  2012  ? spinal stenosis  ? TONSILLECTOMY    ? as child age 17 or 41  ? ? ?There were no vitals filed for this visit. ? ? Subjective Assessment - 07/21/21 1434   ? ? Subjective doing good   ? Currently in Pain? No/denies   ? ?  ?  ? ?  ? ? ? ? ? ? ? ? ? ? ? ? ? ? ? ? ? ? ? ? [place] Adult PT Treatment/Exercise - 07/21/21 0001   ? ?  ? Ambulation/Gait  ? Gait Comments amb 2 circles 150 feet  without AD, no stopping, no LOB and no reaching for objects to steady CGA. increased stride and arm swing   some LB fatigue/tightness. after seated rest repeated- increased sway and listing RT but no LOB  ?  ? High Level Balance  ? High Level Balance Activities --   agility ladder with  RW then HHA- working in increased stride and decreased fwd lean on walker  ?  ? Lumbar Exercises: Aerobic  ? UBE (Upper Arm Bike) L 5 3 min fwd/3 min back   ?  ? Lumbar Exercises: Standing  ? Other Standing Lumbar Exercises blue tband trunk rotation, row and ext 2 sets 15   ? Other Standing Lumbar Exercises STS with ball toss with initial stand 10x- multi LOB   ?  ? Knee/Hip Exercises: Machines for Strengthening  ? Cybex Knee Extension 35# 3 sets 10   ? Cybex Knee Flexion 65# 3 sets 10   ? ?  ?  ? ?  ? ? ? ? ? ? ? ? ? ? ? ? PT Short Term Goals - 05/10/21 1505   ? ?  ? PT SHORT TERM GOAL #1  ? Title give HEP that he will do at home   ? Status Achieved   ? ?  ?  ? ?  ? ? ? ? PT Long Term Goals - 07/14/21  1520   ? ?  ? PT LONG TERM GOAL #1  ? Title independent with an advanced HEP   ? Status Achieved   ?  ? PT LONG TERM GOAL #2  ? Title decrease TUG time to 18 seconds   ? Baseline 18 with RW   ? Status Achieved   ?  ? PT LONG TERM GOAL #3  ? Title walk with a cane or lessed AD x 500 feet   ? Status Partially Met   ?  ? PT LONG TERM GOAL #4  ? Title get up from sitting without using hands   ? Baseline varies on height and surface but more consistant   ? Status Partially Met   ? ?  ?  ? ?  ? ? ? ? ? ? ? ? Plan - 07/21/21 1510   ? ? Clinical Impression Statement pt increased gait without AD from 75 feet 3 x with rest to 150 2 x with rest CG, fatigued noted secend walk. worked on increased upright posture and less fwd wt bearing on UE.   ? PT Treatment/Interventions ADLs/Self Care Home Management;Gait training;Neuromuscular re-education;Balance training;Therapeutic exercise;Therapeutic activities;Functional mobility training;Stair training;Patient/family education;Manual techniques   ? PT Next Visit Plan continue to try to push his strength and function, challenge him dynamically   ? ?  ?  ? ?  ? ? ?Patient will benefit from skilled therapeutic intervention in order to improve the following deficits and impairments:   Abnormal gait, Decreased coordination, Decreased range of motion, Difficulty walking, Decreased endurance, Cardiopulmonary status limiting activity, Decreased activity tolerance, Decreased balance, Improper body mechanics, Postural dysfunction, Decreased strength, Decreased mobility ? ?Visit Diagnosis: ?Muscle weakness (generalized) ? ?Difficulty in walking, not elsewhere classified ? ? ? ? ?Problem List ?Patient Active Problem List  ? Diagnosis Date Noted  ? Prostate cancer ([place]) 09/26/2012  ? DYSLIPIDEMIA 05/02/2007  ? MORBID OBESITY 05/02/2007  ? OBSTRUCTIVE SLEEP APNEA 05/02/2007  ? HYPERTENSION 05/02/2007  ? ? ?Janet Decesare,[name], PTA ?07/21/2021, 3:13 PM ? ?[place] ?[place] ?[address] ?[place], [place], [address] ?Phone: [phone]   Fax:  [phone] ? ?Name: Daniel [name] ?MRN: [id number] ?Date of Birth: 11/03/1955 ? ? ? ?

## 2021-07-26 ENCOUNTER — Encounter: Payer: Self-pay | Admitting: Physical Therapy

## 2021-07-26 ENCOUNTER — Ambulatory Visit: Payer: Medicare PPO | Attending: *Deleted | Admitting: Physical Therapy

## 2021-07-26 DIAGNOSIS — R262 Difficulty in walking, not elsewhere classified: Secondary | ICD-10-CM | POA: Diagnosis not present

## 2021-07-26 DIAGNOSIS — M6281 Muscle weakness (generalized): Secondary | ICD-10-CM | POA: Insufficient documentation

## 2021-07-26 NOTE — Therapy (Signed)
[place] ?[place] ?[address] ?[place], [place], [address] ?Phone: [phone]   Fax:  [phone] ? ?Physical Therapy Treatment ? ?Patient Details  ?Name: Daniel [name] ?MRN: [id number] ?Date of Birth: 1955/12/23 ?Referring Provider (PT): Repass ? ? ?Encounter Date: 07/26/2021 ? ? PT End of Session - 07/26/21 1507   ? ? Visit Number 48   ? Authorization Type Humana   ? PT Start Time 1415   ? PT Stop Time 1513   ? PT Time Calculation (min) 58 min   ? Activity Tolerance Patient tolerated treatment well   ? Behavior During Therapy [facility] for tasks assessed/performed   ? ?  ?  ? ?  ? ? ?Past Medical History:  ?Diagnosis Date  ? Arthritis   ? Diabetes mellitus without complication ([place])   ? Elevated PSA 08/29/2012  ? 7.22  ? Hyperlipidemia   ? Hypertension   ? Obstructive sleep apnea   ? CPAP  ? Prostate cancer [facility]) Feb 2014  ? Spinal stenosis   ? ? ?Past Surgical History:  ?Procedure Laterality Date  ? KNEE SURGERY    ? torn meniscus  ? PILONIDAL CYST EXCISION    ? lower spine age 61  ? PROSTATE BIOPSY Bilateral 08/29/2012  ? gleason 3+3=6  Dr.Marc Nesi  ? RADIOACTIVE SEED IMPLANT N/A 11/19/2012  ? Procedure: RADIOACTIVE SEED IMPLANT;  Surgeon: Hanley Ben, MD;  [facility];  Service: Urology;  Laterality: N/A;  ? SPINAL CORD DECOMPRESSION    ? Spinal injection  2012  ? spinal stenosis  ? TONSILLECTOMY    ? as child age 66 or 19  ? ? ?There were no vitals filed for this visit. ? ? Subjective Assessment - 07/26/21 1429   ? ? Subjective "Feeling good"   ? Currently in Pain? No/denies   ? ?  ?  ? ?  ? ? ? ? ? ? ? ? ? ? ? ? ? ? ? ? ? ? ? ? [place] Adult PT Treatment/Exercise - 07/26/21 0001   ? ?  ? Ambulation/Gait  ? Gait Comments amb 2 circles 150 feet  without AD, no stopping, no LOB and no reaching for objects to steady CGA. increased stride and arm swing   some fatigue and low back tightness  ?  ? Lumbar Exercises: Aerobic  ? Elliptical L 3  3 min   ? UBE (Upper Arm Bike) L 5  3 min fwd/3 min back   ? Nustep L 6 8 min   ?  ? Lumbar Exercises: Standing  ? Other Standing Lumbar Exercises blue tband AR press , row and ext 2 sets 15   ? Other Standing Lumbar Exercises STS with ball toss with blue ball   ? ?  ?  ? ?  ? ? ? ? ? ? ? ? ? ? ? ? PT Short Term Goals - 05/10/21 1505   ? ?  ? PT SHORT TERM GOAL #1  ? Title give HEP that he will do at home   ? Status Achieved   ? ?  ?  ? ?  ? ? ? ? PT Long Term Goals - 07/14/21 1520   ? ?  ? PT LONG TERM GOAL #1  ? Title independent with an advanced HEP   ? Status Achieved   ?  ? PT LONG TERM GOAL #2  ? Title decrease TUG time to 18 seconds   ? Baseline 18 with RW   ?  Status Achieved   ?  ? PT LONG TERM GOAL #3  ? Title walk with a cane or lessed AD x 500 feet   ? Status Partially Met   ?  ? PT LONG TERM GOAL #4  ? Title get up from sitting without using hands   ? Baseline varies on height and surface but more consistant   ? Status Partially Met   ? ?  ?  ? ?  ? ? ? ? ? ? ? ? Plan - 07/26/21 1508   ? ? Clinical Impression Statement Good carryover form last session with gait. Pt able to ambulate 200 ft twice with rest. Increase fatigue and low back tightness reported with ambulation. Good effort on elliptical but it was very taxing on PT.   ? Rehab Potential Good   ? PT Frequency 2x / week   ? PT Duration 12 weeks   ? PT Treatment/Interventions ADLs/Self Care Home Management;Gait training;Neuromuscular re-education;Balance training;Therapeutic exercise;Therapeutic activities;Functional mobility training;Stair training;Patient/family education;Manual techniques   ? PT Next Visit Plan continue to try to push his strength and function, challenge him dynamically   ? ?  ?  ? ?  ? ? ?Patient will benefit from skilled therapeutic intervention in order to improve the following deficits and impairments:  Abnormal gait, Decreased coordination, Decreased range of motion, Difficulty walking, Decreased endurance, Cardiopulmonary status limiting activity, Decreased  activity tolerance, Decreased balance, Improper body mechanics, Postural dysfunction, Decreased strength, Decreased mobility ? ?Visit Diagnosis: ?Difficulty in walking, not elsewhere classified ? ?Muscle weakness (generalized) ? ? ? ? ?Problem List ?Patient Active Problem List  ? Diagnosis Date Noted  ? Prostate cancer ([place]) 09/26/2012  ? DYSLIPIDEMIA 05/02/2007  ? MORBID OBESITY 05/02/2007  ? OBSTRUCTIVE SLEEP APNEA 05/02/2007  ? HYPERTENSION 05/02/2007  ? ? ?[name], PTA ?07/26/2021, 3:14 PM ? ?[place] ?[place] ?[address] ?[place], [place], [address] ?Phone: [phone]   Fax:  [phone] ? ?Name: Daniel [name] ?MRN: [id number] ?Date of Birth: [birth year] ? ? ? ?

## 2021-07-28 ENCOUNTER — Ambulatory Visit: Payer: Medicare PPO | Admitting: Physical Therapy

## 2021-07-28 DIAGNOSIS — R262 Difficulty in walking, not elsewhere classified: Secondary | ICD-10-CM | POA: Diagnosis not present

## 2021-07-28 DIAGNOSIS — M6281 Muscle weakness (generalized): Secondary | ICD-10-CM | POA: Diagnosis not present

## 2021-07-28 NOTE — Therapy (Signed)
[facility] ?[place] ?[address] ?[place], [place], [address] ?Phone: [phone]   Fax:  [phone] ? ?Physical Therapy Treatment ? ?Patient Details  ?Name: Daniel [name] ?MRN: [id number] ?Date of Birth: [birth year] ?Referring Provider (PT): Repass ? ? ?Encounter Date: 07/28/2021 ? ? PT End of Session - 07/28/21 1530   ? ? Visit Number 51   ? Date for PT Re-Evaluation 07/25/21   ? Authorization Type Humana   ? PT Start Time 1430   ? PT Stop Time 1530   ? PT Time Calculation (min) 60 min   ? ?  ?  ? ?  ? ? ?Past Medical History:  ?Diagnosis Date  ? Arthritis   ? Diabetes mellitus without complication ([place])   ? Elevated PSA 08/29/2012  ? 7.22  ? Hyperlipidemia   ? Hypertension   ? Obstructive sleep apnea   ? CPAP  ? Prostate cancer [facility]) Feb 2014  ? Spinal stenosis   ? ? ?Past Surgical History:  ?Procedure Laterality Date  ? KNEE SURGERY    ? torn meniscus  ? PILONIDAL CYST EXCISION    ? lower spine age 43  ? PROSTATE BIOPSY Bilateral 08/29/2012  ? gleason 3+3=6  Dr.Marc Nesi  ? RADIOACTIVE SEED IMPLANT N/A 11/19/2012  ? Procedure: RADIOACTIVE SEED IMPLANT;  Surgeon: Hanley Ben, MD;  [facility];  Service: Urology;  Laterality: N/A;  ? SPINAL CORD DECOMPRESSION    ? Spinal injection  2012  ? spinal stenosis  ? TONSILLECTOMY    ? as child age 52 or 32  ? ? ?There were no vitals filed for this visit. ? ? Subjective Assessment - 07/28/21 1439   ? ? Subjective did not sleep well last night, alittle stiff   ? Currently in Pain? Yes   ? Pain Score 2    ? Pain Location Knee   ? ?  ?  ? ?  ? ? ? ? ? ? ? ? ? ? ? ? ? ? ? ? ? ? ? ? [place] Adult PT Treatment/Exercise - 07/28/21 0001   ? ?  ? Lumbar Exercises: Aerobic  ? UBE (Upper Arm Bike) L 5 3 min fwd/3 min back   ? Nustep L 6 8 min   ?  ? Lumbar Exercises: Standing  ? Other Standing Lumbar Exercises blue tband AR press , row and ext 2 sets 15   ? Other Standing Lumbar Exercises STS with ball toss with blue ball   ?  ? Knee/Hip  Exercises: Machines for Strengthening  ? Cybex Knee Extension 35# 3 sets 10   ? Cybex Knee Flexion 65# 3 sets 10   ? Cybex Leg Press 100# 1 set 15, 120 # 15 x   ?  ? Knee/Hip Exercises: Standing  ? Forward Step Up Both   2 x each with increased shooting LE pain so stopped  ? ?  ?  ? ?  ? ? ? ? ? ? ? ? ? ? PT Education - 07/28/21 1529   ? ? Education Details educ on LB stretching variations   ? Person(s) Educated Patient   ? Methods Explanation;Demonstration   ? Comprehension Verbalized understanding;Returned demonstration   ? ?  ?  ? ?  ? ? ? PT Short Term Goals - 05/10/21 1505   ? ?  ? PT SHORT TERM GOAL #1  ? Title give HEP that he will do at home   ? Status Achieved   ? ?  ?  ? ?  ? ? ? ?  PT Long Term Goals - 07/28/21 1514   ? ?  ? PT LONG TERM GOAL #1  ? Title independent with an advanced HEP   ? Status Achieved   ?  ? PT LONG TERM GOAL #2  ? Title decrease TUG time to 18 seconds   ? Status Achieved   ?  ? PT LONG TERM GOAL #3  ? Title walk with a cane or lessed AD x 500 feet   ? Status Partially Met   ?  ? PT LONG TERM GOAL #4  ? Title get up from sitting without using hands   ? Baseline varies on height and surface but more consistant   ? Status Partially Met   ? ?  ?  ? ?  ? ? ? ? ? ? ? ? Plan - 07/28/21 1531   ? ? Clinical Impression Statement pt struggling with muscles spasms LB and HS with unassisted gait and step ups today. focus on back strengthening and machine interventions   ? PT Treatment/Interventions ADLs/Self Care Home Management;Gait training;Neuromuscular re-education;Balance training;Therapeutic exercise;Therapeutic activities;Functional mobility training;Stair training;Patient/family education;Manual techniques   ? PT Next Visit Plan continue to try to push his strength and function, challenge him dynamically   ? ?  ?  ? ?  ? ? ?Patient will benefit from skilled therapeutic intervention in order to improve the following deficits and impairments:  Abnormal gait, Decreased coordination,  Decreased range of motion, Difficulty walking, Decreased endurance, Cardiopulmonary status limiting activity, Decreased activity tolerance, Decreased balance, Improper body mechanics, Postural dysfunction, Decreased strength, Decreased mobility ? ?Visit Diagnosis: ?Difficulty in walking, not elsewhere classified ? ?Muscle weakness (generalized) ? ? ? ? ?Problem List ?Patient Active Problem List  ? Diagnosis Date Noted  ? Prostate cancer ([place]) 09/26/2012  ? DYSLIPIDEMIA 05/02/2007  ? MORBID OBESITY 05/02/2007  ? OBSTRUCTIVE SLEEP APNEA 05/02/2007  ? HYPERTENSION 05/02/2007  ? ? ?Terrin Meddaugh,[name], PTA ?07/28/2021, 3:33 PM ? ?[facility] ?[place] ?[address] ?[place], [place], [address] ?Phone: [phone]   Fax:  [phone] ? ?Name: Montgomery Favor ?MRN: [id number] ?Date of Birth: 1956/01/11 ? ? ? ?

## 2021-08-02 ENCOUNTER — Ambulatory Visit: Payer: Medicare PPO | Admitting: Physical Therapy

## 2021-08-02 DIAGNOSIS — R262 Difficulty in walking, not elsewhere classified: Secondary | ICD-10-CM | POA: Diagnosis not present

## 2021-08-02 DIAGNOSIS — M6281 Muscle weakness (generalized): Secondary | ICD-10-CM | POA: Diagnosis not present

## 2021-08-02 NOTE — Therapy (Signed)
[facility] ?[place] ?[address] ?[place], [place], [address] ?Phone: [phone]   Fax:  [phone] ? ?Physical Therapy Treatment ? ?Patient Details  ?Name: Daniel [name] ?MRN: [id number] ?Date of Birth: 10-Jan-1956 ?Referring Provider (PT): Repass ? ? ?Encounter Date: 08/02/2021 ? ? PT End of Session - 08/02/21 1358   ? ? Visit Number 50   ? Number of Visits 13   ? Date for PT Re-Evaluation 07/25/21   ? Authorization Type Humana   ? PT Start Time 1301   ? PT Stop Time 1400   ? PT Time Calculation (min) 59 min   ? ?  ?  ? ?  ? ? ?Past Medical History:  ?Diagnosis Date  ? Arthritis   ? Diabetes mellitus without complication ([place])   ? Elevated PSA 08/29/2012  ? 7.22  ? Hyperlipidemia   ? Hypertension   ? Obstructive sleep apnea   ? CPAP  ? Prostate cancer [facility]) Feb 2014  ? Spinal stenosis   ? ? ?Past Surgical History:  ?Procedure Laterality Date  ? KNEE SURGERY    ? torn meniscus  ? PILONIDAL CYST EXCISION    ? lower spine age 32  ? PROSTATE BIOPSY Bilateral 08/29/2012  ? gleason 3+3=6  Dr.Marc Nesi  ? RADIOACTIVE SEED IMPLANT N/A 11/19/2012  ? Procedure: RADIOACTIVE SEED IMPLANT;  Surgeon: Hanley Ben, MD;  [facility];  Service: Urology;  Laterality: N/A;  ? SPINAL CORD DECOMPRESSION    ? Spinal injection  2012  ? spinal stenosis  ? TONSILLECTOMY    ? as child age 34 or 30  ? ? ?There were no vitals filed for this visit. ? ? Subjective Assessment - 08/02/21 1259   ? ? Subjective doing pretty good   ? Currently in Pain? Yes   ? Pain Score 2    ? Pain Location Knee   ? Pain Orientation Left   ? ?  ?  ? ?  ? ? ? ? ? ? ? ? ? ? ? ? ? ? ? ? ? ? ? ? [place] Adult PT Treatment/Exercise - 08/02/21 0001   ? ?  ? Ambulation/Gait  ? Gait Comments amb 100 feet without AD CGA good stride and speed but gluts cramp   ?  ? High Level Balance  ? High Level Balance Activities Negotiating over obstacles   mod A  ?  ? Lumbar Exercises: Aerobic  ? Elliptical L 3 3 min   ? UBE (Upper Arm Bike) L  5 3 min fwd/3 min back   ? Nustep L 6 8 min   ?  ? Lumbar Exercises: Standing  ? Other Standing Lumbar Exercises cable AR press , row and ext 2 sets 15   ? Other Standing Lumbar Exercises resisted gait 5 x fwd and 3 x each side 30 #   ?  ? Lumbar Exercises: Seated  ? Sit to Stand 10 reps   standard chair without UE. 1 LOB and 2 fast lowerings  ? ?  ?  ? ?  ? ? ? ? ? ? ? ? ? ? ? ? PT Short Term Goals - 05/10/21 1505   ? ?  ? PT SHORT TERM GOAL #1  ? Title give HEP that he will do at home   ? Status Achieved   ? ?  ?  ? ?  ? ? ? ? PT Long Term Goals - 07/28/21 1514   ? ?  ?  PT LONG TERM GOAL #1  ? Title independent with an advanced HEP   ? Status Achieved   ?  ? PT LONG TERM GOAL #2  ? Title decrease TUG time to 18 seconds   ? Status Achieved   ?  ? PT LONG TERM GOAL #3  ? Title walk with a cane or lessed AD x 500 feet   ? Status Partially Met   ?  ? PT LONG TERM GOAL #4  ? Title get up from sitting without using hands   ? Baseline varies on height and surface but more consistant   ? Status Partially Met   ? ?  ?  ? ?  ? ? ? ? ? ? ? ? Plan - 08/02/21 1358   ? ? Clinical Impression Statement progressed standing func ex with CGA. progressed STS form standard chair with 1 LOB and 2 quick eccentric lowering. worked on Research scientist (physical sciences) with mod A   ? PT Treatment/Interventions ADLs/Self Care Home Management;Gait training;Neuromuscular re-education;Balance training;Therapeutic exercise;Therapeutic activities;Functional mobility training;Stair training;Patient/family education;Manual techniques   ? PT Next Visit Plan continue to try to push his strength and function, challenge him dynamically   ? ?  ?  ? ?  ? ? ?Patient will benefit from skilled therapeutic intervention in order to improve the following deficits and impairments:  Abnormal gait, Decreased coordination, Decreased range of motion, Difficulty walking, Decreased endurance, Cardiopulmonary status limiting activity, Decreased activity tolerance, Decreased  balance, Improper body mechanics, Postural dysfunction, Decreased strength, Decreased mobility ? ?Visit Diagnosis: ?Difficulty in walking, not elsewhere classified ? ?Muscle weakness (generalized) ? ? ? ? ?Problem List ?Patient Active Problem List  ? Diagnosis Date Noted  ? Prostate cancer ([place]) 09/26/2012  ? DYSLIPIDEMIA 05/02/2007  ? MORBID OBESITY 05/02/2007  ? OBSTRUCTIVE SLEEP APNEA 05/02/2007  ? HYPERTENSION 05/02/2007  ? ? ?Gari Trovato,[name], PTA ?08/02/2021, 2:01 PM ? ?[facility] ?[place] ?[address] ?[place], [place], [address] ?Phone: [phone]   Fax:  [phone] ? ?Name: Daniel [name] ?MRN: [id number] ?Date of Birth: [birth year] ? ? ? ?

## 2021-08-04 ENCOUNTER — Ambulatory Visit: Payer: Medicare PPO | Admitting: Physical Therapy

## 2021-08-04 DIAGNOSIS — R262 Difficulty in walking, not elsewhere classified: Secondary | ICD-10-CM

## 2021-08-04 DIAGNOSIS — M6281 Muscle weakness (generalized): Secondary | ICD-10-CM | POA: Diagnosis not present

## 2021-08-04 NOTE — Therapy (Signed)
[facility] ?[place] ?[address] ?[place], [place], [address] ?Phone: [phone]   Fax:  [phone] ? ?Physical Therapy Treatment ? ?Patient Details  ?Name: Daniel [name] ?MRN: [id number] ?Date of Birth: [birth year] ?Referring Provider (PT): Repass ? ? ?Encounter Date: 08/04/2021 ? ? PT End of Session - 08/04/21 1528   ? ? Visit Number 51   ? Authorization Type Humana   ? PT Start Time 1435   ? PT Stop Time 1530   ? PT Time Calculation (min) 55 min   ? ?  ?  ? ?  ? ? ?Past Medical History:  ?Diagnosis Date  ? Arthritis   ? Diabetes mellitus without complication ([place])   ? Elevated PSA 08/29/2012  ? 7.22  ? Hyperlipidemia   ? Hypertension   ? Obstructive sleep apnea   ? CPAP  ? Prostate cancer [facility]) Feb 2014  ? Spinal stenosis   ? ? ?Past Surgical History:  ?Procedure Laterality Date  ? KNEE SURGERY    ? torn meniscus  ? PILONIDAL CYST EXCISION    ? lower spine age 33  ? PROSTATE BIOPSY Bilateral 08/29/2012  ? gleason 3+3=6  Dr.Marc Nesi  ? RADIOACTIVE SEED IMPLANT N/A 11/19/2012  ? Procedure: RADIOACTIVE SEED IMPLANT;  Surgeon: Hanley Ben, MD;  [facility];  Service: Urology;  Laterality: N/A;  ? SPINAL CORD DECOMPRESSION    ? Spinal injection  2012  ? spinal stenosis  ? TONSILLECTOMY    ? as child age 33 or 67  ? ? ?There were no vitals filed for this visit. ? ? Subjective Assessment - 08/04/21 1435   ? ? Subjective worried abput running out of visits- " do not want to miss a beat"   ? Currently in Pain? Yes   ? Pain Score 2    ? Pain Location Knee   ? Pain Orientation Left   ? ?  ?  ? ?  ? ? ? ? ? ? ? ? ? ? ? ? ? ? ? ? ? ? ? ? [place] Adult PT Treatment/Exercise - 08/04/21 0001   ? ?  ? Ambulation/Gait  ? Gait Comments amb 160 feet without AD no LOB, with good stride   some increased LBP and post legs  ?  ? Lumbar Exercises: Aerobic  ? Elliptical L 4 4 min   ? UBE (Upper Arm Bike) L 5 3 min fwd/3 min back   ? Nustep L 6 8 min   ?  ? Lumbar Exercises: Standing  ? Other  Standing Lumbar Exercises 6 inch step tap 10 x each leg then 10 x alt single UE support needed   ? Other Standing Lumbar Exercises 15# cable pulleys shld ext and row 15 x each   wt ball func reaching 2 sets 10  ?  ? Lumbar Exercises: Seated  ? Sit to Stand 10 reps   wy ball OH press  ? ?  ?  ? ?  ? ? ? ? ? ? ? ? ? ? ? ? PT Short Term Goals - 05/10/21 1505   ? ?  ? PT SHORT TERM GOAL #1  ? Title give HEP that he will do at home   ? Status Achieved   ? ?  ?  ? ?  ? ? ? ? PT Long Term Goals - 08/04/21 1447   ? ?  ? PT LONG TERM GOAL #1  ? Title independent with an advanced  HEP   ? Baseline evolving as we progress   ? Status Achieved   ?  ? PT LONG TERM GOAL #3  ? Title walk with a cane or lessed AD x 500 feet   ? Status Partially Met   ?  ? PT LONG TERM GOAL #4  ? Title get up from sitting without using hands   ? Baseline varies on height and surface but more consistant   ? Status Partially Met   ? ?  ?  ? ?  ? ? ? ? ? ? ? ? Plan - 08/04/21 1529   ? ? Clinical Impression Statement pt incrased time on elliptical by 1 min. increased walk to 160 feet with good stride but very fatigued after with some LBP and butticks- explained ot pt when walking with walker wt fwd and extensors not working like they have to unsupported. progressed strengthening and working towards goals pt reuires UE support and or external support with many ex needed d/t instability   ? PT Treatment/Interventions ADLs/Self Care Home Management;Gait training;Neuromuscular re-education;Balance training;Therapeutic exercise;Therapeutic activities;Functional mobility training;Stair training;Patient/family education;Manual techniques   ? PT Next Visit Plan continue to try to push his strength and function, challenge him dynamically   ? ?  ?  ? ?  ? ? ?Patient will benefit from skilled therapeutic intervention in order to improve the following deficits and impairments:  Abnormal gait, Decreased coordination, Decreased range of motion, Difficulty walking,  Decreased endurance, Cardiopulmonary status limiting activity, Decreased activity tolerance, Decreased balance, Improper body mechanics, Postural dysfunction, Decreased strength, Decreased mobility ? ?Visit Diagnosis: ?Difficulty in walking, not elsewhere classified ? ?Muscle weakness (generalized) ? ? ? ? ?Problem List ?Patient Active Problem List  ? Diagnosis Date Noted  ? Prostate cancer ([place]) 09/26/2012  ? DYSLIPIDEMIA 05/02/2007  ? MORBID OBESITY 05/02/2007  ? OBSTRUCTIVE SLEEP APNEA 05/02/2007  ? HYPERTENSION 05/02/2007  ? ? ?Aqueelah Cotrell,[name], PTA ?08/04/2021, 3:33 PM ? ?[facility] [place] ?[place] ?[address] ?[place], [place], [address] ?Phone: [phone]   Fax:  [phone] ? ?Name: Navraj Dreibelbis ?MRN: [id number] ?Date of Birth: 1956-04-22 ? ? ? ?

## 2021-08-09 ENCOUNTER — Encounter: Payer: Self-pay | Admitting: Physical Therapy

## 2021-08-09 ENCOUNTER — Ambulatory Visit: Payer: Medicare PPO | Admitting: Physical Therapy

## 2021-08-09 DIAGNOSIS — R262 Difficulty in walking, not elsewhere classified: Secondary | ICD-10-CM

## 2021-08-09 DIAGNOSIS — M6281 Muscle weakness (generalized): Secondary | ICD-10-CM

## 2021-08-09 NOTE — Therapy (Signed)
[facility] ?[place] ?[address] ?[place], [place], [address] ?Phone: [phone]   Fax:  [phone] ? ?Physical Therapy Treatment ? ?Patient Details  ?Name: Daniel [name] ?MRN: [id number] ?Date of Birth: 02-16-1956 ?Referring Provider (PT): Repass ? ? ?Encounter Date: 08/09/2021 ? ? PT End of Session - 08/09/21 1520   ? ? Visit Number 63   ? Authorization Type Humana   ? Authorization Time Period 6/12 [place]aggers   ? PT Start Time 1432   ? PT Stop Time 1528   ? PT Time Calculation (min) 56 min   ? Activity Tolerance Patient tolerated treatment well   ? Behavior During Therapy [facility] for tasks assessed/performed   ? ?  ?  ? ?  ? ? ?Past Medical History:  ?Diagnosis Date  ? Arthritis   ? Diabetes mellitus without complication ([place])   ? Elevated PSA 08/29/2012  ? 7.22  ? Hyperlipidemia   ? Hypertension   ? Obstructive sleep apnea   ? CPAP  ? Prostate cancer [facility]) Feb 2014  ? Spinal stenosis   ? ? ?Past Surgical History:  ?Procedure Laterality Date  ? KNEE SURGERY    ? torn meniscus  ? PILONIDAL CYST EXCISION    ? lower spine age 20  ? PROSTATE BIOPSY Bilateral 08/29/2012  ? gleason 3+3=6  Dr.Marc Nesi  ? RADIOACTIVE SEED IMPLANT N/A 11/19/2012  ? Procedure: RADIOACTIVE SEED IMPLANT;  Surgeon: Hanley Ben, MD;  [facility];  Service: Urology;  Laterality: N/A;  ? SPINAL CORD DECOMPRESSION    ? Spinal injection  2012  ? spinal stenosis  ? TONSILLECTOMY    ? as child age 87 or 53  ? ? ?There were no vitals filed for this visit. ? ? Subjective Assessment - 08/09/21 1433   ? ? Subjective Patient reports the last treatment was tough, physically but I need to push myself mentally   ? Currently in Pain? No/denies   ? ?  ?  ? ?  ? ? ? ? ? ? ? ? ? ? ? ? ? ? ? ? ? ? ? ? [place] Adult PT Treatment/Exercise - 08/09/21 0001   ? ?  ? Ambulation/Gait  ? Gait Comments amb 160 feet without AD no LOB, with good stride, tried to do two SPC's but this really slowed him down, then 75 feet  on this  second time he had two bobbles "feel wobbly"   ?  ? Lumbar Exercises: Aerobic  ? Elliptical L 4 5 min   ? UBE (Upper Arm Bike) L 5 3 min fwd/3 min back, power burst last 10 seconds of each minute   ? Nustep L 6 8 min   ?  ? Lumbar Exercises: Machines for Strengthening  ? Other Lumbar Machine Exercise 15# straight arm pulls   ? Other Lumbar Machine Exercise 35# triceps   ?  ? Lumbar Exercises: Seated  ? Other Seated Lumbar Exercises lower back stretch   ?  ? Lumbar Exercises: Supine  ? Bridge with clamshell 20 reps   ? Other Supine Lumbar Exercises feet on ball K2C, obliques, small bridges and isometrci abs   ? ?  ?  ? ?  ? ? ? ? ? ? ? ? ? ? ? ? PT Short Term Goals - 05/10/21 1505   ? ?  ? PT SHORT TERM GOAL #1  ? Title give HEP that he will do at home   ? Status Achieved   ? ?  ?  ? ?  ? ? ? ?  PT Long Term Goals - 08/09/21 1535   ? ?  ? PT LONG TERM GOAL #3  ? Title walk with a cane or lessed AD x 500 feet   ? Status Partially Met   ? ?  ?  ? ?  ? ? ? ? ? ? ? ? Plan - 08/09/21 1522   ? ? Clinical Impression Statement Able to go to 5 minutes on the elliptical., I also added power bursts at the end of each minute on the UBE.  I tried two SPC's walking to see if this helped the LBP with the walking but this just slowed him down. We tried the core bridges and this caused the left HS to cramp   ? PT Next Visit Plan continue to try to push his strength and function, challenge him dynamically   ? Consulted and Agree with Plan of Care Patient   ? ?  ?  ? ?  ? ? ?Patient will benefit from skilled therapeutic intervention in order to improve the following deficits and impairments:  Abnormal gait, Decreased coordination, Decreased range of motion, Difficulty walking, Decreased endurance, Cardiopulmonary status limiting activity, Decreased activity tolerance, Decreased balance, Improper body mechanics, Postural dysfunction, Decreased strength, Decreased mobility ? ?Visit Diagnosis: ?Difficulty in walking, not elsewhere  classified ? ?Muscle weakness (generalized) ? ? ? ? ?Problem List ?Patient Active Problem List  ? Diagnosis Date Noted  ? Prostate cancer ([place]) 09/26/2012  ? DYSLIPIDEMIA 05/02/2007  ? MORBID OBESITY 05/02/2007  ? OBSTRUCTIVE SLEEP APNEA 05/02/2007  ? HYPERTENSION 05/02/2007  ? ? Sumner Boast, PT ?08/09/2021, 3:35 PM ? ?[place] ?[address] ?[address] ?[place], [place], [address] ?Phone: [phone]   Fax:  [phone] ? ?Name: Daniel [name] ?MRN: [id number] ?Date of Birth: [birth year] ? ? ? ?

## 2021-08-11 ENCOUNTER — Ambulatory Visit: Payer: Medicare PPO | Admitting: Physical Therapy

## 2021-08-11 DIAGNOSIS — M6281 Muscle weakness (generalized): Secondary | ICD-10-CM | POA: Diagnosis not present

## 2021-08-11 DIAGNOSIS — R262 Difficulty in walking, not elsewhere classified: Secondary | ICD-10-CM | POA: Diagnosis not present

## 2021-08-11 NOTE — Therapy (Signed)
[place] ?[place] ?[address] ?[place], [place], [address] ?Phone: [phone]   Fax:  [phone] ? ?Physical Therapy Treatment ? ?Patient Details  ?Name: Daniel [name] ?MRN: [id number] ?Date of Birth: 1955-10-06 ?Referring Provider (PT): Repass ? ? ?Encounter Date: 08/11/2021 ? ? PT End of Session - 08/11/21 1530   ? ? Visit Number 73   ? Authorization Type Humana   ? PT Start Time 1430   ? PT Stop Time 1535   ? PT Time Calculation (min) 65 min   ? ?  ?  ? ?  ? ? ?Past Medical History:  ?Diagnosis Date  ? Arthritis   ? Diabetes mellitus without complication ([place])   ? Elevated PSA 08/29/2012  ? 7.22  ? Hyperlipidemia   ? Hypertension   ? Obstructive sleep apnea   ? CPAP  ? Prostate cancer [facility]) Feb 2014  ? Spinal stenosis   ? ? ?Past Surgical History:  ?Procedure Laterality Date  ? KNEE SURGERY    ? torn meniscus  ? PILONIDAL CYST EXCISION    ? lower spine age 66  ? PROSTATE BIOPSY Bilateral 08/29/2012  ? gleason 3+3=6  Dr.Marc Nesi  ? RADIOACTIVE SEED IMPLANT N/A 11/19/2012  ? Procedure: RADIOACTIVE SEED IMPLANT;  Surgeon: Hanley Ben, MD;  [facility];  Service: Urology;  Laterality: N/A;  ? SPINAL CORD DECOMPRESSION    ? Spinal injection  2012  ? spinal stenosis  ? TONSILLECTOMY    ? as child age 66 or 66  ? ? ?There were no vitals filed for this visit. ? ? Subjective Assessment - 08/11/21 1444   ? ? Subjective back popped last session and felt better with ball ex   ? Currently in Pain? No/denies   ? ?  ?  ? ?  ? ? ? ? ? ? ? ? ? ? ? ? ? ? ? ? ? ? ? ? [place] Adult PT Treatment/Exercise - 08/11/21 0001   ? ?  ? Ambulation/Gait  ? Gait Comments amb 6 min outside from front door to back door with incline and curb both challenging and required CG A   mid session 125 feet without AD CGA  ?  ? Lumbar Exercises: Aerobic  ? UBE (Upper Arm Bike) L 6 3 min fwd/3 min back,   ? Nustep L 6 8 min   ?  ? Lumbar Exercises: Standing  ? Other Standing Lumbar Exercises 40# sports cord  5 x fwd/back. 2 x each side CGA   leading RT much harder min A  ? Other Standing Lumbar Exercises blue ball mod dead lift into OH press   ?  ? Knee/Hip Exercises: Machines for Strengthening  ? Cybex Knee Extension 35# 3 sets 10   ? Cybex Knee Flexion 65# 3 sets 10   ? ?  ?  ? ?  ? ? ? ? ? ? ? ? ? ? ? ? PT Short Term Goals - 05/10/21 1505   ? ?  ? PT SHORT TERM GOAL #1  ? Title give HEP that he will do at home   ? Status Achieved   ? ?  ?  ? ?  ? ? ? ? PT Long Term Goals - 08/11/21 1529   ? ?  ? PT LONG TERM GOAL #1  ? Title independent with an advanced HEP   ? Baseline evolving as we progress   ? Status Achieved   ?  ?  PT LONG TERM GOAL #3  ? Title walk with a cane or lessed AD x 500 feet   ? Status Partially Met   ?  ? PT LONG TERM GOAL #4  ? Title get up from sitting without using hands   ? Baseline varies on height and surface but more consistant   ? Status Partially Met   ? ?  ?  ? ?  ? ? ? ? ? ? ? ? Plan - 08/11/21 1531   ? ? Clinical Impression Statement amb 6 min outside with RW, incline and curd where challenging and fatiguing and required CGA. resisted gait leading with RT was very difficult. continue to progress func, strength and balance. progressing with goals and will need to increase goals before next recert   ? PT Treatment/Interventions ADLs/Self Care Home Management;Gait training;Neuromuscular re-education;Balance training;Therapeutic exercise;Therapeutic activities;Functional mobility training;Stair training;Patient/family education;Manual techniques   ? PT Next Visit Plan continue to try to push his strength and function, challenge him dynamically   ? ?  ?  ? ?  ? ? ?Patient will benefit from skilled therapeutic intervention in order to improve the following deficits and impairments:  Abnormal gait, Decreased coordination, Decreased range of motion, Difficulty walking, Decreased endurance, Cardiopulmonary status limiting activity, Decreased activity tolerance, Decreased balance, Improper body  mechanics, Postural dysfunction, Decreased strength, Decreased mobility ? ?Visit Diagnosis: ?Difficulty in walking, not elsewhere classified ? ?Muscle weakness (generalized) ? ? ? ? ?Problem List ?Patient Active Problem List  ? Diagnosis Date Noted  ? Prostate cancer ([place]) 09/26/2012  ? DYSLIPIDEMIA 05/02/2007  ? MORBID OBESITY 05/02/2007  ? OBSTRUCTIVE SLEEP APNEA 05/02/2007  ? HYPERTENSION 05/02/2007  ? ? ?Iracema Lanagan,[name], PTA ?08/11/2021, 3:33 PM ? ? ?[place] ?[address] ?[place], [place], [address] ?Phone: [phone]   Fax:  [phone] ? ?Name: Canaan Holzer ?MRN: [id number] ?Date of Birth: 1956-04-11 ? ? ? ?

## 2021-08-17 ENCOUNTER — Ambulatory Visit: Payer: Medicare PPO | Admitting: Physical Therapy

## 2021-08-17 ENCOUNTER — Encounter: Payer: Medicare PPO | Admitting: Physical Therapy

## 2021-08-18 ENCOUNTER — Ambulatory Visit: Payer: Medicare PPO | Admitting: Physical Therapy

## 2021-08-18 ENCOUNTER — Encounter: Payer: Self-pay | Admitting: Physical Therapy

## 2021-08-18 DIAGNOSIS — R262 Difficulty in walking, not elsewhere classified: Secondary | ICD-10-CM | POA: Diagnosis not present

## 2021-08-18 DIAGNOSIS — M6281 Muscle weakness (generalized): Secondary | ICD-10-CM

## 2021-08-18 NOTE — Therapy (Signed)
[place] ?[place] ?[address] ?[place], [place], [address] ?Phone: [phone]   Fax:  [phone] ? ?Physical Therapy Treatment ? ?Patient Details  ?Name: Daniel [name] ?MRN: [id number] ?Date of Birth: 1955-04-30 ?Referring Provider (PT): Repass ? ? ?Encounter Date: 08/18/2021 ? ? PT End of Session - 08/18/21 1631   ? ? Visit Number 44   ? Date for PT Re-Evaluation 09/13/21   ? Authorization Type Humana   ? Authorization Time Period 8/12 humana auth   ? PT Start Time 1515   ? PT Stop Time 1620   ? PT Time Calculation (min) 65 min   ? Activity Tolerance Patient tolerated treatment well   ? Behavior During Therapy [facility] for tasks assessed/performed   ? ?  ?  ? ?  ? ? ?Past Medical History:  ?Diagnosis Date  ? Arthritis   ? Diabetes mellitus without complication ([place])   ? Elevated PSA 08/29/2012  ? 7.22  ? Hyperlipidemia   ? Hypertension   ? Obstructive sleep apnea   ? CPAP  ? Prostate cancer [facility]) Feb 2014  ? Spinal stenosis   ? ? ?Past Surgical History:  ?Procedure Laterality Date  ? KNEE SURGERY    ? torn meniscus  ? PILONIDAL CYST EXCISION    ? lower spine age 53  ? PROSTATE BIOPSY Bilateral 08/29/2012  ? gleason 3+3=6  Dr.Marc Nesi  ? RADIOACTIVE SEED IMPLANT N/A 11/19/2012  ? Procedure: RADIOACTIVE SEED IMPLANT;  Surgeon: Hanley Ben, MD;  [facility];  Service: Urology;  Laterality: N/A;  ? SPINAL CORD DECOMPRESSION    ? Spinal injection  2012  ? spinal stenosis  ? TONSILLECTOMY    ? as child age 1 or 12  ? ? ?There were no vitals filed for this visit. ? ? Subjective Assessment - 08/18/21 1517   ? ? Subjective Feeling sluggish today   ? Currently in Pain? No/denies   ? ?  ?  ? ?  ? ? ? ? ? ? ? ? ? ? ? ? ? ? ? ? ? ? ? ? [place] Adult PT Treatment/Exercise - 08/18/21 0001   ? ?  ? Transfers  ? Comments had the large mat quadruple fold getting down and up from the needs   ?  ? Ambulation/Gait  ? Gait Comments ambulate no device CGA 200' x 2   ?  ? High Level Balance  ?  High Level Balance Comments 6" step toe touches, perturbations trying to get him to react and catch self   ?  ? Lumbar Exercises: Aerobic  ? UBE (Upper Arm Bike) L 6 3 min fwd/3 min back, power burst last 10 seconds x 6   ? Nustep L 6 8 min   ?  ? Lumbar Exercises: Machines for Strengthening  ? Leg Press 90# x 10, 140# 2x5,   ? Other Lumbar Machine Exercise 5# hip abd and extension x10 each   ?  ? Lumbar Exercises: Seated  ? Other Seated Lumbar Exercises ball slams, ball catch and throw for core work   ? Other Seated Lumbar Exercises lower back stretch with PT overpressure   ? ?  ?  ? ?  ? ? ? ? ? ? ? ? ? ? ? ? PT Short Term Goals - 05/10/21 1505   ? ?  ? PT SHORT TERM GOAL #1  ? Title give HEP that he will do at home   ? Status Achieved   ? ?  ?  ? ?  ? ? ? ?  PT Long Term Goals - 08/18/21 1635   ? ?  ? PT LONG TERM GOAL #1  ? Title independent with an advanced HEP   ? Status Achieved   ?  ? PT LONG TERM GOAL #2  ? Title decrease TUG time to 18 seconds   ? Status Achieved   ?  ? PT LONG TERM GOAL #3  ? Title walk with a cane or lessed AD x 500 feet   ? Status Partially Met   ?  ? PT LONG TERM GOAL #4  ? Title get up from sitting without using hands   ? Status Partially Met   ? ?  ?  ? ?  ? ? ? ? ? ? ? ? Plan - 08/18/21 1632   ? ? Clinical Impression Statement Tried some higher level balance activities with perturbations, 6" step toe touches.  also got down on knees on high mat kneeling, this did cause some cramping in the HS and he has very tight hip flexores.Added hip abduction and extension, this was very tough for him.  Overall he is progressing very well just had a long way to progress and still has a lot of room to improve to get to PLOF   ? PT Next Visit Plan continue to try to push his strength and function, challenge him dynamically   ? Consulted and Agree with Plan of Care Patient   ? ?  ?  ? ?  ? ? ?Patient will benefit from skilled therapeutic intervention in order to improve the following deficits and  impairments:  Abnormal gait, Decreased coordination, Decreased range of motion, Difficulty walking, Decreased endurance, Cardiopulmonary status limiting activity, Decreased activity tolerance, Decreased balance, Improper body mechanics, Postural dysfunction, Decreased strength, Decreased mobility ? ?Visit Diagnosis: ?Difficulty in walking, not elsewhere classified ? ?Muscle weakness (generalized) ? ? ? ? ?Problem List ?Patient Active Problem List  ? Diagnosis Date Noted  ? Prostate cancer ([place]) 09/26/2012  ? DYSLIPIDEMIA 05/02/2007  ? MORBID OBESITY 05/02/2007  ? OBSTRUCTIVE SLEEP APNEA 05/02/2007  ? HYPERTENSION 05/02/2007  ? ? Sumner Boast, PT ?08/18/2021, 4:36 PM ? ?[place] ?[place] ?[address] ?[place], [place], [address] ?Phone: [phone]   Fax:  [phone] ? ?Name: Daniel [name] ?MRN: [id number] ?Date of Birth: 1955/08/14 ? ? ? ?

## 2021-08-19 ENCOUNTER — Encounter: Payer: Medicare PPO | Admitting: Physical Therapy

## 2021-08-25 ENCOUNTER — Encounter: Payer: Self-pay | Admitting: Physical Therapy

## 2021-08-25 ENCOUNTER — Ambulatory Visit: Payer: Medicare PPO | Attending: Internal Medicine | Admitting: Physical Therapy

## 2021-08-25 DIAGNOSIS — R262 Difficulty in walking, not elsewhere classified: Secondary | ICD-10-CM | POA: Diagnosis not present

## 2021-08-25 DIAGNOSIS — M6281 Muscle weakness (generalized): Secondary | ICD-10-CM | POA: Diagnosis not present

## 2021-08-25 NOTE — Therapy (Signed)
[facility] ?[place] [address] ?[place], [place], [address] ?Phone: [phone]   Fax:  [phone] ? ?Physical Therapy Treatment ? ?Patient Details  ?Name: Daniel [name] ?MRN: [id number] ?Date of Birth: 1955-10-10 ?Referring Provider (PT): Repass ? ? ?Encounter Date: 08/25/2021 ? ? PT End of Session - 08/25/21 1506   ? ? Visit Number 91   ? Date for PT Re-Evaluation 09/13/21   ? PT Start Time 1415   ? PT Stop Time 1510   ? PT Time Calculation (min) 55 min   ? Activity Tolerance Patient tolerated treatment well   ? Behavior During Therapy [facility] for tasks assessed/performed   ? ?  ?  ? ?  ? ? ?Past Medical History:  ?Diagnosis Date  ? Arthritis   ? Diabetes mellitus without complication ([place])   ? Elevated PSA 08/29/2012  ? 7.22  ? Hyperlipidemia   ? Hypertension   ? Obstructive sleep apnea   ? CPAP  ? Prostate cancer [facility]) Feb 2014  ? Spinal stenosis   ? ? ?Past Surgical History:  ?Procedure Laterality Date  ? KNEE SURGERY    ? torn meniscus  ? PILONIDAL CYST EXCISION    ? lower spine age 79  ? PROSTATE BIOPSY Bilateral 08/29/2012  ? gleason 3+3=6  Dr.Marc Nesi  ? RADIOACTIVE SEED IMPLANT N/A 11/19/2012  ? Procedure: RADIOACTIVE SEED IMPLANT;  Surgeon: Hanley Ben, MD;  [facility];  Service: Urology;  Laterality: N/A;  ? SPINAL CORD DECOMPRESSION    ? Spinal injection  2012  ? spinal stenosis  ? TONSILLECTOMY    ? as child age 69 or 64  ? ? ?There were no vitals filed for this visit. ? ? Subjective Assessment - 08/25/21 1415   ? ? Subjective "I feel good, just behind my L knee, It has been getting better all week" Some popping in the bone   ? Currently in Pain? No/denies   ? ?  ?  ? ?  ? ? ? ? ? ? ? ? ? ? ? ? ? ? ? ? ? ? ? ? [facility] Adult PT Treatment/Exercise - 08/25/21 0001   ? ?  ? Ambulation/Gait  ? Stairs Yes   ? Stairs Assistance 5: Supervision;4: Min guard   ? Stair Management Technique One rail Left;Alternating pattern;Step to pattern;Two rails   descending with one  rail on L  ? Number of Stairs 9   ? Height of Stairs 4   ? Gait Comments ambulate no device CGA 75' x   glute tightness  ?  ? Lumbar Exercises: Aerobic  ? UBE (Upper Arm Bike) L 6 3 min fwd/3 min back, power burst last 10 seconds x 6   ? Nustep L 6 8 min   ?  ? Lumbar Exercises: Machines for Strengthening  ? Leg Press 80# x 10, SL 40lb x5 each, 100lb x10   ?  ? Lumbar Exercises: Seated  ? Other Seated Lumbar Exercises lower back stretch with PT overpressure   ?  ? Knee/Hip Exercises: Machines for Strengthening  ? Cybex Knee Extension 25# 3 sets 10   ? Cybex Knee Flexion 55# 3 sets 10   ? ?  ?  ? ?  ? ? ? ? ? ? ? ? ? ? ? ? PT Short Term Goals - 05/10/21 1505   ? ?  ? PT SHORT TERM GOAL #1  ? Title give HEP that he will do at home   ?  Status Achieved   ? ?  ?  ? ?  ? ? ? ? PT Long Term Goals - 08/18/21 1635   ? ?  ? PT LONG TERM GOAL #1  ? Title independent with an advanced HEP   ? Status Achieved   ?  ? PT LONG TERM GOAL #2  ? Title decrease TUG time to 18 seconds   ? Status Achieved   ?  ? PT LONG TERM GOAL #3  ? Title walk with a cane or lessed AD x 500 feet   ? Status Partially Met   ?  ? PT LONG TERM GOAL #4  ? Title get up from sitting without using hands   ? Status Partially Met   ? ?  ?  ? ?  ? ? ? ? ? ? ? ? Plan - 08/25/21 1507   ? ? Clinical Impression Statement Pt enters reporting that his bones in his posterior L knee felt out of place. This did limit his gait tolerance today. Pt expressed that he needed to work on stairs to get in and out of his new rook at home. Pt reports only having a rail on the R side at home when entering the new room. PT unable to ascend stairs with one arm support. Pt was able to descend with LUE support only requiring CGA. Will continue to improve towards PLOF   ? Rehab Potential Good   ? PT Frequency 2x / week   ? PT Treatment/Interventions ADLs/Self Care Home Management;Gait training;Neuromuscular re-education;Balance training;Therapeutic exercise;Therapeutic  activities;Functional mobility training;Stair training;Patient/family education;Manual techniques   ? PT Next Visit Plan continue to try to push his strength and function, challenge him dynamically   ? ?  ?  ? ?  ? ? ?Patient will benefit from skilled therapeutic intervention in order to improve the following deficits and impairments:  Abnormal gait, Decreased coordination, Decreased range of motion, Difficulty walking, Decreased endurance, Cardiopulmonary status limiting activity, Decreased activity tolerance, Decreased balance, Improper body mechanics, Postural dysfunction, Decreased strength, Decreased mobility ? ?Visit Diagnosis: ?Difficulty in walking, not elsewhere classified ? ? ? ? ?Problem List ?Patient Active Problem List  ? Diagnosis Date Noted  ? Prostate cancer ([place]) 09/26/2012  ? DYSLIPIDEMIA 05/02/2007  ? MORBID OBESITY 05/02/2007  ? OBSTRUCTIVE SLEEP APNEA 05/02/2007  ? HYPERTENSION 05/02/2007  ? ? ?[name], PTA ?08/25/2021, 3:13 PM ? ?[place] ?[address] ?[address] ?[place], [place], [address] ?Phone: [phone]   Fax:  [phone] ? ?Name: Daniel [name] ?MRN: [id number] ?Date of Birth: 10/20/1955 ? ? ? ?

## 2021-08-30 ENCOUNTER — Encounter: Payer: Self-pay | Admitting: Physical Therapy

## 2021-08-30 ENCOUNTER — Ambulatory Visit: Payer: Medicare PPO | Admitting: Physical Therapy

## 2021-08-30 DIAGNOSIS — M6281 Muscle weakness (generalized): Secondary | ICD-10-CM

## 2021-08-30 DIAGNOSIS — R262 Difficulty in walking, not elsewhere classified: Secondary | ICD-10-CM

## 2021-08-30 NOTE — Therapy (Signed)
[facility] ?[place] [address] ?[place], [place], [address] ?Phone: [phone]   Fax:  [phone] ? ?Physical Therapy Treatment ? ?Patient Details  ?Name: Daniel [name] ?MRN: [id number] ?Date of Birth: 01-05-1956 ?Referring Provider (PT): Repass ? ? ?Encounter Date: 08/30/2021 ? ? PT End of Session - 08/30/21 1507   ? ? Visit Number 28   ? Date for PT Re-Evaluation 09/13/21   ? PT Start Time 1415   ? PT Stop Time 1510   ? PT Time Calculation (min) 55 min   ? Activity Tolerance Patient tolerated treatment well   ? Behavior During Therapy [facility] for tasks assessed/performed   ? ?  ?  ? ?  ? ? ?Past Medical History:  ?Diagnosis Date  ? Arthritis   ? Diabetes mellitus without complication ([place])   ? Elevated PSA 08/29/2012  ? 7.22  ? Hyperlipidemia   ? Hypertension   ? Obstructive sleep apnea   ? CPAP  ? Prostate cancer [facility]) Feb 2014  ? Spinal stenosis   ? ? ?Past Surgical History:  ?Procedure Laterality Date  ? KNEE SURGERY    ? torn meniscus  ? PILONIDAL CYST EXCISION    ? lower spine age 76  ? PROSTATE BIOPSY Bilateral 08/29/2012  ? gleason 3+3=6  Dr.Marc Nesi  ? RADIOACTIVE SEED IMPLANT N/A 11/19/2012  ? Procedure: RADIOACTIVE SEED IMPLANT;  Surgeon: Hanley Ben, MD;  Location[facility];  Service: Urology;  Laterality: N/A;  ? SPINAL CORD DECOMPRESSION    ? Spinal injection  2012  ? spinal stenosis  ? TONSILLECTOMY    ? as child age 65 or 48  ? ? ?There were no vitals filed for this visit. ? ? Subjective Assessment - 08/30/21 1418   ? ? Subjective "Doing good" "Knees are fine, still has a little bit in the L leg"   ? Currently in Pain? No/denies   ? ?  ?  ? ?  ? ? ? ? ? ? ? ? ? ? ? ? ? ? ? ? ? ? ? ? [facility] Adult PT Treatment/Exercise - 08/30/21 0001   ? ?  ? Ambulation/Gait  ? Stairs Yes   ? Stairs Assistance 5: Supervision;4: Min guard   ? Stair Management Technique One rail Left;Alternating pattern;Step to pattern;Two rails   ? Number of Stairs 9   ? Height of Stairs 4   ? Gait  Comments ambulate no device CGA 175'  then 138f   ?  ? Lumbar Exercises: Stretches  ? Active Hamstring Stretch Left;Right;2 reps;10 seconds;20 seconds   ? Other Lumbar Stretch Exercise Trunk flex with Pball   ?  ? Lumbar Exercises: Aerobic  ? UBE (Upper Arm Bike) L 5 3 min fwd/3 min back, power burst last 10 seconds x 6   ? Nustep L 6 8 min   ?  ? Knee/Hip Exercises: Machines for Strengthening  ? Cybex Knee Extension 25# 2 sets 10   ? Cybex Knee Flexion 55# 3 sets 10   ?  ? Knee/Hip Exercises: Seated  ? Sit to Sand 2 sets;10 reps;without UE support   OHP blue ball  ? ?  ?  ? ?  ? ? ? ? ? ? ? ? ? ? ? ? PT Short Term Goals - 05/10/21 1505   ? ?  ? PT SHORT TERM GOAL #1  ? Title give HEP that he will do at home   ? Status Achieved   ? ?  ?  ? ?  ? ? ? ?  PT Long Term Goals - 08/30/21 1511   ? ?  ? PT LONG TERM GOAL #1  ? Title independent with an advanced HEP   ? Status Achieved   ?  ? PT LONG TERM GOAL #2  ? Title decrease TUG time to 18 seconds   ? Status Achieved   ?  ? PT LONG TERM GOAL #3  ? Title walk with a cane or lessed AD x 500 feet   ? Status Partially Met   ? ?  ?  ? ?  ? ? ? ? ? ? ? ? Plan - 08/30/21 1511   ? ? Clinical Impression Statement Pt enters session feeling well. Pt able to progress with his gait without AD today. Increase fatigue noted through session. cue for eccentric control needed with hamstring curls. Some LOB today with sit to stands. Stairs remains difficult. Pt was able to ascend stairs once with two rails on R side. Good carryover from last session descending stairs with one UE support on L side.   ? Rehab Potential Good   ? PT Frequency 2x / week   ? PT Duration 12 weeks   ? PT Treatment/Interventions ADLs/Self Care Home Management;Gait training;Neuromuscular re-education;Balance training;Therapeutic exercise;Therapeutic activities;Functional mobility training;Stair training;Patient/family education;Manual techniques   ? PT Next Visit Plan continue to try to push his strength and  function, challenge him dynamically   ? ?  ?  ? ?  ? ? ?Patient will benefit from skilled therapeutic intervention in order to improve the following deficits and impairments:  Abnormal gait, Decreased coordination, Decreased range of motion, Difficulty walking, Decreased endurance, Cardiopulmonary status limiting activity, Decreased activity tolerance, Decreased balance, Improper body mechanics, Postural dysfunction, Decreased strength, Decreased mobility ? ?Visit Diagnosis: ?Difficulty in walking, not elsewhere classified ? ?Muscle weakness (generalized) ? ? ? ? ?Problem List ?Patient Active Problem List  ? Diagnosis Date Noted  ? Prostate cancer ([place]) 09/26/2012  ? DYSLIPIDEMIA 05/02/2007  ? MORBID OBESITY 05/02/2007  ? OBSTRUCTIVE SLEEP APNEA 05/02/2007  ? HYPERTENSION 05/02/2007  ? ? ?Daniel [name], PTA ?08/30/2021, 3:14 PM ? ?[place] ?[address] ?[place], [place], [address] ?Phone: [phone]   Fax:  [phone] ? ?Name: Daniel [name] ?MRN: [id number] ?Date of Birth: [birth year] ? ? ? ?

## 2021-09-01 ENCOUNTER — Ambulatory Visit: Payer: Medicare PPO | Admitting: Physical Therapy

## 2021-09-06 ENCOUNTER — Encounter: Payer: Self-pay | Admitting: Physical Therapy

## 2021-09-06 ENCOUNTER — Ambulatory Visit: Payer: Medicare PPO | Admitting: Physical Therapy

## 2021-09-06 DIAGNOSIS — M6281 Muscle weakness (generalized): Secondary | ICD-10-CM

## 2021-09-06 DIAGNOSIS — R262 Difficulty in walking, not elsewhere classified: Secondary | ICD-10-CM

## 2021-09-06 NOTE — Therapy (Signed)
?[facility] ?[address] ?[place], [place], [address] ?Phone: [phone]   Fax:  [phone] ? ?Physical Therapy Treatment ? ?Patient Details  ?Name: Daniel [name] ?MRN: [id number] ?Date of Birth: 08-22-1955 ?Referring Provider (PT): Repass ? ? ?Encounter Date: 09/06/2021 ? ? PT End of Session - 09/06/21 1523   ? ? Visit Number 30   ? Date for PT Re-Evaluation 09/13/21   ? Authorization Type Humana   ? PT Start Time 1415   ? PT Stop Time 1657   ? PT Time Calculation (min) 64 min   ? Activity Tolerance Patient tolerated treatment well   ? Behavior During Therapy [facility] for tasks assessed/performed   ? ?  ?  ? ?  ? ? ?Past Medical History:  ?Diagnosis Date  ? Arthritis   ? Diabetes mellitus without complication ([place])   ? Elevated PSA 08/29/2012  ? 7.22  ? Hyperlipidemia   ? Hypertension   ? Obstructive sleep apnea   ? CPAP  ? Prostate cancer [facility]) Feb 2014  ? Spinal stenosis   ? ? ?Past Surgical History:  ?Procedure Laterality Date  ? KNEE SURGERY    ? torn meniscus  ? PILONIDAL CYST EXCISION    ? lower spine age 12  ? PROSTATE BIOPSY Bilateral 08/29/2012  ? gleason 3+3=6  Dr.Marc Nesi  ? RADIOACTIVE SEED IMPLANT N/A 11/19/2012  ? Procedure: RADIOACTIVE SEED IMPLANT;  Surgeon: Hanley Ben, MD;  Location[facility];  Service: Urology;  Laterality: N/A;  ? SPINAL CORD DECOMPRESSION    ? Spinal injection  2012  ? spinal stenosis  ? TONSILLECTOMY    ? as child age 55 or 21  ? ? ?There were no vitals filed for this visit. ? ? Subjective Assessment - 09/06/21 1425   ? ? Subjective "Good" Legs are pretty good   ? Currently in Pain? No/denies   ? ?  ?  ? ?  ? ? ? ? ? ? ? ? ? ? ? ? ? ? ? ? ? ? ? ? [facility] Adult PT Treatment/Exercise - 09/06/21 0001   ? ?  ? Ambulation/Gait  ? Stairs Yes   ? Stairs Assistance 5: Supervision;4: Min guard   ? Stair Management Technique One rail Left;Alternating pattern;Step to pattern;Two rails   ? Number of Stairs 5   ? Height of Stairs 4   & 6  ? Gait  Comments ambulate no device CGA 220' x2   ?  ? Lumbar Exercises: Stretches  ? Active Hamstring Stretch Left;Right;3 reps;20 seconds   ? Other Lumbar Stretch Exercise Trunk flex with Pball   ?  ? Lumbar Exercises: Aerobic  ? UBE (Upper Arm Bike) L 5 3 min fwd/3 min back,   ? Nustep L 6 8 min   ?  ? Lumbar Exercises: Machines for Strengthening  ? Leg Press 90lb 2x10   ?  ? Knee/Hip Exercises: Machines for Strengthening  ? Cybex Knee Extension 25# 2 sets 10   ? Cybex Knee Flexion 55# 2 sets 15   ?  ? Knee/Hip Exercises: Seated  ? Sit to Sand 2 sets;10 reps;without UE support   holding 20lb KB  ? ?  ?  ? ?  ? ? ? ? ? ? ? ? ? ? ? ? PT Short Term Goals - 05/10/21 1505   ? ?  ? PT SHORT TERM GOAL #1  ? Title give HEP that he will do at home   ?  Status Achieved   ? ?  ?  ? ?  ? ? ? ? PT Long Term Goals - 09/06/21 1523   ? ?  ? PT LONG TERM GOAL #2  ? Title decrease TUG time to 18 seconds   ? Status Achieved   ?  ? PT LONG TERM GOAL #3  ? Title walk with a cane or lessed AD x 500 feet   ? Status Partially Met   ?  ? PT LONG TERM GOAL #4  ? Title get up from sitting without using hands   ? Status Partially Met   ? ?  ?  ? ?  ? ? ? ? ? ? ? ? Plan - 09/06/21 1524   ? ? Clinical Impression Statement Pt did well overall in today session. he ha progressed increasing his overall gait tolerance. Cues needed at times to manage gait speed. He did report that he felt like his knee did feel like it was going to buckle at one point. Sit to stands was really taxing on pt. Pt had difficulty with stairs requiring a lot of UE use. No issue with machine level interventions.   ? Rehab Potential Good   ? PT Frequency 2x / week   ? PT Treatment/Interventions ADLs/Self Care Home Management;Gait training;Neuromuscular re-education;Balance training;Therapeutic exercise;Therapeutic activities;Functional mobility training;Stair training;Patient/family education;Manual techniques   ? PT Next Visit Plan continue to try to push his strength and  function, challenge him dynamically   ? ?  ?  ? ?  ? ? ?Patient will benefit from skilled therapeutic intervention in order to improve the following deficits and impairments:  Abnormal gait, Decreased coordination, Decreased range of motion, Difficulty walking, Decreased endurance, Cardiopulmonary status limiting activity, Decreased activity tolerance, Decreased balance, Improper body mechanics, Postural dysfunction, Decreased strength, Decreased mobility ? ?Visit Diagnosis: ?Difficulty in walking, not elsewhere classified ? ?Muscle weakness (generalized) ? ? ? ? ?Problem List ?Patient Active Problem List  ? Diagnosis Date Noted  ? Prostate cancer ([place]) 09/26/2012  ? DYSLIPIDEMIA 05/02/2007  ? MORBID OBESITY 05/02/2007  ? OBSTRUCTIVE SLEEP APNEA 05/02/2007  ? HYPERTENSION 05/02/2007  ? ? ?[name], PTA ?09/06/2021, 3:27 PM ? ?[facility] ?[address] ?[address] ?[place], [place], [address] ?Phone: [phone]   Fax:  [phone] ? ?Name: Jaylene Schrom ?MRN: [id number] ?Date of Birth: 1955-09-28 ? ? ? ?

## 2021-09-08 ENCOUNTER — Ambulatory Visit: Payer: Medicare PPO | Admitting: Physical Therapy

## 2021-09-13 ENCOUNTER — Ambulatory Visit: Payer: Medicare PPO | Admitting: Physical Therapy

## 2021-09-13 ENCOUNTER — Encounter: Payer: Self-pay | Admitting: Physical Therapy

## 2021-09-13 DIAGNOSIS — M6281 Muscle weakness (generalized): Secondary | ICD-10-CM | POA: Diagnosis not present

## 2021-09-13 DIAGNOSIS — R262 Difficulty in walking, not elsewhere classified: Secondary | ICD-10-CM | POA: Diagnosis not present

## 2021-09-13 NOTE — Therapy (Signed)
[place]. [place], [place], [address] Phone: [phone]   Fax:  [phone]  Physical Therapy Treatment  Patient Details  Name: Daniel [name] MRN: [id number] Date of Birth: 07/01/1955 Referring Provider (PT): Repass   Encounter Date: 09/13/2021   PT End of Session - 09/13/21 1509     Visit Number 37    Authorization Type [employer]    PT Start Time 2202    PT Stop Time 1509    PT Time Calculation (min) 54 min    Activity Tolerance Patient tolerated treatment well    Behavior During Therapy [facility] for tasks assessed/performed             Past Medical History:  Diagnosis Date   Arthritis    Diabetes mellitus without complication ([place])    Elevated PSA 08/29/2012   7.22   Hyperlipidemia    Hypertension    Obstructive sleep apnea    CPAP   Prostate cancer [facility]) Feb 2014   Spinal stenosis     Past Surgical History:  Procedure Laterality Date   KNEE SURGERY     torn meniscus   PILONIDAL CYST EXCISION     lower spine age 56   PROSTATE BIOPSY Bilateral 08/29/2012   gleason 3+3=6  Dr.Marc Nesi   RADIOACTIVE SEED IMPLANT N/A 11/19/2012   Procedure: RADIOACTIVE SEED IMPLANT;  Surgeon: Hanley Ben, MD;  [facility]: [facility];  Service: Urology;  Laterality: N/A;   SPINAL CORD DECOMPRESSION     Spinal injection  2012   spinal stenosis   TONSILLECTOMY     as child age 8 or 5    There were no vitals filed for this visit.   Subjective Assessment - 09/13/21 1429     Subjective Pretty good    Currently in Pain? Yes    Pain Score 3     Pain Location Knee    Pain Orientation Left                               [facility] Adult PT Treatment/Exercise - 09/13/21 0001       Ambulation/Gait   Gait Comments ambulate no device CGA 220' x2      Lumbar Exercises: Aerobic   UBE (Upper Arm Bike) L 5 3 min fwd/3 min back,    Nustep L 6 8 min      Knee/Hip Exercises: Machines for Strengthening    Cybex Knee Extension 25# 2 sets 10    Cybex Knee Flexion 55# 2 sets 15      Knee/Hip Exercises: Seated   Sit to Sand 1 set;without UE support;5 reps   L knee pain                      PT Short Term Goals - 05/10/21 1505       PT SHORT TERM GOAL #1   Title give HEP that he will do at home    Status Achieved               PT Long Term Goals - 09/06/21 1523       PT LONG TERM GOAL #2   Title decrease TUG time to 18 seconds    Status Achieved      PT LONG TERM GOAL #3   Title walk with a cane or lessed AD x 500 feet    Status Partially Met  PT LONG TERM GOAL #4   Title get up from sitting without using hands    Status Partially Met                   Plan - 09/13/21 1510     Clinical Impression Statement Pt continues to show a steady progression to his PLOF. Pt reports able to get up at home and use the restroom independently. He also reports the ability to get in the shower alone but requires min assist from his wife to get out the shower.  All mobility at home is with RW. Pt reports ambulating with Cane prior to surgery.  Session consisted of gait without AD and strengthening.  Pt does display an improved tolerance with gait distance. Cue needed at times to control speed and to maintain an upright posture. During the end of second gait trial pt reports some increase tightness in the low back and pain in the posterior L knee decreasing his cadence.  Pt has an add on room at his house with three small steps but only 1 on R side when ascending. Pt has difficulty ascending stairs with one rail. He can descend with one rail on L but is very unstable.  Some increase L knee paid reported after machine level strengthening. Overall pt did have more knee than usual.  Pt is very motivated and give good effort during PT. Recommended continued skilled PT services so pt can return to his PLOF.    Rehab Potential Good    PT Frequency 2x / week    PT Duration 12  weeks    PT Treatment/Interventions ADLs/Self Care Home Management;Gait training;Neuromuscular re-education;Balance training;Therapeutic exercise;Therapeutic activities;Functional mobility training;Stair training;Patient/family education;Manual techniques    PT Next Visit Plan continue to try to push his strength and function, challenge him dynamically             Patient will benefit from skilled therapeutic intervention in order to improve the following deficits and impairments:  Abnormal gait, Decreased coordination, Decreased range of motion, Difficulty walking, Decreased endurance, Cardiopulmonary status limiting activity, Decreased activity tolerance, Decreased balance, Improper body mechanics, Postural dysfunction, Decreased strength, Decreased mobility  Visit Diagnosis: Difficulty in walking, not elsewhere classified  Muscle weakness (generalized)     Problem List Patient Active Problem List   Diagnosis Date Noted   Prostate cancer ([place]) 09/26/2012   DYSLIPIDEMIA 05/02/2007   MORBID OBESITY 05/02/2007   OBSTRUCTIVE SLEEP APNEA 05/02/2007   HYPERTENSION 05/02/2007    [name], PTA 09/13/2021, 3:11 PM  [place]. [place], [place], [address] Phone: [phone]   Fax:  [phone]  Name: Zerek Litsey MRN: [id number] Date of Birth: May 03, 1955

## 2021-09-15 ENCOUNTER — Encounter: Payer: Self-pay | Admitting: Physical Therapy

## 2021-09-15 ENCOUNTER — Ambulatory Visit: Payer: Medicare PPO | Admitting: Physical Therapy

## 2021-09-15 DIAGNOSIS — M6281 Muscle weakness (generalized): Secondary | ICD-10-CM | POA: Diagnosis not present

## 2021-09-15 DIAGNOSIS — R262 Difficulty in walking, not elsewhere classified: Secondary | ICD-10-CM

## 2021-09-15 NOTE — Therapy (Signed)
[place]. [place], [place], [address] Phone: [phone]   Fax:  [phone]  Physical Therapy Treatment  Patient Details  Name: Daniel [name] MRN: [id number] Date of Birth: Nov 25, 1955 Referring Provider (PT): Repass   Encounter Date: 09/15/2021   PT End of Session - 09/15/21 1400     Visit Number 59    Date for PT Re-Evaluation 11/24/21    Authorization Type [employer]    Authorization Time Period 1/20 [employer]    PT Start Time 1352    PT Stop Time 1450    PT Time Calculation (min) 58 min    Activity Tolerance Patient tolerated treatment well    Behavior During Therapy [facility] for tasks assessed/performed             Past Medical History:  Diagnosis Date   Arthritis    Diabetes mellitus without complication ([place])    Elevated PSA 08/29/2012   7.22   Hyperlipidemia    Hypertension    Obstructive sleep apnea    CPAP   Prostate cancer [facility]) Feb 2014   Spinal stenosis     Past Surgical History:  Procedure Laterality Date   KNEE SURGERY     torn meniscus   PILONIDAL CYST EXCISION     lower spine age 70   PROSTATE BIOPSY Bilateral 08/29/2012   gleason 3+3=6  Dr.Marc Nesi   RADIOACTIVE SEED IMPLANT N/A 11/19/2012   Procedure: RADIOACTIVE SEED IMPLANT;  Surgeon: Hanley Ben, MD;  [facility];  Service: Urology;  Laterality: N/A;   SPINAL CORD DECOMPRESSION     Spinal injection  2012   spinal stenosis   TONSILLECTOMY     as child age 72 or 5    There were no vitals filed for this visit.   Subjective Assessment - 09/15/21 1401     Subjective Patient reports a fall yesterday, wife had the carpets cleaned, the carpet was wet and he stepped on the door threshold and slipped and fell on his side, A little sore, had to call EMS to get up    Currently in Pain? Yes    Pain Score 5     Pain Location Knee    Pain Orientation Left    Aggravating Factors  sore from the fall                                [facility] Adult PT Treatment/Exercise - 09/15/21 0001       Ambulation/Gait   Gait Comments patient with a definite limp on the limp ont he left  and much slower than usual      Lumbar Exercises: Stretches   Passive Hamstring Stretch Left;4 reps;20 seconds      Lumbar Exercises: Aerobic   Elliptical unable to get on the elliptical due to weakness and pain in the left knee    UBE (Upper Arm Bike) L 5 3 min fwd/3 min back,, power burst at the end of each minute    Nustep L 6 8 min      Lumbar Exercises: Machines for Strengthening   Other Lumbar Machine Exercise 15# straight arm pulls      Lumbar Exercises: Seated   Long Arc Quad on Chair Left;2 sets;10 reps    LAQ on Chair Weights (lbs) 5    Other Seated Lumbar Exercises ball slams, ball catch and throw for core work on the [employer]  did 20# ball                       PT Short Term Goals - 05/10/21 1505       PT SHORT TERM GOAL #1   Title give HEP that he will do at home    Status Achieved               PT Long Term Goals - 09/06/21 1523       PT LONG TERM GOAL #2   Title decrease TUG time to 18 seconds    Status Achieved      PT LONG TERM GOAL #3   Title walk with a cane or lessed AD x 500 feet    Status Partially Met      PT LONG TERM GOAL #4   Title get up from sitting without using hands    Status Partially Met                   Plan - 09/15/21 1454     Clinical Impression Statement Patient had a fall yesterday with a wet carpet after a carpet cleaning, slipped on threshold.  He is much slower and with a significant limp on the left.  He was non tender, had slight decresed knee extension.  c/o tightness and some pain in the posterior knee.  He had some difficulty standing with the left knee going into extension    PT Next Visit Plan see if we can get him doing better again.    Consulted and Agree with Plan of Care Patient             Patient  will benefit from skilled therapeutic intervention in order to improve the following deficits and impairments:  Abnormal gait, Decreased coordination, Decreased range of motion, Difficulty walking, Decreased endurance, Cardiopulmonary status limiting activity, Decreased activity tolerance, Decreased balance, Improper body mechanics, Postural dysfunction, Decreased strength, Decreased mobility  Visit Diagnosis: Difficulty in walking, not elsewhere classified  Muscle weakness (generalized)     Problem List Patient Active Problem List   Diagnosis Date Noted   Prostate cancer ([place]) 09/26/2012   DYSLIPIDEMIA 05/02/2007   MORBID OBESITY 05/02/2007   OBSTRUCTIVE SLEEP APNEA 05/02/2007   HYPERTENSION 05/02/2007    Sumner Boast, PT 09/15/2021, 3:01 PM  [place]. [place], [place], [address] Phone: [phone]   Fax:  [phone]  Name: Daniel [name] MRN: [id number] Date of Birth: 09/20/1955

## 2021-09-20 ENCOUNTER — Encounter: Payer: Self-pay | Admitting: Physical Therapy

## 2021-09-20 ENCOUNTER — Ambulatory Visit: Payer: Medicare PPO | Admitting: Physical Therapy

## 2021-09-20 DIAGNOSIS — M6281 Muscle weakness (generalized): Secondary | ICD-10-CM

## 2021-09-20 DIAGNOSIS — R262 Difficulty in walking, not elsewhere classified: Secondary | ICD-10-CM | POA: Diagnosis not present

## 2021-09-20 NOTE — Therapy (Signed)
[place]. [place], [place], [address] Phone: [phone]   Fax:  [phone] Progress Note Reporting Period 08/04/21 to 09/20/21 for visits 51-60   See note below for Objective Data and Assessment of Progress/Goals.     Physical Therapy Treatment  Patient Details  Name: Daniel [name] MRN: [id number] Date of Birth: 1956-03-16 Referring Provider (PT): Repass   Encounter Date: 09/20/2021   PT End of Session - 09/20/21 1506     Visit Number 37    Date for PT Re-Evaluation 11/24/21    PT Start Time 4709    PT Stop Time 6283    PT Time Calculation (min) 58 min             Past Medical History:  Diagnosis Date   Arthritis    Diabetes mellitus without complication ([place])    Elevated PSA 08/29/2012   7.22   Hyperlipidemia    Hypertension    Obstructive sleep apnea    CPAP   Prostate cancer [facility]) Feb 2014   Spinal stenosis     Past Surgical History:  Procedure Laterality Date   KNEE SURGERY     torn meniscus   PILONIDAL CYST EXCISION     lower spine age 20   PROSTATE BIOPSY Bilateral 08/29/2012   gleason 3+3=6  Dr.Marc Nesi   RADIOACTIVE SEED IMPLANT N/A 11/19/2012   Procedure: RADIOACTIVE SEED IMPLANT;  Surgeon: Hanley Ben, MD;  [facility];  Service: Urology;  Laterality: N/A;   SPINAL CORD DECOMPRESSION     Spinal injection  2012   spinal stenosis   TONSILLECTOMY     as child age 66 or 5    There were no vitals filed for this visit.   Subjective Assessment - 09/20/21 1416     Subjective Feel good, everything feels good    Currently in Pain? No/denies                               [facility] Adult PT Treatment/Exercise - 09/20/21 0001       Ambulation/Gait   Gait Comments ambulate no device CGA 220' x2 then gait 172f      Lumbar Exercises: Stretches   Passive Hamstring Stretch Left;20 seconds;3 reps    Other Lumbar Stretch Exercise Trunk flex with Pball       Lumbar Exercises: Aerobic   UBE (Upper Arm Bike) L 5 3 min fwd/3 min back,, power burst at the end of each minute    Nustep L 6 8 min      Lumbar Exercises: Seated   Other Seated Lumbar Exercises ball slams blue ball 2x10      Knee/Hip Exercises: Machines for Strengthening   Cybex Knee Extension 25# 2 sets 10    Cybex Knee Flexion 55# 2 sets 15      Knee/Hip Exercises: Seated   Sit to Sand 2 sets;15 reps;without UE support;Other (comment)   holding ball                      PT Short Term Goals - 05/10/21 1505       PT SHORT TERM GOAL #1   Title give HEP that he will do at home    Status Achieved               PT Long Term Goals - 09/06/21 1523  PT LONG TERM GOAL #2   Title decrease TUG time to 18 seconds    Status Achieved      PT LONG TERM GOAL #3   Title walk with a cane or lessed AD x 500 feet    Status Partially Met      PT LONG TERM GOAL #4   Title get up from sitting without using hands    Status Partially Met                   Plan - 09/20/21 1508     Clinical Impression Statement Pt enters clinic feeling well with no pain. Pt  progress with gait tolerance ambulating additional gait trials. He does report increase tightness in low back and glutes when ambulating. Some weakness that could be to fatigue with sit to stands. Cues for full ROM needed with seated leg curls and extensions. Good core activation with ball slams.    Rehab Potential Good    PT Frequency 2x / week    PT Duration 12 weeks    PT Treatment/Interventions ADLs/Self Care Home Management;Gait training;Neuromuscular re-education;Balance training;Therapeutic exercise;Therapeutic activities;Functional mobility training;Stair training;Patient/family education;Manual techniques    PT Next Visit Plan Functional activities.             Patient will benefit from skilled therapeutic intervention in order to improve the following deficits and impairments:  Abnormal  gait, Decreased coordination, Decreased range of motion, Difficulty walking, Decreased endurance, Cardiopulmonary status limiting activity, Decreased activity tolerance, Decreased balance, Improper body mechanics, Postural dysfunction, Decreased strength, Decreased mobility  Visit Diagnosis: Difficulty in walking, not elsewhere classified  Muscle weakness (generalized)     Problem List Patient Active Problem List   Diagnosis Date Noted   Prostate cancer ([place]) 09/26/2012   DYSLIPIDEMIA 05/02/2007   MORBID OBESITY 05/02/2007   OBSTRUCTIVE SLEEP APNEA 05/02/2007   HYPERTENSION 05/02/2007    [name], PTA 09/20/2021, 3:13 PM  [place]. [place], [place], [address] Phone: [phone]   Fax:  [phone]  Name: Daniel [name] MRN: [id number] Date of Birth: 03-16-1956

## 2021-09-22 ENCOUNTER — Ambulatory Visit: Payer: Medicare PPO | Attending: *Deleted | Admitting: Physical Therapy

## 2021-09-22 DIAGNOSIS — R262 Difficulty in walking, not elsewhere classified: Secondary | ICD-10-CM | POA: Insufficient documentation

## 2021-09-22 DIAGNOSIS — M6281 Muscle weakness (generalized): Secondary | ICD-10-CM | POA: Diagnosis not present

## 2021-09-22 NOTE — Therapy (Signed)
[place]. [place], [place], [address] Phone: [phone]   Fax:  [phone]  Physical Therapy Treatment  Patient Details  Name: Daniel [name] MRN: [id number] Date of Birth: Aug 11, 1955 Referring Provider (PT): Repass   Encounter Date: 09/22/2021   PT End of Session - 09/22/21 1521     Visit Number 25    Date for PT Re-Evaluation 11/24/21    Authorization Type [employer]    PT Start Time 1430    PT Stop Time 6720    PT Time Calculation (min) 60 min             Past Medical History:  Diagnosis Date   Arthritis    Diabetes mellitus without complication ([place])    Elevated PSA 08/29/2012   7.22   Hyperlipidemia    Hypertension    Obstructive sleep apnea    CPAP   Prostate cancer [facility]) Feb 2014   Spinal stenosis     Past Surgical History:  Procedure Laterality Date   KNEE SURGERY     torn meniscus   PILONIDAL CYST EXCISION     lower spine age 22   PROSTATE BIOPSY Bilateral 08/29/2012   gleason 3+3=6  Dr.Marc Nesi   RADIOACTIVE SEED IMPLANT N/A 11/19/2012   Procedure: RADIOACTIVE SEED IMPLANT;  Surgeon: Hanley Ben, MD;  [facility];  Service: Urology;  Laterality: N/A;   SPINAL CORD DECOMPRESSION     Spinal injection  2012   spinal stenosis   TONSILLECTOMY     as child age 78 or 5    There were no vitals filed for this visit.   Subjective Assessment - 09/22/21 1430     Subjective doing pretty good today    Currently in Pain? No/denies                               [facility] Adult PT Treatment/Exercise - 09/22/21 0001       Lumbar Exercises: Aerobic   Elliptical L 4 5 min fwd    Nustep L 6 8 min      Lumbar Exercises: Machines for Strengthening   Leg Press 60# LLE only- seat # 13 then 2 sets at 12      Lumbar Exercises: Standing   Other Standing Lumbar Exercises step up 5 x HHA mod A   ext left knee and trys to lean fwd to trust up   Other Standing Lumbar  Exercises press downs 70# with UE support 15 x 2 sets      Lumbar Exercises: Seated   Long Arc Quad on Chair Strengthening;Left;2 sets;10 reps;Weights    LAQ on Chair Weights (lbs) 5    LAQ on Chair Limitations hold 5 sec                       PT Short Term Goals - 05/10/21 1505       PT SHORT TERM GOAL #1   Title give HEP that he will do at home    Status Achieved               PT Long Term Goals - 09/06/21 1523       PT LONG TERM GOAL #2   Title decrease TUG time to 18 seconds    Status Achieved      PT LONG TERM GOAL #3   Title walk with a cane  or lessed AD x 500 feet    Status Partially Met      PT LONG TERM GOAL #4   Title get up from sitting without using hands    Status Partially Met                   Plan - 09/22/21 1522     Clinical Impression Statement focus on quad ex and strengtheing esp with wt bearing which is very difficult for pt as he wants to lock knee and go into trunk flex and use momentum to get fwd with UE. weakness as well as some pain and decreased trust    PT Treatment/Interventions ADLs/Self Care Home Management;Gait training;Neuromuscular re-education;Balance training;Therapeutic exercise;Therapeutic activities;Functional mobility training;Stair training;Patient/family education;Manual techniques    PT Next Visit Plan step up with  focus on LLE correct muscle isolation             Patient will benefit from skilled therapeutic intervention in order to improve the following deficits and impairments:  Abnormal gait, Decreased coordination, Decreased range of motion, Difficulty walking, Decreased endurance, Cardiopulmonary status limiting activity, Decreased activity tolerance, Decreased balance, Improper body mechanics, Postural dysfunction, Decreased strength, Decreased mobility  Visit Diagnosis: Difficulty in walking, not elsewhere classified  Muscle weakness (generalized)     Problem List Patient Active  Problem List   Diagnosis Date Noted   Prostate cancer ([place]) 09/26/2012   DYSLIPIDEMIA 05/02/2007   MORBID OBESITY 05/02/2007   OBSTRUCTIVE SLEEP APNEA 05/02/2007   HYPERTENSION 05/02/2007    Kelvin Sennett,[name], PTA 09/22/2021, 3:25 PM  [place]. [place], [place], [address] Phone: [phone]   Fax:  [phone]  Name: Daniel [name] MRN: [id number] Date of Birth: [birth year]

## 2021-09-27 ENCOUNTER — Encounter: Payer: Self-pay | Admitting: Physical Therapy

## 2021-09-27 ENCOUNTER — Ambulatory Visit: Payer: Medicare PPO | Admitting: Physical Therapy

## 2021-09-27 DIAGNOSIS — R262 Difficulty in walking, not elsewhere classified: Secondary | ICD-10-CM

## 2021-09-27 DIAGNOSIS — M6281 Muscle weakness (generalized): Secondary | ICD-10-CM

## 2021-09-27 NOTE — Therapy (Signed)
[facility] [address] [place], [place], [address] Phone: [phone]   Fax:  [phone]  Physical Therapy Treatment  Patient Details  Name: Daniel [name] MRN: [id number] Date of Birth: 12/31/1955 Referring Provider (PT): Repass   Encounter Date: 09/27/2021   PT End of Session - 09/27/21 1510     Visit Number 62    Date for PT Re-Evaluation 11/24/21    PT Start Time 1215    PT Stop Time 1311    PT Time Calculation (min) 56 min    Activity Tolerance Patient tolerated treatment well    Behavior During Therapy [facility] for tasks assessed/performed             Past Medical History:  Diagnosis Date   Arthritis    Diabetes mellitus without complication ([facility])    Elevated PSA 08/29/2012   7.22   Hyperlipidemia    Hypertension    Obstructive sleep apnea    CPAP   Prostate cancer [facility]) Feb 2014   Spinal stenosis     Past Surgical History:  Procedure Laterality Date   KNEE SURGERY     torn meniscus   PILONIDAL CYST EXCISION     lower spine age 77   PROSTATE BIOPSY Bilateral 08/29/2012   gleason 3+3=6  Dr.Marc Nesi   RADIOACTIVE SEED IMPLANT N/A 11/19/2012   Procedure: RADIOACTIVE SEED IMPLANT;  Surgeon: Lindaann Slough, MD;  [facility];  Service: Urology;  Laterality: N/A;   SPINAL CORD DECOMPRESSION     Spinal injection  2012   spinal stenosis   TONSILLECTOMY     as child age 57 or 5    There were no vitals filed for this visit.   Subjective Assessment - 09/27/21 1421     Subjective Feeling pretty good L knee is popping but not hurting.    Currently in Pain? No/denies                               [facility] Adult PT Treatment/Exercise - 09/27/21 0001       Ambulation/Gait   Ambulation/Gait Yes    Ambulation Distance (Feet) 125 Feet    Assistive device None    Gait Pattern Step-through pattern;Decreased stride length    Stairs Yes   Weak LLE   Stairs Assistance 5: Supervision;4: Min  guard    Stair Management Technique Two rails;Step to pattern    Number of Stairs 9    Height of Stairs 4    Gait Comments ambulate no device CGA 189ft      Lumbar Exercises: Aerobic   Elliptical --    UBE (Upper Arm Bike) L 5 3 min fwd/3 min back,, power burst at the end of each minute    Nustep L 6 8 min      Lumbar Exercises: Machines for Strengthening   Cybex Knee Extension LLE 15lb 3x10    Cybex Knee Flexion LLE 25lb 3x10    Leg Press 100lb 2x10 L8, SL 50lb LLE x10      Lumbar Exercises: Seated   Other Seated Lumbar Exercises ball slams blue ball 2x10   SS with sit to stands holding blue ball x10                      PT Short Term Goals - 05/10/21 1505       PT SHORT TERM GOAL #1  Title give HEP that he will do at home    Status Achieved               PT Long Term Goals - 09/06/21 1523       PT LONG TERM GOAL #2   Title decrease TUG time to 18 seconds    Status Achieved      PT LONG TERM GOAL #3   Title walk with a cane or lessed AD x 500 feet    Status Partially Met      PT LONG TERM GOAL #4   Title get up from sitting without using hands    Status Partially Met                   Plan - 09/27/21 1511     Clinical Impression Statement Continues focus on L quat strengthening. LLE is functionally weak with step ups. Pt is unable to step up with LLE without heavy UE assist. Pt also continues to last trust with LLE. Pt did demo a more fluid gait pattern when ambulating.    Rehab Potential Good    PT Frequency 2x / week    PT Duration 12 weeks    PT Treatment/Interventions ADLs/Self Care Home Management;Gait training;Neuromuscular re-education;Balance training;Therapeutic exercise;Therapeutic activities;Functional mobility training;Stair training;Patient/family education;Manual techniques    PT Next Visit Plan step up with  focus on LLE correct muscle isolation             Patient will benefit from skilled therapeutic  intervention in order to improve the following deficits and impairments:  Abnormal gait, Decreased coordination, Decreased range of motion, Difficulty walking, Decreased endurance, Cardiopulmonary status limiting activity, Decreased activity tolerance, Decreased balance, Improper body mechanics, Postural dysfunction, Decreased strength, Decreased mobility  Visit Diagnosis: Difficulty in walking, not elsewhere classified  Muscle weakness (generalized)     Problem List Patient Active Problem List   Diagnosis Date Noted   Prostate cancer ([place]) 09/26/2012   DYSLIPIDEMIA 05/02/2007   MORBID OBESITY 05/02/2007   OBSTRUCTIVE SLEEP APNEA 05/02/2007   HYPERTENSION 05/02/2007    [name], PTA 09/27/2021, 3:14 PM  [place]. [place], [place], [address] Phone: [phone]   Fax:  [phone]  Name: Daniel [name] MRN: [id number] Date of Birth: Jan 14, 1956

## 2021-09-29 ENCOUNTER — Encounter: Payer: Self-pay | Admitting: Physical Therapy

## 2021-09-29 ENCOUNTER — Ambulatory Visit: Payer: Medicare PPO | Admitting: Physical Therapy

## 2021-09-29 DIAGNOSIS — R262 Difficulty in walking, not elsewhere classified: Secondary | ICD-10-CM

## 2021-09-29 DIAGNOSIS — M6281 Muscle weakness (generalized): Secondary | ICD-10-CM | POA: Diagnosis not present

## 2021-09-29 NOTE — Therapy (Signed)
[place]. [place], [place], [address] Phone: [phone]   Fax:  [phone]  Physical Therapy Treatment  Patient Details  Name: Daniel [name] MRN: [id number] Date of Birth: 1956/04/23 Referring Provider (PT): Repass   Encounter Date: 09/29/2021   PT End of Session - 09/29/21 1513     Visit Number 67    Date for PT Re-Evaluation 11/24/21    Authorization Type [employer]    PT Start Time 1771    PT Stop Time 1513    PT Time Calculation (min) 58 min    Activity Tolerance Patient tolerated treatment well    Behavior During Therapy [facility] for tasks assessed/performed             Past Medical History:  Diagnosis Date   Arthritis    Diabetes mellitus without complication ([place])    Elevated PSA 08/29/2012   7.22   Hyperlipidemia    Hypertension    Obstructive sleep apnea    CPAP   Prostate cancer [facility]) Feb 2014   Spinal stenosis     Past Surgical History:  Procedure Laterality Date   KNEE SURGERY     torn meniscus   PILONIDAL CYST EXCISION     lower spine age 83   PROSTATE BIOPSY Bilateral 08/29/2012   gleason 3+3=6  Dr.Marc Nesi   RADIOACTIVE SEED IMPLANT N/A 11/19/2012   Procedure: RADIOACTIVE SEED IMPLANT;  Surgeon: Hanley Ben, MD;  [facility]: [facility];  Service: Urology;  Laterality: N/A;   SPINAL CORD DECOMPRESSION     Spinal injection  2012   spinal stenosis   TONSILLECTOMY     as child age 83 or 5    There were no vitals filed for this visit.   Subjective Assessment - 09/29/21 1432     Subjective Doing ok, L knee still popping but doing ok    Currently in Pain? No/denies                               [facility] Adult PT Treatment/Exercise - 09/29/21 0001       Ambulation/Gait   Ambulation/Gait Yes    Ambulation Distance (Feet) 125 Feet    Assistive device None    Gait Pattern Step-through pattern;Decreased stride length    Gait Comments ambulate no device CGA 11f       High Level Balance   High Level Balance Comments Side step      Lumbar Exercises: Aerobic   UBE (Upper Arm Bike) L 5 3 min fwd/3 min back,, power burst at the end of each minute    Nustep L 6 8 min      Lumbar Exercises: Machines for Strengthening   Leg Press 50lb LLE 3x10      Lumbar Exercises: Seated   Other Seated Lumbar Exercises ball slams blue ball 2x10    Other Seated Lumbar Exercises OHP blue ball      Knee/Hip Exercises: Standing   Forward Step Up Left;1 set;10 reps;Step Height: 4"   RW   Other Standing Knee Exercises At 8in box taps RW 2x10                       PT Short Term Goals - 05/10/21 1505       PT SHORT TERM GOAL #1   Title give HEP that he will do at home    Status Achieved  PT Long Term Goals - 09/06/21 1523       PT LONG TERM GOAL #2   Title decrease TUG time to 18 seconds    Status Achieved      PT LONG TERM GOAL #3   Title walk with a cane or lessed AD x 500 feet    Status Partially Met      PT LONG TERM GOAL #4   Title get up from sitting without using hands    Status Partially Met                   Plan - 09/29/21 1513     Clinical Impression Statement Pt enters feeling fine, but does report some L knee popping. Pt continues to requires heavy UE use when stepping up with LLE. Pt reports a strain sensation in LLE when asked to use it to step up. Gait pattern remains more fluid as well as alt box taps. S2S and ball slams performed as superset for more cardiovascular work.    Rehab Potential Good    PT Duration 12 weeks    PT Treatment/Interventions ADLs/Self Care Home Management;Gait training;Neuromuscular re-education;Balance training;Therapeutic exercise;Therapeutic activities;Functional mobility training;Stair training;Patient/family education;Manual techniques    PT Next Visit Plan step up with  focus on LLE correct muscle isolation             Patient will benefit from skilled  therapeutic intervention in order to improve the following deficits and impairments:  Abnormal gait, Decreased coordination, Decreased range of motion, Difficulty walking, Decreased endurance, Cardiopulmonary status limiting activity, Decreased activity tolerance, Decreased balance, Improper body mechanics, Postural dysfunction, Decreased strength, Decreased mobility  Visit Diagnosis: Muscle weakness (generalized)  Difficulty in walking, not elsewhere classified     Problem List Patient Active Problem List   Diagnosis Date Noted   Prostate cancer ([place]) 09/26/2012   DYSLIPIDEMIA 05/02/2007   MORBID OBESITY 05/02/2007   OBSTRUCTIVE SLEEP APNEA 05/02/2007   HYPERTENSION 05/02/2007    [name], PTA 09/29/2021, 3:18 PM  [place]. [place], [place], [address] Phone: [phone]   Fax:  [phone]  Name: Daniel [name] MRN: [id number] Date of Birth: [birth year]

## 2021-10-04 ENCOUNTER — Encounter: Payer: Self-pay | Admitting: Physical Therapy

## 2021-10-04 ENCOUNTER — Ambulatory Visit: Payer: Medicare PPO | Admitting: Physical Therapy

## 2021-10-04 DIAGNOSIS — M6281 Muscle weakness (generalized): Secondary | ICD-10-CM | POA: Diagnosis not present

## 2021-10-04 DIAGNOSIS — R262 Difficulty in walking, not elsewhere classified: Secondary | ICD-10-CM

## 2021-10-04 NOTE — Therapy (Signed)
[place]. [place], [place], [address] Phone: [phone]   Fax:  [phone]  Physical Therapy Treatment  Patient Details  Name: Floyed Masoud MRN: [id number] Date of Birth: [birth year] Referring Provider (PT): Repass   Encounter Date: 10/04/2021   PT End of Session - 10/04/21 1435     Visit Number 59    Date for PT Re-Evaluation 11/24/21    Authorization Type [employer]    Authorization Time Period 6/20/ [employer]    PT Start Time 2800    PT Stop Time 1523    PT Time Calculation (min) 55 min    Activity Tolerance Patient tolerated treatment well    Behavior During Therapy [facility] for tasks assessed/performed             Past Medical History:  Diagnosis Date   Arthritis    Diabetes mellitus without complication ([place])    Elevated PSA 08/29/2012   7.22   Hyperlipidemia    Hypertension    Obstructive sleep apnea    CPAP   Prostate cancer [facility]) Feb 2014   Spinal stenosis     Past Surgical History:  Procedure Laterality Date   KNEE SURGERY     torn meniscus   PILONIDAL CYST EXCISION     lower spine age 61   PROSTATE BIOPSY Bilateral 08/29/2012   gleason 3+3=6  Dr.Marc Nesi   RADIOACTIVE SEED IMPLANT N/A 11/19/2012   Procedure: RADIOACTIVE SEED IMPLANT;  Surgeon: Hanley Ben, MD;  [facility];  Service: Urology;  Laterality: N/A;   SPINAL CORD DECOMPRESSION     Spinal injection  2012   spinal stenosis   TONSILLECTOMY     as child age 54 or 5    There were no vitals filed for this visit.   Subjective Assessment - 10/04/21 1438     Subjective Doing okay, no falls    Currently in Pain? No/denies                               [facility] Adult PT Treatment/Exercise - 10/04/21 0001       Lumbar Exercises: Aerobic   Elliptical level 5 x 5.5 minutes    UBE (Upper Arm Bike) L 5 3 min fwd/3 min back,, power burst at the end of each minute      Lumbar Exercises: Machines for  Strengthening   Leg Press 50lb LLE 3x10    Other Lumbar Machine Exercise sit to stand with arms pulling up, and right leg on airex and sit fit to make him use the left leg      Lumbar Exercises: Seated   Other Seated Lumbar Exercises partial situps with 20# and then 20# overhead press      Knee/Hip Exercises: Standing   Other Standing Knee Exercises in pbars 2" and 4" step ups                       PT Short Term Goals - 05/10/21 1505       PT SHORT TERM GOAL #1   Title give HEP that he will do at home    Status Achieved               PT Long Term Goals - 09/06/21 1523       PT LONG TERM GOAL #2   Title decrease TUG time to 18 seconds  Status Achieved      PT LONG TERM GOAL #3   Title walk with a cane or lessed AD x 500 feet    Status Partially Met      PT LONG TERM GOAL #4   Title get up from sitting without using hands    Status Partially Met                   Plan - 10/04/21 1520     Clinical Impression Statement I really focused on getting the left quad to work.  This really fatigued him.  He was able to go longer on the Elliptical.  He worked really hard, no significant pain, just really fatigued by the end    PT Next Visit Plan step up with  focus on LLE correct muscle isolation    Consulted and Agree with Plan of Care Patient             Patient will benefit from skilled therapeutic intervention in order to improve the following deficits and impairments:  Abnormal gait, Decreased coordination, Decreased range of motion, Difficulty walking, Decreased endurance, Cardiopulmonary status limiting activity, Decreased activity tolerance, Decreased balance, Improper body mechanics, Postural dysfunction, Decreased strength, Decreased mobility  Visit Diagnosis: Muscle weakness (generalized)  Difficulty in walking, not elsewhere classified     Problem List Patient Active Problem List   Diagnosis Date Noted   Prostate cancer ([place])  09/26/2012   DYSLIPIDEMIA 05/02/2007   MORBID OBESITY 05/02/2007   OBSTRUCTIVE SLEEP APNEA 05/02/2007   HYPERTENSION 05/02/2007    Sumner Boast, PT 10/04/2021, 3:23 PM  [place]. [place], [place], [address] Phone: [phone]   Fax:  [phone]  Name: Kendyn Zaman MRN: [id number] Date of Birth: [birth year]

## 2021-10-06 ENCOUNTER — Encounter: Payer: Self-pay | Admitting: Physical Therapy

## 2021-10-06 ENCOUNTER — Ambulatory Visit: Payer: Medicare PPO | Admitting: Physical Therapy

## 2021-10-06 DIAGNOSIS — M6281 Muscle weakness (generalized): Secondary | ICD-10-CM | POA: Diagnosis not present

## 2021-10-06 DIAGNOSIS — R262 Difficulty in walking, not elsewhere classified: Secondary | ICD-10-CM

## 2021-10-06 NOTE — Therapy (Signed)
[place]. [place], [place], [address] Phone: [phone]   Fax:  [phone]  Physical Therapy Treatment  Patient Details  Name: Aydrien Froman MRN: [id number] Date of Birth: 04/23/1956 Referring Provider (PT): Repass   Encounter Date: 10/06/2021   PT End of Session - 10/06/21 1510     Visit Number 70    Date for PT Re-Evaluation 11/24/21    Authorization Type [employer]    PT Start Time 6073    PT Stop Time 1510    PT Time Calculation (min) 55 min    Activity Tolerance Patient tolerated treatment well    Behavior During Therapy [facility] for tasks assessed/performed             Past Medical History:  Diagnosis Date   Arthritis    Diabetes mellitus without complication ([place])    Elevated PSA 08/29/2012   7.22   Hyperlipidemia    Hypertension    Obstructive sleep apnea    CPAP   Prostate cancer [facility]) Feb 2014   Spinal stenosis     Past Surgical History:  Procedure Laterality Date   KNEE SURGERY     torn meniscus   PILONIDAL CYST EXCISION     lower spine age 22   PROSTATE BIOPSY Bilateral 08/29/2012   gleason 3+3=6  Dr.Marc Nesi   RADIOACTIVE SEED IMPLANT N/A 11/19/2012   Procedure: RADIOACTIVE SEED IMPLANT;  Surgeon: Hanley Ben, MD;  [facility];  Service: Urology;  Laterality: N/A;   SPINAL CORD DECOMPRESSION     Spinal injection  2012   spinal stenosis   TONSILLECTOMY     as child age 51 or 5    There were no vitals filed for this visit.   Subjective Assessment - 10/06/21 1415     Subjective "Pretty good"    Currently in Pain? No/denies                               [facility] Adult PT Treatment/Exercise - 10/06/21 0001       High Level Balance   High Level Balance Comments Side step   at mat table     Lumbar Exercises: Aerobic   Elliptical level 5 x 5.5 minutes    UBE (Upper Arm Bike) L 5 3 min fwd/3 min back,, power burst at the end of each minute      Lumbar  Exercises: Machines for Strengthening   Cybex Knee Extension LLE 15lb 3x10    Cybex Knee Flexion LLE 25lb 3x12    Leg Press 50lb LLE 3x10      Lumbar Exercises: Seated   Other Seated Lumbar Exercises S2S OHP wihg slam blue ball 2x10                       PT Short Term Goals - 05/10/21 1505       PT SHORT TERM GOAL #1   Title give HEP that he will do at home    Status Achieved               PT Long Term Goals - 09/06/21 1523       PT LONG TERM GOAL #2   Title decrease TUG time to 18 seconds    Status Achieved      PT LONG TERM GOAL #3   Title walk with a cane or lessed AD x 500  feet    Status Partially Met      PT LONG TERM GOAL #4   Title get up from sitting without using hands    Status Partially Met                   Plan - 10/06/21 1510     Clinical Impression Statement Pt enters feeling well. Started session on elliptical. Pt able to increase time on elliptical but with increase fatigue. Continued with L  quad strengthening with machine level interventions. Good effort given through session. Fatigue reported  post session    Rehab Potential Good    PT Frequency 2x / week    PT Duration 12 weeks    PT Treatment/Interventions ADLs/Self Care Home Management;Gait training;Neuromuscular re-education;Balance training;Therapeutic exercise;Therapeutic activities;Functional mobility training;Stair training;Patient/family education;Manual techniques    PT Next Visit Plan step up with  focus on LLE correct muscle isolation             Patient will benefit from skilled therapeutic intervention in order to improve the following deficits and impairments:  Abnormal gait, Decreased coordination, Decreased range of motion, Difficulty walking, Decreased endurance, Cardiopulmonary status limiting activity, Decreased activity tolerance, Decreased balance, Improper body mechanics, Postural dysfunction, Decreased strength, Decreased mobility  Visit  Diagnosis: Difficulty in walking, not elsewhere classified  Muscle weakness (generalized)     Problem List Patient Active Problem List   Diagnosis Date Noted   Prostate cancer ([place]) 09/26/2012   DYSLIPIDEMIA 05/02/2007   MORBID OBESITY 05/02/2007   OBSTRUCTIVE SLEEP APNEA 05/02/2007   HYPERTENSION 05/02/2007    [name], PTA 10/06/2021, 3:13 PM  [place]. [place], [place], [address] Phone: [phone]   Fax:  [phone]  Name: Jlynn Langille MRN: [id number] Date of Birth: 08-23-1955

## 2021-10-11 ENCOUNTER — Encounter: Payer: Self-pay | Admitting: Physical Therapy

## 2021-10-11 ENCOUNTER — Ambulatory Visit: Payer: Medicare PPO | Admitting: Physical Therapy

## 2021-10-11 DIAGNOSIS — R262 Difficulty in walking, not elsewhere classified: Secondary | ICD-10-CM | POA: Diagnosis not present

## 2021-10-11 DIAGNOSIS — M6281 Muscle weakness (generalized): Secondary | ICD-10-CM | POA: Diagnosis not present

## 2021-10-11 NOTE — Therapy (Signed)
[place]. [place], [place], [address] Phone: [phone]   Fax:  [phone]  Physical Therapy Treatment  Patient Details  Name: Daniel [name] MRN: [id number] Date of Birth: 11-04-1955 Referring Provider (PT): Repass   Encounter Date: 10/11/2021   PT End of Session - 10/11/21 1511     Visit Number 14    Date for PT Re-Evaluation 11/24/21    PT Start Time 1415    PT Stop Time 1511    PT Time Calculation (min) 56 min    Activity Tolerance Patient tolerated treatment well    Behavior During Therapy [facility] for tasks assessed/performed             Past Medical History:  Diagnosis Date   Arthritis    Diabetes mellitus without complication ([place])    Elevated PSA 08/29/2012   7.22   Hyperlipidemia    Hypertension    Obstructive sleep apnea    CPAP   Prostate cancer [facility]) Feb 2014   Spinal stenosis     Past Surgical History:  Procedure Laterality Date   KNEE SURGERY     torn meniscus   PILONIDAL CYST EXCISION     lower spine age 39   PROSTATE BIOPSY Bilateral 08/29/2012   gleason 3+3=6  Dr.Marc Nesi   RADIOACTIVE SEED IMPLANT N/A 11/19/2012   Procedure: RADIOACTIVE SEED IMPLANT;  Surgeon: Hanley Ben, MD;  [facility];  Service: Urology;  Laterality: N/A;   SPINAL CORD DECOMPRESSION     Spinal injection  2012   spinal stenosis   TONSILLECTOMY     as child age 73 or 5    There were no vitals filed for this visit.   Subjective Assessment - 10/11/21 1427     Subjective knees were hurting all weekend after last session,    Currently in Pain? Yes    Pain Score 4     Pain Location Knee    Pain Orientation Right    Pain Descriptors / Indicators Tender                               [facility] Adult PT Treatment/Exercise - 10/11/21 0001       Ambulation/Gait   Ambulation/Gait Yes    Ambulation Distance (Feet) 125 Feet    Assistive device None    Gait Pattern Step-through  pattern;Decreased stride length    Stairs Yes    Stairs Assistance 5: Supervision;4: Min guard    Stair Management Technique Two rails;Step to pattern;Alternating pattern    Number of Stairs 9    Height of Stairs 4    Gait Comments ambulate no device CGA 134ft x2      High Level Balance   High Level Balance Comments Side step   at mat table     Lumbar Exercises: Aerobic   UBE (Upper Arm Bike) L 5 3 min fwd/3 min back,, power burst at the end of each minute    Nustep L 6 8 min      Lumbar Exercises: Standing   Row Strengthening;Both;15 reps;Theraband   x2   Theraband Level (Row) Other (comment)   black   Shoulder Extension Strengthening;15 reps;Theraband   x2   Theraband Level (Shoulder Extension) Other (comment)   black     Lumbar Exercises: Seated   Other Seated Lumbar Exercises S2S OHP blue ball 2x10      Knee/Hip Exercises:  Standing   Other Standing Knee Exercises standing marches 2x10                       PT Short Term Goals - 05/10/21 1505       PT SHORT TERM GOAL #1   Title give HEP that he will do at home    Status Achieved               PT Long Term Goals - 10/11/21 1511       PT LONG TERM GOAL #1   Title independent with an advanced HEP    Status Achieved      PT LONG TERM GOAL #2   Title decrease TUG time to 18 seconds    Status Achieved      PT LONG TERM GOAL #3   Title walk with a cane or lessed AD x 500 feet    Status Partially Met      PT LONG TERM GOAL #4   Title get up from sitting without using hands    Status Partially Met                   Plan - 10/11/21 1512     Clinical Impression Statement Pt arrives doing well. He did report some R knee pain over the weekend. He thinks it may have come from starting on elliptical. Session focused on functional strength and endurance. Some increase in HS and glute tightness with gait. Min assist at times to maintain balance with sit to stands needed. Pt able to ascend  stairs with alternating pattern, step to pattern uses when descending. Pt stated he did not trust LLE descending stairs.    Rehab Potential Good    PT Frequency 2x / week    PT Duration 12 weeks    PT Treatment/Interventions ADLs/Self Care Home Management;Gait training;Neuromuscular re-education;Balance training;Therapeutic exercise;Therapeutic activities;Functional mobility training;Stair training;Patient/family education;Manual techniques    PT Next Visit Plan step up with  focus on LLE correct muscle isolation             Patient will benefit from skilled therapeutic intervention in order to improve the following deficits and impairments:  Abnormal gait, Decreased coordination, Decreased range of motion, Difficulty walking, Decreased endurance, Cardiopulmonary status limiting activity, Decreased activity tolerance, Decreased balance, Improper body mechanics, Postural dysfunction, Decreased strength, Decreased mobility  Visit Diagnosis: Difficulty in walking, not elsewhere classified  Muscle weakness (generalized)     Problem List Patient Active Problem List   Diagnosis Date Noted   Prostate cancer ([place]) 09/26/2012   DYSLIPIDEMIA 05/02/2007   MORBID OBESITY 05/02/2007   OBSTRUCTIVE SLEEP APNEA 05/02/2007   HYPERTENSION 05/02/2007    [name], PTA 10/11/2021, 3:15 PM  [place]. [place], [place], [address] Phone: [phone]   Fax:  [phone]  Name: Daniel [name] MRN: [id number] Date of Birth: 02-19-1956

## 2021-10-13 ENCOUNTER — Ambulatory Visit: Payer: Medicare PPO | Admitting: Physical Therapy

## 2021-10-13 ENCOUNTER — Encounter: Payer: Self-pay | Admitting: Physical Therapy

## 2021-10-13 DIAGNOSIS — R262 Difficulty in walking, not elsewhere classified: Secondary | ICD-10-CM

## 2021-10-13 DIAGNOSIS — M6281 Muscle weakness (generalized): Secondary | ICD-10-CM

## 2021-10-13 NOTE — Therapy (Signed)
[place]. [place], [place], [address] Phone: [phone]   Fax:  [phone]  Physical Therapy Treatment  Patient Details  Name: Daniel [name] MRN: [id number] Date of Birth: 1955/07/19 Referring Provider (PT): Repass   Encounter Date: 10/13/2021   PT End of Session - 10/13/21 7858     Visit Number 37    Date for PT Re-Evaluation 11/24/21    PT Start Time 1415    PT Stop Time 1513    PT Time Calculation (min) 58 min    Activity Tolerance Patient tolerated treatment well    Behavior During Therapy [facility] for tasks assessed/performed             Past Medical History:  Diagnosis Date   Arthritis    Diabetes mellitus without complication ([place])    Elevated PSA 08/29/2012   7.22   Hyperlipidemia    Hypertension    Obstructive sleep apnea    CPAP   Prostate cancer [facility]) Feb 2014   Spinal stenosis     Past Surgical History:  Procedure Laterality Date   KNEE SURGERY     torn meniscus   PILONIDAL CYST EXCISION     lower spine age 26   PROSTATE BIOPSY Bilateral 08/29/2012   gleason 3+3=6  Dr.Marc Nesi   RADIOACTIVE SEED IMPLANT N/A 11/19/2012   Procedure: RADIOACTIVE SEED IMPLANT;  Surgeon: Hanley Ben, MD;  [facility];  Service: Urology;  Laterality: N/A;   SPINAL CORD DECOMPRESSION     Spinal injection  2012   spinal stenosis   TONSILLECTOMY     as child age 23 or 5    There were no vitals filed for this visit.   Subjective Assessment - 10/13/21 1430     Subjective Feeling pretty good    Currently in Pain? No/denies                               [facility] Adult PT Treatment/Exercise - 10/13/21 0001       Ambulation/Gait   Ambulation/Gait Yes    Ambulation Distance (Feet) 150 Feet    Assistive device None    Gait Pattern Step-through pattern;Decreased stride length    Ambulation Surface Indoor    Stairs Assistance 5: Supervision;4: Min guard    Stair Management  Technique Two rails;Step to pattern;Alternating pattern    Number of Stairs 15    Height of Stairs 4      High Level Balance   High Level Balance Activities Backward walking      Lumbar Exercises: Stretches   Active Hamstring Stretch 10 seconds;2 reps;Right;Left    Other Lumbar Stretch Exercise lumbar stretch with ball x 4      Lumbar Exercises: Aerobic   UBE (Upper Arm Bike) L 5 3 min fwd/3 min back,, power burst at the end of each minute    Nustep L 6 8 min      Lumbar Exercises: Machines for Strengthening   Cybex Knee Extension 25lb 2x10    Cybex Knee Flexion 50lb 2x12      Lumbar Exercises: Seated   Sit to Stand 20 reps   with 20#     Knee/Hip Exercises: Standing   Other Standing Knee Exercises standing marches 2x10      Knee/Hip Exercises: Seated   Marching 2 sets;10 reps;Right;Left  PT Short Term Goals - 05/10/21 1505       PT SHORT TERM GOAL #1   Title give HEP that he will do at home    Status Achieved               PT Long Term Goals - 10/11/21 1511       PT LONG TERM GOAL #1   Title independent with an advanced HEP    Status Achieved      PT LONG TERM GOAL #2   Title decrease TUG time to 18 seconds    Status Achieved      PT LONG TERM GOAL #3   Title walk with a cane or lessed AD x 500 feet    Status Partially Met      PT LONG TERM GOAL #4   Title get up from sitting without using hands    Status Partially Met                   Plan - 10/13/21 1517     Clinical Impression Statement Heavy focus spent on stair negotiation working on functional strength in LLE. He continues to need heave UE use utilizing LLE to ascend and descend stairs. Pt did demo some righting reactions with standing marches when he became unstable. Some LE fatigue evident by decrease ROM in the latter reps with hamstring curls and leg extensions.    Rehab Potential Good    PT Frequency 2x / week    PT Duration 12 weeks    PT  Treatment/Interventions ADLs/Self Care Home Management;Gait training;Neuromuscular re-education;Balance training;Therapeutic exercise;Therapeutic activities;Functional mobility training;Stair training;Patient/family education;Manual techniques    PT Next Visit Plan step up with  focus on LLE correct muscle isolation             Patient will benefit from skilled therapeutic intervention in order to improve the following deficits and impairments:  Abnormal gait, Decreased coordination, Decreased range of motion, Difficulty walking, Decreased endurance, Cardiopulmonary status limiting activity, Decreased activity tolerance, Decreased balance, Improper body mechanics, Postural dysfunction, Decreased strength, Decreased mobility  Visit Diagnosis: Difficulty in walking, not elsewhere classified  Muscle weakness (generalized)     Problem List Patient Active Problem List   Diagnosis Date Noted   Prostate cancer ([place]) 09/26/2012   DYSLIPIDEMIA 05/02/2007   MORBID OBESITY 05/02/2007   OBSTRUCTIVE SLEEP APNEA 05/02/2007   HYPERTENSION 05/02/2007    [name], PTA 10/13/2021, 3:27 PM  [place]. [place], [place], [address] Phone: [phone]   Fax:  [phone]  Name: Daniel [name] MRN: [id number] Date of Birth: [birth year]

## 2021-10-18 ENCOUNTER — Ambulatory Visit: Payer: Medicare PPO | Admitting: Physical Therapy

## 2021-10-18 ENCOUNTER — Encounter: Payer: Self-pay | Admitting: Physical Therapy

## 2021-10-18 DIAGNOSIS — R262 Difficulty in walking, not elsewhere classified: Secondary | ICD-10-CM

## 2021-10-18 DIAGNOSIS — M6281 Muscle weakness (generalized): Secondary | ICD-10-CM | POA: Diagnosis not present

## 2021-10-20 ENCOUNTER — Encounter: Payer: Self-pay | Admitting: Physical Therapy

## 2021-10-20 ENCOUNTER — Ambulatory Visit: Payer: Medicare PPO | Admitting: Physical Therapy

## 2021-10-20 DIAGNOSIS — M6281 Muscle weakness (generalized): Secondary | ICD-10-CM | POA: Diagnosis not present

## 2021-10-20 DIAGNOSIS — R262 Difficulty in walking, not elsewhere classified: Secondary | ICD-10-CM

## 2021-10-20 NOTE — Therapy (Signed)
[place] [place], [place], [address] Phone: [phone]   Fax:  [phone]  Physical Therapy Treatment  Patient Details  Name: Daniel [name] MRN: [id number] Date of Birth: Sep 28, 1955 Referring Provider (PT): Repass   Encounter Date: 10/20/2021   PT End of Session - 10/20/21 1448     Visit Number 81    Date for PT Re-Evaluation 11/24/21    Authorization Type [employer]    Authorization Time Period 11/20 [employer]    PT Start Time 1442    PT Stop Time 1530    PT Time Calculation (min) 48 min    Activity Tolerance Patient tolerated treatment well    Behavior During Therapy [facility] for tasks assessed/performed             Past Medical History:  Diagnosis Date   Arthritis    Diabetes mellitus without complication ([place])    Elevated PSA 08/29/2012   7.22   Hyperlipidemia    Hypertension    Obstructive sleep apnea    CPAP   Prostate cancer [facility]) Feb 2014   Spinal stenosis     Past Surgical History:  Procedure Laterality Date   KNEE SURGERY     torn meniscus   PILONIDAL CYST EXCISION     lower spine age 27   PROSTATE BIOPSY Bilateral 08/29/2012   gleason 3+3=6  Dr.Marc Nesi   RADIOACTIVE SEED IMPLANT N/A 11/19/2012   Procedure: RADIOACTIVE SEED IMPLANT;  Surgeon: Hanley Ben, MD;  [facility]: [facility];  Service: Urology;  Laterality: N/A;   SPINAL CORD DECOMPRESSION     Spinal injection  2012   spinal stenosis   TONSILLECTOMY     as child age 40 or 5    There were no vitals filed for this visit.   Subjective Assessment - 10/20/21 1448     Subjective My knees are tender, I don't think I can do the elliptical today    Currently in Pain? Yes    Pain Score 4     Pain Location Knee    Pain Orientation Right;Left;Anterior    Aggravating Factors  elliptical                               [facility] Adult PT Treatment/Exercise - 10/20/21 0001       Lumbar Exercises: Aerobic   UBE (Upper  Arm Bike) L 5 3 min fwd/3 min back,, power burst at the end of each minute    Nustep L 6 7 minutes, level 7 x 3 minutes      Lumbar Exercises: Machines for Strengthening   Cybex Knee Flexion 55lb 3x10    Leg Press 80# 3x10    Other Lumbar Machine Exercise Seated rows 55# 3x10, lats 45# 3x10      Lumbar Exercises: Seated   Other Seated Lumbar Exercises partial sit ups 20#                       PT Short Term Goals - 05/10/21 1505       PT SHORT TERM GOAL #1   Title give HEP that he will do at home    Status Achieved               PT Long Term Goals - 10/20/21 1521       PT LONG TERM GOAL #3   Title walk with a cane  or lessed AD x 500 feet    Status Partially Met                   Plan - 10/20/21 1521     Clinical Impression Statement reports very tender knees, I did a lot more exercise today, he was able to get off and on machines that he had not been able to do before for the lats and rows.  No increase of knee pain.    PT Next Visit Plan see how the knees are going    Consulted and Agree with Plan of Care Patient             Patient will benefit from skilled therapeutic intervention in order to improve the following deficits and impairments:  Abnormal gait, Decreased coordination, Decreased range of motion, Difficulty walking, Decreased endurance, Cardiopulmonary status limiting activity, Decreased activity tolerance, Decreased balance, Improper body mechanics, Postural dysfunction, Decreased strength, Decreased mobility  Visit Diagnosis: Difficulty in walking, not elsewhere classified  Muscle weakness (generalized)     Problem List Patient Active Problem List   Diagnosis Date Noted   Prostate cancer ([place]) 09/26/2012   DYSLIPIDEMIA 05/02/2007   MORBID OBESITY 05/02/2007   OBSTRUCTIVE SLEEP APNEA 05/02/2007   HYPERTENSION 05/02/2007    Sumner Boast, PT 10/20/2021, 3:23 PM  [place] [place], [place], [address] Phone: [phone]   Fax:  [phone]  Name: Daniel [name] MRN: [id number] Date of Birth: [birth year]

## 2021-11-01 ENCOUNTER — Ambulatory Visit: Payer: Medicare PPO | Attending: Internal Medicine | Admitting: Physical Therapy

## 2021-11-01 ENCOUNTER — Encounter: Payer: Self-pay | Admitting: Physical Therapy

## 2021-11-01 DIAGNOSIS — M6281 Muscle weakness (generalized): Secondary | ICD-10-CM | POA: Diagnosis not present

## 2021-11-01 DIAGNOSIS — R262 Difficulty in walking, not elsewhere classified: Secondary | ICD-10-CM | POA: Diagnosis not present

## 2021-11-01 NOTE — Therapy (Signed)
[place] [place], [place], [address] Phone: [phone]   Fax:  [phone]  Physical Therapy Treatment  Patient Details  Name: Daniel [name] MRN: [id number] Date of Birth: 09-28-1955 Referring Provider (PT): Repass   Encounter Date: 11/01/2021   PT End of Session - 11/01/21 1429     Visit Number 65    Date for PT Re-Evaluation 11/24/21    PT Start Time 1415    PT Stop Time 1515    PT Time Calculation (min) 60 min    Activity Tolerance Patient tolerated treatment well    Behavior During Therapy [facility] for tasks assessed/performed             Past Medical History:  Diagnosis Date   Arthritis    Diabetes mellitus without complication ([place])    Elevated PSA 08/29/2012   7.22   Hyperlipidemia    Hypertension    Obstructive sleep apnea    CPAP   Prostate cancer [facility]) Feb 2014   Spinal stenosis     Past Surgical History:  Procedure Laterality Date   KNEE SURGERY     torn meniscus   PILONIDAL CYST EXCISION     lower spine age 45   PROSTATE BIOPSY Bilateral 08/29/2012   gleason 3+3=6  Dr.Marc Nesi   RADIOACTIVE SEED IMPLANT N/A 11/19/2012   Procedure: RADIOACTIVE SEED IMPLANT;  Surgeon: Hanley Ben, MD;  [facility];  Service: Urology;  Laterality: N/A;   SPINAL CORD DECOMPRESSION     Spinal injection  2012   spinal stenosis   TONSILLECTOMY     as child age 41 or 5    There were no vitals filed for this visit.   Subjective Assessment - 11/01/21 1428     Subjective "I feel very good"    Pertinent History DM, sleep apnea, HTN, prostate CA    Currently in Pain? No/denies                               [facility] Adult PT Treatment/Exercise - 11/01/21 0001       Ambulation/Gait   Stairs Yes    Stairs Assistance 5: Supervision;4: Min guard    Stair Management Technique Two rails;Step to pattern;Alternating pattern    Number of Stairs 9    Height of Stairs 4      High  Level Balance   High Level Balance Comments on airex reaching outside BOS      Lumbar Exercises: Stretches   Active Hamstring Stretch 10 seconds;2 reps;Right;Left    Other Lumbar Stretch Exercise lumbar stretch with ball x 4      Lumbar Exercises: Aerobic   UBE (Upper Arm Bike) L 5 3 min fwd/3 min back,, power burst at the end of each minute    Nustep L7 x8 min      Lumbar Exercises: Machines for Strengthening   Leg Press 100lb 2x15      Lumbar Exercises: Seated   Sit to Stand 5 reps   x3                      PT Short Term Goals - 05/10/21 1505       PT SHORT TERM GOAL #1   Title give HEP that he will do at home    Status Achieved  PT Long Term Goals - 11/01/21 1429       PT LONG TERM GOAL #3   Title walk with a cane or lessed AD x 500 feet    Status Partially Met      PT LONG TERM GOAL #4   Title get up from sitting without using hands    Status Partially Met                   Plan - 11/01/21 1514     Clinical Impression Statement Pt enters therapy after taking a week off. During the week off pt reported R knee pain that has improved. Centered on functional strength and mobility. LLE weakness present with stair negotiation. Pt became unstable after standing in attempt of second gait trial but was too unstable to attempt.    Rehab Potential Good    PT Frequency 2x / week    PT Duration 12 weeks    PT Treatment/Interventions ADLs/Self Care Home Management;Gait training;Neuromuscular re-education;Balance training;Therapeutic exercise;Therapeutic activities;Functional mobility training;Stair training;Patient/family education;Manual techniques    PT Next Visit Plan progress as tolerated             Patient will benefit from skilled therapeutic intervention in order to improve the following deficits and impairments:  Abnormal gait, Decreased coordination, Decreased range of motion, Difficulty walking, Decreased endurance,  Cardiopulmonary status limiting activity, Decreased activity tolerance, Decreased balance, Improper body mechanics, Postural dysfunction, Decreased strength, Decreased mobility  Visit Diagnosis: Muscle weakness (generalized)  Difficulty in walking, not elsewhere classified     Problem List Patient Active Problem List   Diagnosis Date Noted   Prostate cancer ([place]) 09/26/2012   DYSLIPIDEMIA 05/02/2007   MORBID OBESITY 05/02/2007   OBSTRUCTIVE SLEEP APNEA 05/02/2007   HYPERTENSION 05/02/2007    [name], PTA 11/01/2021, 3:20 PM  [place]. [place], [place], [address] Phone: [phone]   Fax:  [phone]  Name: Daniel [name] MRN: [id number] Date of Birth: 11-10-1955

## 2021-11-03 ENCOUNTER — Encounter: Payer: Self-pay | Admitting: Physical Therapy

## 2021-11-03 ENCOUNTER — Ambulatory Visit: Payer: Medicare PPO | Admitting: Physical Therapy

## 2021-11-03 DIAGNOSIS — R262 Difficulty in walking, not elsewhere classified: Secondary | ICD-10-CM

## 2021-11-03 DIAGNOSIS — M6281 Muscle weakness (generalized): Secondary | ICD-10-CM | POA: Diagnosis not present

## 2021-11-03 NOTE — Therapy (Signed)
[place] [place], [place], [address] Phone: [phone]   Fax:  [phone]  Physical Therapy Treatment  Patient Details  Name: Daniel [name] MRN: [id number] Date of Birth: November 17, 1955 Referring Provider (PT): Repass   Encounter Date: 11/03/2021   PT End of Session - 11/03/21 1511     Visit Number 23    Date for PT Re-Evaluation 11/24/21    PT Start Time 3212    PT Stop Time 1511    PT Time Calculation (min) 56 min    Activity Tolerance Patient tolerated treatment well    Behavior During Therapy [facility] for tasks assessed/performed             Past Medical History:  Diagnosis Date   Arthritis    Diabetes mellitus without complication ([place])    Elevated PSA 08/29/2012   7.22   Hyperlipidemia    Hypertension    Obstructive sleep apnea    CPAP   Prostate cancer [facility]) Feb 2014   Spinal stenosis     Past Surgical History:  Procedure Laterality Date   KNEE SURGERY     torn meniscus   PILONIDAL CYST EXCISION     lower spine age 45   PROSTATE BIOPSY Bilateral 08/29/2012   gleason 3+3=6  Dr.Marc Nesi   RADIOACTIVE SEED IMPLANT N/A 11/19/2012   Procedure: RADIOACTIVE SEED IMPLANT;  Surgeon: Hanley Ben, MD;  [facility];  Service: Urology;  Laterality: N/A;   SPINAL CORD DECOMPRESSION     Spinal injection  2012   spinal stenosis   TONSILLECTOMY     as child age 41 or 5    There were no vitals filed for this visit.   Subjective Assessment - 11/03/21 1421     Subjective "I feel good" Just some arthritic tenderness    Currently in Pain? No/denies                               [facility] Adult PT Treatment/Exercise - 11/03/21 0001       Ambulation/Gait   Ambulation/Gait Yes    Ambulation Distance (Feet) 125 Feet    Assistive device None    Gait Pattern Step-through pattern;Decreased stride length    Ambulation Surface Indoor    Stairs Yes    Stairs Assistance 5:  Supervision;4: Min guard    Stair Management Technique Two rails;Alternating pattern    Number of Stairs 9    Height of Stairs 4    Gait Comments ambulate no device CGA 112f x3      Lumbar Exercises: Aerobic   UBE (Upper Arm Bike) L 5 3 min fwd/3 min back,, power burst at the end of each minute    Nustep L7 x8 min      Lumbar Exercises: Machines for Strengthening   Cybex Knee Extension 25lb 3x10    Cybex Knee Flexion 55lb 3x10      Knee/Hip Exercises: Stretches   Active Hamstring Stretch Both;20 seconds;5 reps                       PT Short Term Goals - 05/10/21 1505       PT SHORT TERM GOAL #1   Title give HEP that he will do at home    Status Achieved               PT Long Term Goals -  11/01/21 1429       PT LONG TERM GOAL #3   Title walk with a cane or lessed AD x 500 feet    Status Partially Met      PT LONG TERM GOAL #4   Title get up from sitting without using hands    Status Partially Met                   Plan - 11/03/21 1511     Clinical Impression Statement Pt did really well during today's session progressing with gait. Pt increase his tolerance completing additional gait trials. Stair negotiation seemed to be more fluid before he fatigue on the third set. Good strength with machine level interventions    Rehab Potential Good    PT Frequency 2x / week    PT Duration 12 weeks    PT Treatment/Interventions ADLs/Self Care Home Management;Gait training;Neuromuscular re-education;Balance training;Therapeutic exercise;Therapeutic activities;Functional mobility training;Stair training;Patient/family education;Manual techniques    PT Next Visit Plan progress as tolerated             Patient will benefit from skilled therapeutic intervention in order to improve the following deficits and impairments:  Abnormal gait, Decreased coordination, Decreased range of motion, Difficulty walking, Decreased endurance, Cardiopulmonary status  limiting activity, Decreased activity tolerance, Decreased balance, Improper body mechanics, Postural dysfunction, Decreased strength, Decreased mobility  Visit Diagnosis: Muscle weakness (generalized)  Difficulty in walking, not elsewhere classified     Problem List Patient Active Problem List   Diagnosis Date Noted   Prostate cancer ([place]) 09/26/2012   DYSLIPIDEMIA 05/02/2007   MORBID OBESITY 05/02/2007   OBSTRUCTIVE SLEEP APNEA 05/02/2007   HYPERTENSION 05/02/2007    [name], PTA 11/03/2021, 3:14 PM  [place]. [place], [place], [address] Phone: [phone]   Fax:  [phone]  Name: Daniel [name] MRN: [id number] Date of Birth: 08/21/1955

## 2021-11-07 DIAGNOSIS — H52203 Unspecified astigmatism, bilateral: Secondary | ICD-10-CM | POA: Diagnosis not present

## 2021-11-07 DIAGNOSIS — H25013 Cortical age-related cataract, bilateral: Secondary | ICD-10-CM | POA: Diagnosis not present

## 2021-11-07 DIAGNOSIS — H5213 Myopia, bilateral: Secondary | ICD-10-CM | POA: Diagnosis not present

## 2021-11-07 DIAGNOSIS — H43393 Other vitreous opacities, bilateral: Secondary | ICD-10-CM | POA: Diagnosis not present

## 2021-11-07 DIAGNOSIS — H2513 Age-related nuclear cataract, bilateral: Secondary | ICD-10-CM | POA: Diagnosis not present

## 2021-11-07 DIAGNOSIS — Z7984 Long term (current) use of oral hypoglycemic drugs: Secondary | ICD-10-CM | POA: Diagnosis not present

## 2021-11-07 DIAGNOSIS — H401131 Primary open-angle glaucoma, bilateral, mild stage: Secondary | ICD-10-CM | POA: Diagnosis not present

## 2021-11-07 DIAGNOSIS — E119 Type 2 diabetes mellitus without complications: Secondary | ICD-10-CM | POA: Diagnosis not present

## 2021-11-07 DIAGNOSIS — H524 Presbyopia: Secondary | ICD-10-CM | POA: Diagnosis not present

## 2021-11-08 ENCOUNTER — Ambulatory Visit: Payer: Medicare PPO | Admitting: Physical Therapy

## 2021-11-08 ENCOUNTER — Encounter: Payer: Self-pay | Admitting: Physical Therapy

## 2021-11-08 DIAGNOSIS — M6281 Muscle weakness (generalized): Secondary | ICD-10-CM

## 2021-11-08 DIAGNOSIS — R262 Difficulty in walking, not elsewhere classified: Secondary | ICD-10-CM

## 2021-11-08 NOTE — Therapy (Signed)
[place]. [place], [place], [address] Phone: [phone]   Fax:  [phone]  Physical Therapy Treatment  Patient Details  Name: Daniel [name] MRN: [id number] Date of Birth: 1955/10/30 Referring Provider (PT): Repass   Encounter Date: 11/08/2021   PT End of Session - 11/08/21 1511     Visit Number 69    Date for PT Re-Evaluation 11/24/21    PT Start Time 1416    PT Stop Time 1513    PT Time Calculation (min) 57 min    Activity Tolerance Patient tolerated treatment well    Behavior During Therapy [facility] for tasks assessed/performed             Past Medical History:  Diagnosis Date   Arthritis    Diabetes mellitus without complication ([place])    Elevated PSA 08/29/2012   7.22   Hyperlipidemia    Hypertension    Obstructive sleep apnea    CPAP   Prostate cancer [facility]) Feb 2014   Spinal stenosis     Past Surgical History:  Procedure Laterality Date   KNEE SURGERY     torn meniscus   PILONIDAL CYST EXCISION     lower spine age 34   PROSTATE BIOPSY Bilateral 08/29/2012   gleason 3+3=6  Dr.Marc Nesi   RADIOACTIVE SEED IMPLANT N/A 11/19/2012   Procedure: RADIOACTIVE SEED IMPLANT;  Surgeon: Hanley Ben, MD;  [facility];  Service: Urology;  Laterality: N/A;   SPINAL CORD DECOMPRESSION     Spinal injection  2012   spinal stenosis   TONSILLECTOMY     as child age 89 or 5    There were no vitals filed for this visit.   Subjective Assessment - 11/08/21 1427     Subjective "Pretty good"    Currently in Pain? No/denies                               [facility] Adult PT Treatment/Exercise - 11/08/21 0001       Ambulation/Gait   Ambulation/Gait Yes    Ambulation Distance (Feet) 125 Feet    Assistive device None    Gait Pattern Step-through pattern;Decreased stride length    Ambulation Surface Indoor;Level    Stairs Yes    Stairs Assistance 5: Supervision;4: Min guard    Stair  Management Technique Two rails;Alternating pattern    Number of Stairs 11    Height of Stairs 4   6   Gait Comments ambulate no device CGA 138ft x2      Lumbar Exercises: Aerobic   UBE (Upper Arm Bike) L 5 3 min fwd/3 min back,, power burst at the end of each minute    Nustep L7 x8 min      Knee/Hip Exercises: Standing   Walking with Sports Cord 30lb resisted gait with ball toss at end x3    Other Standing Knee Exercises Ball toss with SL 2in elevation in staggered stanc weighted ball toss    Other Standing Knee Exercises Resistd side step Blue Tband                       PT Short Term Goals - 05/10/21 1505       PT SHORT TERM GOAL #1   Title give HEP that he will do at home    Status Achieved  PT Long Term Goals - 11/01/21 1429       PT LONG TERM GOAL #3   Title walk with a cane or lessed AD x 500 feet    Status Partially Met      PT LONG TERM GOAL #4   Title get up from sitting without using hands    Status Partially Met                   Plan - 11/08/21 1511     Clinical Impression Statement Pt enters doing well. Session focused on balance, gait, and functional strength.  Pt did require some UE use with resisted gait with ball toss to maintain balance. Staggered stand with foot elevated was difficult when forcing pt to put mote wright on LLE. LOB x1 with resisted side steps requiring mod assist to regain balance. Some weakness present with stair negotiation.    Rehab Potential Good    PT Frequency 2x / week    PT Duration 12 weeks    PT Treatment/Interventions ADLs/Self Care Home Management;Gait training;Neuromuscular re-education;Balance training;Therapeutic exercise;Therapeutic activities;Functional mobility training;Stair training;Patient/family education;Manual techniques    PT Next Visit Plan progress as tolerated             Patient will benefit from skilled therapeutic intervention in order to improve the following  deficits and impairments:  Abnormal gait, Decreased coordination, Decreased range of motion, Difficulty walking, Decreased endurance, Cardiopulmonary status limiting activity, Decreased activity tolerance, Decreased balance, Improper body mechanics, Postural dysfunction, Decreased strength, Decreased mobility  Visit Diagnosis: Muscle weakness (generalized)  Difficulty in walking, not elsewhere classified     Problem List Patient Active Problem List   Diagnosis Date Noted   Prostate cancer ([place]) 09/26/2012   DYSLIPIDEMIA 05/02/2007   MORBID OBESITY 05/02/2007   OBSTRUCTIVE SLEEP APNEA 05/02/2007   HYPERTENSION 05/02/2007    [name], PTA 11/08/2021, 3:16 PM  [place]. [place], [place], [address] Phone: [phone]   Fax:  [phone]  Name: Daniel [name] MRN: [id number] Date of Birth: 11/28/1955

## 2021-11-10 ENCOUNTER — Encounter: Payer: Self-pay | Admitting: Physical Therapy

## 2021-11-10 ENCOUNTER — Ambulatory Visit: Payer: Medicare PPO | Admitting: Physical Therapy

## 2021-11-10 DIAGNOSIS — M6281 Muscle weakness (generalized): Secondary | ICD-10-CM

## 2021-11-10 DIAGNOSIS — R262 Difficulty in walking, not elsewhere classified: Secondary | ICD-10-CM | POA: Diagnosis not present

## 2021-11-10 NOTE — Therapy (Signed)
[place]. [place], [place], [address] Phone: [phone]   Fax:  [phone]  Physical Therapy Treatment  Patient Details  Name: Daniel [name] MRN: [id number] Date of Birth: [birth year] Referring Provider (PT): Repass   Encounter Date: 11/10/2021   PT End of Session - 11/10/21 1537     Visit Number 34    Date for PT Re-Evaluation 11/24/21    Authorization Type [employer]    Authorization Time Period 11/20 [employer]    PT Start Time 1435    PT Stop Time 1530    PT Time Calculation (min) 55 min    Equipment Utilized During Treatment Gait belt    Activity Tolerance Patient tolerated treatment well    Behavior During Therapy [facility] for tasks assessed/performed             Past Medical History:  Diagnosis Date   Arthritis    Diabetes mellitus without complication ([place])    Elevated PSA 08/29/2012   7.22   Hyperlipidemia    Hypertension    Obstructive sleep apnea    CPAP   Prostate cancer [facility]) Feb 2014   Spinal stenosis     Past Surgical History:  Procedure Laterality Date   KNEE SURGERY     torn meniscus   PILONIDAL CYST EXCISION     lower spine age 3   PROSTATE BIOPSY Bilateral 08/29/2012   gleason 3+3=6  Dr.Marc Nesi   RADIOACTIVE SEED IMPLANT N/A 11/19/2012   Procedure: RADIOACTIVE SEED IMPLANT;  Surgeon: Hanley Ben, MD;  [facility];  Service: Urology;  Laterality: N/A;   SPINAL CORD DECOMPRESSION     Spinal injection  2012   spinal stenosis   TONSILLECTOMY     as child age 18 or 5    There were no vitals filed for this visit.   Subjective Assessment - 11/10/21 1451     Subjective Feeling good okay    Pertinent History DM, sleep apnea, HTN, prostate CA    Limitations Lifting;Standing;Walking;House hold activities    Currently in Pain? No/denies    Pain Score 0-No pain                               [facility] Adult PT Treatment/Exercise - 11/10/21 0001       Lumbar  Exercises: Aerobic   UBE (Upper Arm Bike) L 5 3 min fwd/3 min back    Nustep L7 x8 min      Knee/Hip Exercises: Standing   Forward Step Up Step Height: 4";10 reps;Both;Other (comment)   in // bars with bilateral arm support, heavy use of UE's   Stairs ascend/descend stairs with bilateral handrail and reciprocal pattern, x4    Other Standing Knee Exercises modified functional quats x10, modified functional lunges x10    Other Standing Knee Exercises partial quats with HHA x10, standing with HHA and kick ball x 20                       PT Short Term Goals - 05/10/21 1505       PT SHORT TERM GOAL #1   Title give HEP that he will do at home    Status Achieved               PT Long Term Goals - 11/01/21 1429       PT LONG TERM GOAL #3  Title walk with a cane or lessed AD x 500 feet    Status Partially Met      PT LONG TERM GOAL #4   Title get up from sitting without using hands    Status Partially Met                   Plan - 11/10/21 1538     Clinical Impression Statement Pt enters today doing well. We did some various functional acitivies in // bars to promote weight bearing on LE's while flexing knees. Pt hesistent due to fear of falling. Was able to perform reciprocal step pattern up/down stairs with difficulty advancing RLE. Modified functional squats performed with emphasis on use of LE's, as pt tends to use UE's. Most difficulty with forward step ups as pt shows difficulty flexing hips to advance LE. Will benefit from additional strengthening and functional training to promote functional independence.             Patient will benefit from skilled therapeutic intervention in order to improve the following deficits and impairments:  Abnormal gait, Decreased coordination, Decreased range of motion, Difficulty walking, Decreased endurance, Cardiopulmonary status limiting activity, Decreased activity tolerance, Decreased balance, Improper body  mechanics, Postural dysfunction, Decreased strength, Decreased mobility  Visit Diagnosis: Muscle weakness (generalized)  Difficulty in walking, not elsewhere classified     Problem List Patient Active Problem List   Diagnosis Date Noted   Prostate cancer ([place]) 09/26/2012   DYSLIPIDEMIA 05/02/2007   MORBID OBESITY 05/02/2007   OBSTRUCTIVE SLEEP APNEA 05/02/2007   HYPERTENSION 05/02/2007    Clearance Coots, SPTA 11/10/2021, 3:55 PM  [place]. [place], [place], [address] Phone: [phone]   Fax:  [phone]  Name: Daniel [name] MRN: [id number] Date of Birth: January 31, 1956

## 2021-11-15 ENCOUNTER — Encounter: Payer: Self-pay | Admitting: Physical Therapy

## 2021-11-15 ENCOUNTER — Ambulatory Visit: Payer: Medicare PPO | Admitting: Physical Therapy

## 2021-11-15 DIAGNOSIS — M6281 Muscle weakness (generalized): Secondary | ICD-10-CM

## 2021-11-15 DIAGNOSIS — R262 Difficulty in walking, not elsewhere classified: Secondary | ICD-10-CM

## 2021-11-15 NOTE — Therapy (Signed)
[address] [place]. [place], [place], [address] Phone: [phone]   Fax:  [phone]  Physical Therapy Treatment  Patient Details  Name: Daniel [name] MRN: [id number] Date of Birth: 1956-03-22 Referring Provider (PT): Repass   Encounter Date: 11/15/2021   PT End of Session - 11/15/21 1347     Visit Number 7    Date for PT Re-Evaluation 11/24/21    Authorization Time Period 16/20 [employer]    PT Start Time 1344    PT Stop Time 1443    PT Time Calculation (min) 59 min    Equipment Utilized During Treatment Gait belt    Activity Tolerance Patient tolerated treatment well    Behavior During Therapy [facility] for tasks assessed/performed             Past Medical History:  Diagnosis Date   Arthritis    Diabetes mellitus without complication ([place])    Elevated PSA 08/29/2012   7.22   Hyperlipidemia    Hypertension    Obstructive sleep apnea    CPAP   Prostate cancer [facility]) Feb 2014   Spinal stenosis     Past Surgical History:  Procedure Laterality Date   KNEE SURGERY     torn meniscus   PILONIDAL CYST EXCISION     lower spine age 38   PROSTATE BIOPSY Bilateral 08/29/2012   gleason 3+3=6  Dr.Marc Nesi   RADIOACTIVE SEED IMPLANT N/A 11/19/2012   Procedure: RADIOACTIVE SEED IMPLANT;  Surgeon: Hanley Ben, MD;  Location: [facility];  Service: Urology;  Laterality: N/A;   SPINAL CORD DECOMPRESSION     Spinal injection  2012   spinal stenosis   TONSILLECTOMY     as child age 3 or 5    There were no vitals filed for this visit.   Subjective Assessment - 11/15/21 1347     Subjective I was spent, I was very tired, I think sometimes I over do it with the activities then cannot do much after    Currently in Pain? No/denies                               [facility] Adult PT Treatment/Exercise - 11/15/21 0001       Ambulation/Gait   Gait Comments no device with gait belt CGA x 240 feet      Lumbar  Exercises: Stretches   Passive Hamstring Stretch Right;Left;3 reps;20 seconds      Lumbar Exercises: Aerobic   UBE (Upper Arm Bike) Level 5 with power burst at last of every minute    Nustep L7 x8 min      Lumbar Exercises: Machines for Strengthening   Other Lumbar Machine Exercise 55# chest press 3x10    Other Lumbar Machine Exercise Seated rows 55# 3x10, lats 55# 3x10      Lumbar Exercises: Standing   Other Standing Lumbar Exercises High knee march in place using the ski poles, hip abduction with the poles as well      Lumbar Exercises: Seated   Other Seated Lumbar Exercises ball toss and catch working on core      Lumbar Exercises: Supine   Other Supine Lumbar Exercises feet on ball K2C, obliques, small bridges and isometrci abs      Lumbar Exercises: Prone   Other Prone Lumbar Exercises modified planks 5second holds x 5  PT Short Term Goals - 05/10/21 1505       PT SHORT TERM GOAL #1   Title give HEP that he will do at home    Status Achieved               PT Long Term Goals - 11/01/21 1429       PT LONG TERM GOAL #3   Title walk with a cane or lessed AD x 500 feet    Status Partially Met      PT LONG TERM GOAL #4   Title get up from sitting without using hands    Status Partially Met                   Plan - 11/15/21 1436     Clinical Impression Statement We worked a little more core today , he was able to do a modified plank, able to walk 240 feet with CGA,   He is very motivated and I feel that he is making progres and improving, he wants to work on the quality of life that he can lead and his functional independence    PT Next Visit Plan work on function and quality of life    Consulted and Agree with Plan of Care Patient             Patient will benefit from skilled therapeutic intervention in order to improve the following deficits and impairments:  Abnormal gait, Decreased coordination, Decreased  range of motion, Difficulty walking, Decreased endurance, Cardiopulmonary status limiting activity, Decreased activity tolerance, Decreased balance, Improper body mechanics, Postural dysfunction, Decreased strength, Decreased mobility  Visit Diagnosis: Muscle weakness (generalized)  Difficulty in walking, not elsewhere classified     Problem List Patient Active Problem List   Diagnosis Date Noted   Prostate cancer ([place]) 09/26/2012   DYSLIPIDEMIA 05/02/2007   MORBID OBESITY 05/02/2007   OBSTRUCTIVE SLEEP APNEA 05/02/2007   HYPERTENSION 05/02/2007    Sumner Boast, PT 11/15/2021, 2:40 PM  [place]. [place], [place], [address] Phone: [phone]   Fax:  [phone]  Name: Daniel [name] MRN: [id number] Date of Birth: [birth year]

## 2021-11-17 ENCOUNTER — Ambulatory Visit: Payer: Medicare PPO | Admitting: Physical Therapy

## 2021-11-17 ENCOUNTER — Encounter: Payer: Self-pay | Admitting: Physical Therapy

## 2021-11-17 DIAGNOSIS — M6281 Muscle weakness (generalized): Secondary | ICD-10-CM

## 2021-11-17 DIAGNOSIS — R262 Difficulty in walking, not elsewhere classified: Secondary | ICD-10-CM

## 2021-11-17 NOTE — Therapy (Signed)
[address]. [place], [place], [address] Phone: [phone]   Fax:  [phone]  Physical Therapy Treatment  Patient Details  Name: Daniel [name] MRN: [id number] Date of Birth: 09/09/1955 Referring Provider (PT): Repass   Encounter Date: 11/17/2021   PT End of Session - 11/17/21 1511     Visit Number 8    Date for PT Re-Evaluation 11/24/21    PT Start Time 1419    PT Stop Time 1515    PT Time Calculation (min) 56 min    Activity Tolerance Patient tolerated treatment well    Behavior During Therapy [facility] for tasks assessed/performed             Past Medical History:  Diagnosis Date   Arthritis    Diabetes mellitus without complication ([place])    Elevated PSA 08/29/2012   7.22   Hyperlipidemia    Hypertension    Obstructive sleep apnea    CPAP   Prostate cancer [facility]) Feb 2014   Spinal stenosis     Past Surgical History:  Procedure Laterality Date   KNEE SURGERY     torn meniscus   PILONIDAL CYST EXCISION     lower spine age 57   PROSTATE BIOPSY Bilateral 08/29/2012   gleason 3+3=6  Dr.Marc Nesi   RADIOACTIVE SEED IMPLANT N/A 11/19/2012   Procedure: RADIOACTIVE SEED IMPLANT;  Surgeon: Hanley Ben, MD;  [facility];  Service: Urology;  Laterality: N/A;   SPINAL CORD DECOMPRESSION     Spinal injection  2012   spinal stenosis   TONSILLECTOMY     as child age 58 or 5    There were no vitals filed for this visit.   Subjective Assessment - 11/17/21 1429     Subjective Low energy today, did not sleep well last night    Currently in Pain? No/denies                               [facility] Adult PT Treatment/Exercise - 11/17/21 0001       Ambulation/Gait   Gait Comments no device with gait belt CGA x 240 feet      High Level Balance   High Level Balance Comments Ball kicks with beach ball, standing ball kicks      Lumbar Exercises: Stretches   Passive Hamstring Stretch  Right;Left;3 reps;20 seconds    Other Lumbar Stretch Exercise lumbar stretch with ball x 4      Lumbar Exercises: Aerobic   UBE (Upper Arm Bike) Level 5 with power burst at last of every minute    Nustep L7 x8 min      Lumbar Exercises: Machines for Strengthening   Other Lumbar Machine Exercise Seated rows 65# 2x10, lats 65# 2x10      Lumbar Exercises: Seated   Sit to Stand 20 reps   blue ball chest press     Knee/Hip Exercises: Standing   Stairs ascend/descend stairs with bilateral handrail and reciprocal pattern, x5                       PT Short Term Goals - 05/10/21 1505       PT SHORT TERM GOAL #1   Title give HEP that he will do at home    Status Achieved               PT Long Term  Goals - 11/01/21 1429       PT LONG TERM GOAL #3   Title walk with a cane or lessed AD x 500 feet    Status Partially Met      PT LONG TERM GOAL #4   Title get up from sitting without using hands    Status Partially Met                   Plan - 11/17/21 1512     Clinical Impression Statement Pt enters with reports of fatigue due to lack of sleep. Despite subjective reports pt did well during today's session. good carryover from last session with gait distance. Some instability present with ball kicks. pt able to increase the number of stairs. Increase resistance tolerated with seated rows and lats.    Rehab Potential Good    PT Frequency 2x / week    PT Duration 12 weeks    PT Treatment/Interventions ADLs/Self Care Home Management;Gait training;Neuromuscular re-education;Balance training;Therapeutic exercise;Therapeutic activities;Functional mobility training;Stair training;Patient/family education;Manual techniques    PT Next Visit Plan work on function and quality of life             Patient will benefit from skilled therapeutic intervention in order to improve the following deficits and impairments:  Abnormal gait, Decreased coordination, Decreased  range of motion, Difficulty walking, Decreased endurance, Cardiopulmonary status limiting activity, Decreased activity tolerance, Decreased balance, Improper body mechanics, Postural dysfunction, Decreased strength, Decreased mobility  Visit Diagnosis: Muscle weakness (generalized)  Difficulty in walking, not elsewhere classified     Problem List Patient Active Problem List   Diagnosis Date Noted   Prostate cancer ([place]) 09/26/2012   DYSLIPIDEMIA 05/02/2007   MORBID OBESITY 05/02/2007   OBSTRUCTIVE SLEEP APNEA 05/02/2007   HYPERTENSION 05/02/2007    [name], PTA 11/17/2021, 3:14 PM  [place]. [place], [place], [address] Phone: [phone]   Fax:  [phone]  Name: Daniel [name] MRN: [id number] Date of Birth: October 22, 1955

## 2021-11-21 DIAGNOSIS — I82511 Chronic embolism and thrombosis of right femoral vein: Secondary | ICD-10-CM | POA: Diagnosis not present

## 2021-11-21 DIAGNOSIS — E782 Mixed hyperlipidemia: Secondary | ICD-10-CM | POA: Diagnosis not present

## 2021-11-21 DIAGNOSIS — I10 Essential (primary) hypertension: Secondary | ICD-10-CM | POA: Diagnosis not present

## 2021-11-21 DIAGNOSIS — Z0001 Encounter for general adult medical examination with abnormal findings: Secondary | ICD-10-CM | POA: Diagnosis not present

## 2021-11-21 DIAGNOSIS — N529 Male erectile dysfunction, unspecified: Secondary | ICD-10-CM | POA: Diagnosis not present

## 2021-11-21 DIAGNOSIS — M170 Bilateral primary osteoarthritis of knee: Secondary | ICD-10-CM | POA: Diagnosis not present

## 2021-11-21 DIAGNOSIS — E1165 Type 2 diabetes mellitus with hyperglycemia: Secondary | ICD-10-CM | POA: Diagnosis not present

## 2021-11-21 DIAGNOSIS — Z125 Encounter for screening for malignant neoplasm of prostate: Secondary | ICD-10-CM | POA: Diagnosis not present

## 2021-11-22 ENCOUNTER — Ambulatory Visit: Payer: Medicare PPO | Attending: Internal Medicine | Admitting: Physical Therapy

## 2021-11-22 ENCOUNTER — Encounter: Payer: Self-pay | Admitting: Physical Therapy

## 2021-11-22 DIAGNOSIS — M6281 Muscle weakness (generalized): Secondary | ICD-10-CM | POA: Diagnosis not present

## 2021-11-22 DIAGNOSIS — R262 Difficulty in walking, not elsewhere classified: Secondary | ICD-10-CM | POA: Diagnosis not present

## 2021-11-22 NOTE — Therapy (Signed)
[place]. [place], [place], [address] Phone: [phone]   Fax:  [phone]  Physical Therapy Treatment  Patient Details  Name: Daniel [name] MRN: [id number] Date of Birth: 1955/09/26 Referring Provider (PT): Repass   Encounter Date: 11/22/2021   PT End of Session - 11/22/21 1507     Visit Number 56    Date for PT Re-Evaluation 11/24/21    Authorization Time Period 16/20 [employer]    PT Start Time 8466    PT Stop Time 1515    PT Time Calculation (min) 55 min    Activity Tolerance Patient tolerated treatment well    Behavior During Therapy [facility] for tasks assessed/performed             Past Medical History:  Diagnosis Date   Arthritis    Diabetes mellitus without complication ([place])    Elevated PSA 08/29/2012   7.22   Hyperlipidemia    Hypertension    Obstructive sleep apnea    CPAP   Prostate cancer [facility]) Feb 2014   Spinal stenosis     Past Surgical History:  Procedure Laterality Date   KNEE SURGERY     torn meniscus   PILONIDAL CYST EXCISION     lower spine age 54   PROSTATE BIOPSY Bilateral 08/29/2012   gleason 3+3=6  Dr.Marc Nesi   RADIOACTIVE SEED IMPLANT N/A 11/19/2012   Procedure: RADIOACTIVE SEED IMPLANT;  Surgeon: Hanley Ben, MD;  [facility];  Service: Urology;  Laterality: N/A;   SPINAL CORD DECOMPRESSION     Spinal injection  2012   spinal stenosis   TONSILLECTOMY     as child age 22 or 5    There were no vitals filed for this visit.   Subjective Assessment - 11/22/21 1429     Subjective "I feel pretty good" knee was little tendwe on NuStep    Currently in Pain? No/denies                               [facility] Adult PT Treatment/Exercise - 11/22/21 0001       Ambulation/Gait   Gait Comments no device with gait belt CGA x 240 feet      Lumbar Exercises: Stretches   Passive Hamstring Stretch Right;Left;3 reps;20 seconds    Other Lumbar Stretch  Exercise lumbar stretch with ball x 4      Lumbar Exercises: Aerobic   UBE (Upper Arm Bike) Level 5 with power burst at last of every minute    Nustep L7 x8 min      Lumbar Exercises: Machines for Strengthening   Cybex Knee Extension 25lb 3x10    Cybex Knee Flexion 55lb 3x10    Other Lumbar Machine Exercise Seated rows 65# 2x10, lats 65# 2x10      Lumbar Exercises: Standing   Other Standing Lumbar Exercises backwads walking 12 feet x2 LOB x1                       PT Short Term Goals - 05/10/21 1505       PT SHORT TERM GOAL #1   Title give HEP that he will do at home    Status Achieved               PT Long Term Goals - 11/01/21 1429       PT LONG TERM GOAL #3  Title walk with a cane or lessed AD x 500 feet    Status Partially Met      PT LONG TERM GOAL #4   Title get up from sitting without using hands    Status Partially Met                   Plan - 11/22/21 1507     Clinical Impression Statement Pt enters doing well. Gait remains improved overall. He did well attaining his single trial gait distance. Attempted two trials of backwards walking, decrease step length with L and LOB x1. Cues needed to hold contraction with leg curls and extensions. Cues to prevent posterior trunk leaning with seated rows.    Rehab Potential Good    PT Frequency 2x / week    PT Duration 12 weeks    PT Treatment/Interventions ADLs/Self Care Home Management;Gait training;Neuromuscular re-education;Balance training;Therapeutic exercise;Therapeutic activities;Functional mobility training;Stair training;Patient/family education;Manual techniques    PT Next Visit Plan work on function and quality of life             Patient will benefit from skilled therapeutic intervention in order to improve the following deficits and impairments:  Abnormal gait, Decreased coordination, Decreased range of motion, Difficulty walking, Decreased endurance, Cardiopulmonary status  limiting activity, Decreased activity tolerance, Decreased balance, Improper body mechanics, Postural dysfunction, Decreased strength, Decreased mobility  Visit Diagnosis: Muscle weakness (generalized)  Difficulty in walking, not elsewhere classified     Problem List Patient Active Problem List   Diagnosis Date Noted   Prostate cancer ([place]) 09/26/2012   DYSLIPIDEMIA 05/02/2007   MORBID OBESITY 05/02/2007   OBSTRUCTIVE SLEEP APNEA 05/02/2007   HYPERTENSION 05/02/2007    [name], PTA 11/22/2021, 3:11 PM  [place]. [place], [place], [address] Phone: [phone]   Fax:  [phone]  Name: Makye Radle MRN: [id number] Date of Birth: Oct 07, 1955

## 2021-11-24 ENCOUNTER — Encounter: Payer: Self-pay | Admitting: Physical Therapy

## 2021-11-24 ENCOUNTER — Ambulatory Visit: Payer: Medicare PPO | Admitting: Physical Therapy

## 2021-11-24 DIAGNOSIS — M6281 Muscle weakness (generalized): Secondary | ICD-10-CM | POA: Diagnosis not present

## 2021-11-24 DIAGNOSIS — R262 Difficulty in walking, not elsewhere classified: Secondary | ICD-10-CM | POA: Diagnosis not present

## 2021-11-24 NOTE — Therapy (Signed)
[place]. [place], [place], [address] Phone: [phone]   Fax:  [phone]  Physical Therapy Treatment  Patient Details  Name: Daniel [name] MRN: [id number] Date of Birth: March 04, 1956 Referring Provider (PT): Repass   Encounter Date: 11/24/2021   PT End of Session - 11/24/21 1521     Visit Number 69    Date for PT Re-Evaluation 11/24/21    Authorization Type [employer]    Authorization Time Period 16/20 [employer]    PT Start Time 1419    PT Stop Time 1515    PT Time Calculation (min) 56 min    Activity Tolerance Patient tolerated treatment well    Behavior During Therapy [facility] for tasks assessed/performed             Past Medical History:  Diagnosis Date   Arthritis    Diabetes mellitus without complication ([place])    Elevated PSA 08/29/2012   7.22   Hyperlipidemia    Hypertension    Obstructive sleep apnea    CPAP   Prostate cancer [facility]) Feb 2014   Spinal stenosis     Past Surgical History:  Procedure Laterality Date   KNEE SURGERY     torn meniscus   PILONIDAL CYST EXCISION     lower spine age 58   PROSTATE BIOPSY Bilateral 08/29/2012   gleason 3+3=6  Dr.Marc Nesi   RADIOACTIVE SEED IMPLANT N/A 11/19/2012   Procedure: RADIOACTIVE SEED IMPLANT;  Surgeon: Hanley Ben, MD;  [facility];  Service: Urology;  Laterality: N/A;   SPINAL CORD DECOMPRESSION     Spinal injection  2012   spinal stenosis   TONSILLECTOMY     as child age 65 or 5    There were no vitals filed for this visit.   Subjective Assessment - 11/24/21 1426     Subjective "Doing pretty good"    Currently in Pain? No/denies                [facility] PT Assessment - 11/24/21 0001       Timed Up and Go Test   Normal TUG (seconds) 18.89   RW                          [facility] Adult PT Treatment/Exercise - 11/24/21 0001       Ambulation/Gait   Stairs Yes    Stairs Assistance 5: Supervision;4: Min guard     Stair Management Technique Two rails;Alternating pattern    Number of Stairs 9    Height of Stairs 4    Gait Comments no device with gait belt CGA x 240 feet   random commands to stop     Lumbar Exercises: Stretches   Passive Hamstring Stretch Right;Left;3 reps;20 seconds    Other Lumbar Stretch Exercise lumbar stretch with ball x 4      Lumbar Exercises: Aerobic   UBE (Upper Arm Bike) Level 5 with power burst at last of every minute    Nustep L7 x8 min      Lumbar Exercises: Machines for Strengthening   Cybex Knee Extension 25lb 3x10    Cybex Knee Flexion 55lb 3x10    Other Lumbar Machine Exercise Seated rows 65# 3x12,  lats 65# 3x10      Lumbar Exercises: Standing   Other Standing Lumbar Exercises backwads walking 12 feet x2 LOB x1  PT Short Term Goals - 05/10/21 1505       PT SHORT TERM GOAL #1   Title give HEP that he will do at home    Status Achieved               PT Long Term Goals - 11/01/21 1429       PT LONG TERM GOAL #3   Title walk with a cane or lessed AD x 500 feet    Status Partially Met      PT LONG TERM GOAL #4   Title get up from sitting without using hands    Status Partially Met                   Plan - 11/24/21 1522     Clinical Impression Statement Pt arrived feeling fine. He continues to show an overall improvement with his functional mobility, as he progresses to increase is functional independence. He has progressed decreasing his TUG time and well as increase his single trial gait distance without AD indoors. Due to pt being obese he is limited with what he can do at home in fear of falling. During gait trial pt was randomly instructed to stop in order to assess control. Pt would required three to four additional steps to slow down gait and stop. Bilateral LE weakness present with stair negotiation. Pt would benefit for continues skilled PT services to improve his functional independence.     Rehab Potential Good    PT Frequency 2x / week    PT Duration 12 weeks    PT Treatment/Interventions ADLs/Self Care Home Management;Gait training;Neuromuscular re-education;Balance training;Therapeutic exercise;Therapeutic activities;Functional mobility training;Stair training;Patient/family education;Manual techniques    PT Next Visit Plan work on function and quality of life             Patient will benefit from skilled therapeutic intervention in order to improve the following deficits and impairments:  Abnormal gait, Decreased coordination, Decreased range of motion, Difficulty walking, Decreased endurance, Cardiopulmonary status limiting activity, Decreased activity tolerance, Decreased balance, Improper body mechanics, Postural dysfunction, Decreased strength, Decreased mobility  Visit Diagnosis: Muscle weakness (generalized)  Difficulty in walking, not elsewhere classified     Problem List Patient Active Problem List   Diagnosis Date Noted   Prostate cancer ([place]) 09/26/2012   DYSLIPIDEMIA 05/02/2007   MORBID OBESITY 05/02/2007   OBSTRUCTIVE SLEEP APNEA 05/02/2007   HYPERTENSION 05/02/2007    [name], PTA 11/24/2021, 3:37 PM  [place]. [place], [place], [address] Phone: [phone]   Fax:  [phone]  Name: Daniel [name] MRN: [id number] Date of Birth: [birth year]

## 2021-11-29 ENCOUNTER — Encounter: Payer: Self-pay | Admitting: Physical Therapy

## 2021-11-29 ENCOUNTER — Ambulatory Visit: Payer: Medicare PPO | Admitting: Physical Therapy

## 2021-11-29 DIAGNOSIS — M6281 Muscle weakness (generalized): Secondary | ICD-10-CM

## 2021-11-29 DIAGNOSIS — R262 Difficulty in walking, not elsewhere classified: Secondary | ICD-10-CM | POA: Diagnosis not present

## 2021-11-29 NOTE — Therapy (Signed)
[place]. [place], [place], [address] Phone: [phone]   Fax:  [phone]  Physical Therapy Treatment  Patient Details  Name: Daniel [name] MRN: [id number] Date of Birth: 1955-05-06 Referring Provider (PT): Repass   Encounter Date: 11/29/2021   PT End of Session - 11/29/21 1521     Visit Number 55    Date for PT Re-Evaluation 02/03/22    PT Start Time 1415    PT Stop Time 1515    PT Time Calculation (min) 60 min    Activity Tolerance Patient tolerated treatment well    Behavior During Therapy [facility] for tasks assessed/performed             Past Medical History:  Diagnosis Date   Arthritis    Diabetes mellitus without complication ([place])    Elevated PSA 08/29/2012   7.22   Hyperlipidemia    Hypertension    Obstructive sleep apnea    CPAP   Prostate cancer [facility]) Feb 2014   Spinal stenosis     Past Surgical History:  Procedure Laterality Date   KNEE SURGERY     torn meniscus   PILONIDAL CYST EXCISION     lower spine age 42   PROSTATE BIOPSY Bilateral 08/29/2012   gleason 3+3=6  Dr.Marc Nesi   RADIOACTIVE SEED IMPLANT N/A 11/19/2012   Procedure: RADIOACTIVE SEED IMPLANT;  Surgeon: Hanley Ben, MD;  [facility];  Service: Urology;  Laterality: N/A;   SPINAL CORD DECOMPRESSION     Spinal injection  2012   spinal stenosis   TONSILLECTOMY     as child age 30 or 5    There were no vitals filed for this visit.   Subjective Assessment - 11/29/21 1428     Subjective "Doing pretty good"    Currently in Pain? Yes    Pain Score 2     Pain Location Knee    Pain Orientation Left;Right                               [facility] Adult PT Treatment/Exercise - 11/29/21 0001       Ambulation/Gait   Gait Comments no device with gait belt CGA x 285 feet      High Level Balance   High Level Balance Activities Side stepping    High Level Balance Comments Ball kicks with beach ball,  standing ball kicks      Lumbar Exercises: Stretches   Passive Hamstring Stretch Right;Left;3 reps;20 seconds    Other Lumbar Stretch Exercise lumbar stretch with ball x 4      Lumbar Exercises: Aerobic   UBE (Upper Arm Bike) Level 5 with power burst at last of every minute    Nustep L7 x8 min      Lumbar Exercises: Machines for Strengthening   Cybex Knee Extension 25lb 3x10    Cybex Knee Flexion 55lb 3x10      Lumbar Exercises: Standing   Other Standing Lumbar Exercises OHP 4lb 2x10      Lumbar Exercises: Seated   Sit to Stand 10 reps   x2 OHP yellow ball                      PT Short Term Goals - 05/10/21 1505       PT SHORT TERM GOAL #1   Title give HEP that he will do at home  Status Achieved               PT Long Term Goals - 11/01/21 1429       PT LONG TERM GOAL #3   Title walk with a cane or lessed AD x 500 feet    Status Partially Met      PT LONG TERM GOAL #4   Title get up from sitting without using hands    Status Partially Met                   Plan - 11/29/21 1522     Clinical Impression Statement Pt arrives feeling well with a low pain rating in both knees. He has progressed increasing his single trial gait distance. Pt stated that his R leg felt weak during ambulation and needed to stop. Cue for anterior weight shift needed with sit to stands. CGA needed with ball kicks. Cue not to allow LE to brace against table with side steps.    Rehab Potential Good    PT Frequency 2x / week    PT Duration 12 weeks    PT Treatment/Interventions ADLs/Self Care Home Management;Gait training;Neuromuscular re-education;Balance training;Therapeutic exercise;Therapeutic activities;Functional mobility training;Stair training;Patient/family education;Manual techniques    PT Next Visit Plan work on function and quality of life             Patient will benefit from skilled therapeutic intervention in order to improve the following  deficits and impairments:  Abnormal gait, Decreased coordination, Decreased range of motion, Difficulty walking, Decreased endurance, Cardiopulmonary status limiting activity, Decreased activity tolerance, Decreased balance, Improper body mechanics, Postural dysfunction, Decreased strength, Decreased mobility  Visit Diagnosis: Muscle weakness (generalized)     Problem List Patient Active Problem List   Diagnosis Date Noted   Prostate cancer ([place]) 09/26/2012   DYSLIPIDEMIA 05/02/2007   MORBID OBESITY 05/02/2007   OBSTRUCTIVE SLEEP APNEA 05/02/2007   HYPERTENSION 05/02/2007    [name], PTA 11/29/2021, 3:27 PM  [place]. [place], [place], [address] Phone: [phone]   Fax:  [phone]  Name: Daniel [name] MRN: [id number] Date of Birth: [birth year]

## 2021-12-01 ENCOUNTER — Ambulatory Visit: Payer: Medicare PPO | Admitting: Physical Therapy

## 2021-12-01 DIAGNOSIS — R262 Difficulty in walking, not elsewhere classified: Secondary | ICD-10-CM

## 2021-12-01 DIAGNOSIS — M6281 Muscle weakness (generalized): Secondary | ICD-10-CM

## 2021-12-01 NOTE — Therapy (Signed)
[place]. [place], [place], [address] Phone: [phone]   Fax:  [phone]  Physical Therapy Treatment  Patient Details  Name: Daniel [name] MRN: [id number] Date of Birth: [birth year] Referring Provider (PT): Repass   Encounter Date: 12/01/2021   PT End of Session - 12/01/21 1514     Visit Number 11    Date for PT Re-Evaluation 02/03/22    Authorization Type [employer]    PT Start Time 1430    PT Stop Time 7989    PT Time Calculation (min) 60 min             Past Medical History:  Diagnosis Date   Arthritis    Diabetes mellitus without complication ([place])    Elevated PSA 08/29/2012   7.22   Hyperlipidemia    Hypertension    Obstructive sleep apnea    CPAP   Prostate cancer [facility]) Feb 2014   Spinal stenosis     Past Surgical History:  Procedure Laterality Date   KNEE SURGERY     torn meniscus   PILONIDAL CYST EXCISION     lower spine age 63   PROSTATE BIOPSY Bilateral 08/29/2012   gleason 3+3=6  Dr.Marc Nesi   RADIOACTIVE SEED IMPLANT N/A 11/19/2012   Procedure: RADIOACTIVE SEED IMPLANT;  Surgeon: Hanley Ben, MD;  [facility]: [facility];  Service: Urology;  Laterality: N/A;   SPINAL CORD DECOMPRESSION     Spinal injection  2012   spinal stenosis   TONSILLECTOMY     as child age 72 or 5    There were no vitals filed for this visit.   Subjective Assessment - 12/01/21 1440     Subjective doing okay    Currently in Pain? Yes    Pain Score 2     Pain Location Knee                               [facility] Adult PT Treatment/Exercise - 12/01/21 0001       Ambulation/Gait   Gait Comments no device with gait belt CGA x 375 feet with some speed control issues as he gets going to fast      Lumbar Exercises: Stretches   Passive Hamstring Stretch Right;Left;3 reps;20 seconds      Lumbar Exercises: Aerobic   Tread Mill .36mph 5 min with UE needed working on fluid step through and  stamina    UBE (Upper Arm Bike) Level 5 with power burst at last of every minute      Lumbar Exercises: Standing   Row Strengthening;Power tower;Both;20 reps   15#   Shoulder Extension Strengthening;Power Tower;Both;20 reps   15#   Other Standing Lumbar Exercises resisted gait fwd 30# 5 x   min A with some instability     Lumbar Exercises: Seated   Sit to Stand 10 reps   without UE on lat pull down seat                      PT Short Term Goals - 05/10/21 1505       PT SHORT TERM GOAL #1   Title give HEP that he will do at home    Status Achieved               PT Long Term Goals - 12/01/21 1449       PT LONG TERM GOAL #1  Title independent with an advanced HEP    Status Achieved      PT LONG TERM GOAL #2   Title decrease TUG time to 18 seconds    Status Achieved      PT LONG TERM GOAL #3   Title walk with a cane or lessed AD x 500 feet    Status Partially Met      PT LONG TERM GOAL #4   Title get up from sitting without using hands    Baseline varies on height and surface but more consistant    Status Partially Met                   Plan - 12/01/21 1514     Clinical Impression Statement progressed gait and made it 375 feet without AD CGA with cuing for speed control. TM for 5 min for step through gait and stamina. as pt fatigues balance does decline. overall pt is progressing with increased func but stil a fall rsik esp as he fatigues    PT Treatment/Interventions ADLs/Self Care Home Management;Gait training;Neuromuscular re-education;Balance training;Therapeutic exercise;Therapeutic activities;Functional mobility training;Stair training;Patient/family education;Manual techniques    PT Next Visit Plan work on function and quality of life             Patient will benefit from skilled therapeutic intervention in order to improve the following deficits and impairments:  Abnormal gait, Decreased coordination, Decreased range of motion,  Difficulty walking, Decreased endurance, Cardiopulmonary status limiting activity, Decreased activity tolerance, Decreased balance, Improper body mechanics, Postural dysfunction, Decreased strength, Decreased mobility  Visit Diagnosis: Muscle weakness (generalized)  Difficulty in walking, not elsewhere classified     Problem List Patient Active Problem List   Diagnosis Date Noted   Prostate cancer ([place]) 09/26/2012   DYSLIPIDEMIA 05/02/2007   MORBID OBESITY 05/02/2007   OBSTRUCTIVE SLEEP APNEA 05/02/2007   HYPERTENSION 05/02/2007    Lucile Hillmann,[name], PTA 12/01/2021, 3:23 PM  [place]. [place], [place], [address] Phone: [phone]   Fax:  [phone]  Name: Daniel [name] MRN: [id number] Date of Birth: April 16, 1956

## 2021-12-06 ENCOUNTER — Encounter: Payer: Self-pay | Admitting: Physical Therapy

## 2021-12-06 ENCOUNTER — Ambulatory Visit: Payer: Medicare PPO | Admitting: Physical Therapy

## 2021-12-06 DIAGNOSIS — M6281 Muscle weakness (generalized): Secondary | ICD-10-CM

## 2021-12-06 DIAGNOSIS — R262 Difficulty in walking, not elsewhere classified: Secondary | ICD-10-CM

## 2021-12-06 NOTE — Therapy (Signed)
[address]. [place], [place], [address] Phone: [phone]   Fax:  [phone]  Physical Therapy Treatment Progress Note Reporting Period 11/03/21 to 12/06/21  See note below for Objective Data and Assessment of Progress/Goals.     Patient Details  Name: Hill Mackie MRN: [id number] Date of Birth: July 03, 1955 Referring Provider (PT): Repass   Encounter Date: 12/06/2021   PT End of Session - 12/06/21 1439     Visit Number 46    Date for PT Re-Evaluation 02/03/22    Authorization Type [employer]    Authorization Time Period 17/20 [employer]    PT Start Time 1425    PT Stop Time 1520    PT Time Calculation (min) 55 min    Equipment Utilized During Treatment Gait belt    Activity Tolerance Patient tolerated treatment well             Past Medical History:  Diagnosis Date   Arthritis    Diabetes mellitus without complication ([place])    Elevated PSA 08/29/2012   7.22   Hyperlipidemia    Hypertension    Obstructive sleep apnea    CPAP   Prostate cancer [facility]) Feb 2014   Spinal stenosis     Past Surgical History:  Procedure Laterality Date   KNEE SURGERY     torn meniscus   PILONIDAL CYST EXCISION     lower spine age 81   PROSTATE BIOPSY Bilateral 08/29/2012   gleason 3+3=6  Dr.Marc Nesi   RADIOACTIVE SEED IMPLANT N/A 11/19/2012   Procedure: RADIOACTIVE SEED IMPLANT;  Surgeon: Hanley Ben, MD;  [facility];  Service: Urology;  Laterality: N/A;   SPINAL CORD DECOMPRESSION     Spinal injection  2012   spinal stenosis   TONSILLECTOMY     as child age 21 or 5    There were no vitals filed for this visit.   Subjective Assessment - 12/06/21 1440     Subjective no falls, left knee is stiff    Currently in Pain? No/denies                               [facility] Adult PT Treatment/Exercise - 12/06/21 0001       Ambulation/Gait   Gait Comments no device CGA with gait belt 350 feet no  rest      Lumbar Exercises: Stretches   Passive Hamstring Stretch Right;Left;3 reps;20 seconds    Other Lumbar Stretch Exercise lumbar stretch with ball x 4      Lumbar Exercises: Aerobic   Tread Mill .72mph 5 min with UE needed working on fluid step through and stamina    UBE (Upper Arm Bike) Level 5 with power burst at last of every minute    Nustep L7 x8 min      Lumbar Exercises: Machines for Strengthening   Cybex Knee Extension 35lb 3x10    Cybex Knee Flexion 55lb 3x10      Lumbar Exercises: Seated   Other Seated Lumbar Exercises sit to stand with weight ball lifts                       PT Short Term Goals - 05/10/21 1505       PT SHORT TERM GOAL #1   Title give HEP that he will do at home    Status Achieved  PT Long Term Goals - 12/01/21 1449       PT LONG TERM GOAL #1   Title independent with an advanced HEP    Status Achieved      PT LONG TERM GOAL #2   Title decrease TUG time to 18 seconds    Status Achieved      PT LONG TERM GOAL #3   Title walk with a cane or lessed AD x 500 feet    Status Partially Met      PT LONG TERM GOAL #4   Title get up from sitting without using hands    Baseline varies on height and surface but more consistant    Status Partially Met                   Plan - 12/06/21 1752     Clinical Impression Statement Patient continue to progress with his functional ability, he had some posterior left knee pain today that was limiting him some, he was still able to do more walking, is more steady, at times has a little bit of control issues.  Fear is a limiter at times    PT Next Visit Plan work on function and quality of life    Consulted and Agree with Plan of Care Patient             Patient will benefit from skilled therapeutic intervention in order to improve the following deficits and impairments:  Abnormal gait, Decreased coordination, Decreased range of motion, Difficulty walking,  Decreased endurance, Cardiopulmonary status limiting activity, Decreased activity tolerance, Decreased balance, Improper body mechanics, Postural dysfunction, Decreased strength, Decreased mobility  Visit Diagnosis: Muscle weakness (generalized)  Difficulty in walking, not elsewhere classified     Problem List Patient Active Problem List   Diagnosis Date Noted   Prostate cancer ([place]) 09/26/2012   DYSLIPIDEMIA 05/02/2007   MORBID OBESITY 05/02/2007   OBSTRUCTIVE SLEEP APNEA 05/02/2007   HYPERTENSION 05/02/2007    Sumner Boast, PT 12/06/2021, 5:54 PM  [place]. [place], [place], [address] Phone: [phone]   Fax:  [phone]  Name: Nigel Ericsson MRN: [id number] Date of Birth: 07/17/1955

## 2021-12-08 ENCOUNTER — Encounter: Payer: Self-pay | Admitting: Physical Therapy

## 2021-12-08 ENCOUNTER — Ambulatory Visit: Payer: Medicare PPO | Admitting: Physical Therapy

## 2021-12-08 DIAGNOSIS — M6281 Muscle weakness (generalized): Secondary | ICD-10-CM | POA: Diagnosis not present

## 2021-12-08 DIAGNOSIS — R262 Difficulty in walking, not elsewhere classified: Secondary | ICD-10-CM

## 2021-12-08 NOTE — Therapy (Signed)
[place]. [place], [place], [address] Phone: [phone]   Fax:  [phone]  Physical Therapy Treatment  Patient Details  Name: Daniel [name] MRN: [id number] Date of Birth: [birth year] Referring Provider (PT): Repass   Encounter Date: 12/08/2021   PT End of Session - 12/08/21 1444     Visit Number 55    Date for PT Re-Evaluation 02/03/22    Authorization Type [employer]    Authorization Time Period 4/20    PT Start Time 1430    PT Stop Time 1520    PT Time Calculation (min) 50 min    Equipment Utilized During Treatment Gait belt    Activity Tolerance Patient tolerated treatment well    Behavior During Therapy [facility] for tasks assessed/performed             Past Medical History:  Diagnosis Date   Arthritis    Diabetes mellitus without complication ([place])    Elevated PSA 08/29/2012   7.22   Hyperlipidemia    Hypertension    Obstructive sleep apnea    CPAP   Prostate cancer [facility]) Feb 2014   Spinal stenosis     Past Surgical History:  Procedure Laterality Date   KNEE SURGERY     torn meniscus   PILONIDAL CYST EXCISION     lower spine age 60   PROSTATE BIOPSY Bilateral 08/29/2012   gleason 3+3=6  Dr.Marc Nesi   RADIOACTIVE SEED IMPLANT N/A 11/19/2012   Procedure: RADIOACTIVE SEED IMPLANT;  Surgeon: Hanley Ben, MD;  [facility];  Service: Urology;  Laterality: N/A;   SPINAL CORD DECOMPRESSION     Spinal injection  2012   spinal stenosis   TONSILLECTOMY     as child age 32 or 5    There were no vitals filed for this visit.   Subjective Assessment - 12/08/21 1445     Subjective Patient reports that he is having pain behind the left knee a 7/10 when going into sitting    Currently in Pain? Yes    Pain Score 6     Pain Location Knee    Pain Orientation Left;Posterior    Pain Descriptors / Indicators Sore    Aggravating Factors  walking and sitting                                [facility] Adult PT Treatment/Exercise - 12/08/21 0001       Ambulation/Gait   Gait Comments we attempted walking at the end but he was fatigued and seemed to have some fear so this was deferred      Lumbar Exercises: Aerobic   UBE (Upper Arm Bike) level 5 x 6 minutes    Nustep L7 x8 min      Lumbar Exercises: Machines for Strengthening   Cybex Knee Extension 25# 3x10    Cybex Knee Flexion 55lb 3x10    Leg Press 60# 3x10    Other Lumbar Machine Exercise straight arm extension 15# 3x20, chest press 25# 3x10, lats 45# 3x10      Lumbar Exercises: Seated   Other Seated Lumbar Exercises isometric abs      Manual Therapy   Joint Mobilization left knee    Passive ROM LE, HS,calf, add and hip flexor                       PT Short  Term Goals - 05/10/21 1505       PT SHORT TERM GOAL #1   Title give HEP that he will do at home    Status Achieved               PT Long Term Goals - 12/08/21 1621       PT LONG TERM GOAL #4   Title get up from sitting without using hands    Status Partially Met                   Plan - 12/08/21 1621     Clinical Impression Statement Patient having some posterior left knee pain, feels it with walking some but has a lot of pain with going sit to stand and stand to sit, he is non tender to this area, I did some manual joint mobs of the knee and no pain.  We did most of the exercises we normally do but the walking was not done as he was tired and had some fear    PT Next Visit Plan work on function and quality of life    Consulted and Agree with Plan of Care Patient             Patient will benefit from skilled therapeutic intervention in order to improve the following deficits and impairments:  Abnormal gait, Decreased coordination, Decreased range of motion, Difficulty walking, Decreased endurance, Cardiopulmonary status limiting activity, Decreased activity tolerance, Decreased  balance, Improper body mechanics, Postural dysfunction, Decreased strength, Decreased mobility  Visit Diagnosis: Muscle weakness (generalized)  Difficulty in walking, not elsewhere classified     Problem List Patient Active Problem List   Diagnosis Date Noted   Prostate cancer ([place]) 09/26/2012   DYSLIPIDEMIA 05/02/2007   MORBID OBESITY 05/02/2007   OBSTRUCTIVE SLEEP APNEA 05/02/2007   HYPERTENSION 05/02/2007    Sumner Boast, PT 12/08/2021, 4:24 PM  [place]. [place], [place], [address] Phone: [phone]   Fax:  [phone]  Name: Daniel [name] MRN: [id number] Date of Birth: Nov 22, 1955

## 2021-12-13 ENCOUNTER — Encounter: Payer: Self-pay | Admitting: Physical Therapy

## 2021-12-13 ENCOUNTER — Ambulatory Visit: Payer: Medicare PPO | Admitting: Physical Therapy

## 2021-12-13 DIAGNOSIS — M6281 Muscle weakness (generalized): Secondary | ICD-10-CM

## 2021-12-13 DIAGNOSIS — R262 Difficulty in walking, not elsewhere classified: Secondary | ICD-10-CM | POA: Diagnosis not present

## 2021-12-13 NOTE — Therapy (Signed)
[place]. [place], [place], [address] Phone: [phone]   Fax:  [phone]  Physical Therapy Treatment  Patient Details  Name: Daniel [name] MRN: [id number] Date of Birth: [birth year] Referring Provider (PT): Repass   Encounter Date: 12/13/2021   PT End of Session - 12/13/21 1522     Visit Number 33    Date for PT Re-Evaluation 02/03/22    Authorization Type [employer]    PT Start Time 1416    PT Stop Time 1511    PT Time Calculation (min) 55 min    Equipment Utilized During Treatment Gait belt    Activity Tolerance Patient tolerated treatment well    Behavior During Therapy [facility] for tasks assessed/performed             Past Medical History:  Diagnosis Date   Arthritis    Diabetes mellitus without complication ([place])    Elevated PSA 08/29/2012   7.22   Hyperlipidemia    Hypertension    Obstructive sleep apnea    CPAP   Prostate cancer [facility]) Feb 2014   Spinal stenosis     Past Surgical History:  Procedure Laterality Date   KNEE SURGERY     torn meniscus   PILONIDAL CYST EXCISION     lower spine age 73   PROSTATE BIOPSY Bilateral 08/29/2012   gleason 3+3=6  Dr.Marc Nesi   RADIOACTIVE SEED IMPLANT N/A 11/19/2012   Procedure: RADIOACTIVE SEED IMPLANT;  Surgeon: Hanley Ben, MD;  [facility];  Service: Urology;  Laterality: N/A;   SPINAL CORD DECOMPRESSION     Spinal injection  2012   spinal stenosis   TONSILLECTOMY     as child age 86 or 5    There were no vitals filed for this visit.   Subjective Assessment - 12/13/21 1416     Subjective Feel good, Little tenderness behind the L knee.    Currently in Pain? No/denies                               [facility] Adult PT Treatment/Exercise - 12/13/21 0001       Ambulation/Gait   Gait Comments no device with gait belt CGA x 375 feet with some speed control issues as he gets going to fast      Lumbar Exercises: Stretches    Passive Hamstring Stretch Right;Left;3 reps;20 seconds    Other Lumbar Stretch Exercise lumbar stretch with ball x 4      Lumbar Exercises: Aerobic   UBE (Upper Arm Bike) level 5 x 6 minutes    Nustep L7 x8 min      Lumbar Exercises: Machines for Strengthening   Other Lumbar Machine Exercise straight arm extension 15# 2x15, chest press 35# 3x10, lats 55# 3x10, Rows 55lb 2x15      Lumbar Exercises: Standing   Other Standing Lumbar Exercises OHP 4lb 2x10      Lumbar Exercises: Seated   Sit to Stand 10 reps   x2 holding blue ball                      PT Short Term Goals - 05/10/21 1505       PT SHORT TERM GOAL #1   Title give HEP that he will do at home    Status Achieved  PT Long Term Goals - 12/08/21 1621       PT LONG TERM GOAL #4   Title get up from sitting without using hands    Status Partially Met                   Plan - 12/13/21 1523     Clinical Impression Statement Pt enter feeling well reporting less pain. He did ell with gait increasing his distance. Some fatigue present towards the end of gait trial. Increase weight tolerated with rows, lats, and chest press.  Some instability present with sit to stands. Cue to prevent trunk sway needed with shoulder Ext.    Rehab Potential Good    PT Frequency 2x / week    PT Duration 12 weeks    PT Treatment/Interventions ADLs/Self Care Home Management;Gait training;Neuromuscular re-education;Balance training;Therapeutic exercise;Therapeutic activities;Functional mobility training;Stair training;Patient/family education;Manual techniques    PT Next Visit Plan work on function and quality of life             Patient will benefit from skilled therapeutic intervention in order to improve the following deficits and impairments:  Abnormal gait, Decreased coordination, Decreased range of motion, Difficulty walking, Decreased endurance, Cardiopulmonary status limiting activity,  Decreased activity tolerance, Decreased balance, Improper body mechanics, Postural dysfunction, Decreased strength, Decreased mobility  Visit Diagnosis: Muscle weakness (generalized)  Difficulty in walking, not elsewhere classified     Problem List Patient Active Problem List   Diagnosis Date Noted   Prostate cancer ([place]) 09/26/2012   DYSLIPIDEMIA 05/02/2007   MORBID OBESITY 05/02/2007   OBSTRUCTIVE SLEEP APNEA 05/02/2007   HYPERTENSION 05/02/2007    [name], PTA 12/13/2021, 3:25 PM  [place]. [place], [place], [address] Phone: [phone]   Fax:  [phone]  Name: Daniel [name] MRN: [id number] Date of Birth: Dec 18, 1955

## 2021-12-15 ENCOUNTER — Encounter: Payer: Self-pay | Admitting: Physical Therapy

## 2021-12-15 ENCOUNTER — Ambulatory Visit: Payer: Medicare PPO | Admitting: Physical Therapy

## 2021-12-15 DIAGNOSIS — M6281 Muscle weakness (generalized): Secondary | ICD-10-CM | POA: Diagnosis not present

## 2021-12-15 DIAGNOSIS — R262 Difficulty in walking, not elsewhere classified: Secondary | ICD-10-CM | POA: Diagnosis not present

## 2021-12-15 NOTE — Therapy (Signed)
[address]. [place], [place], [address] Phone: [phone]   Fax:  [phone]  Physical Therapy Treatment  Patient Details  Name: Daniel [name] MRN: [id number] Date of Birth: [birth year] Referring Provider (PT): Repass   Encounter Date: 12/15/2021   PT End of Session - 12/15/21 1511     Visit Number 22    Date for PT Re-Evaluation 02/03/22    PT Start Time 1416    PT Stop Time 1511    PT Time Calculation (min) 55 min    Activity Tolerance Patient tolerated treatment well             Past Medical History:  Diagnosis Date   Arthritis    Diabetes mellitus without complication ([place])    Elevated PSA 08/29/2012   7.22   Hyperlipidemia    Hypertension    Obstructive sleep apnea    CPAP   Prostate cancer [facility]) Feb 2014   Spinal stenosis     Past Surgical History:  Procedure Laterality Date   KNEE SURGERY     torn meniscus   PILONIDAL CYST EXCISION     lower spine age 42   PROSTATE BIOPSY Bilateral 08/29/2012   gleason 3+3=6  Dr.Marc Nesi   RADIOACTIVE SEED IMPLANT N/A 11/19/2012   Procedure: RADIOACTIVE SEED IMPLANT;  Surgeon: Hanley Ben, MD;  [facility];  Service: Urology;  Laterality: N/A;   SPINAL CORD DECOMPRESSION     Spinal injection  2012   spinal stenosis   TONSILLECTOMY     as child age 56 or 5    There were no vitals filed for this visit.   Subjective Assessment - 12/15/21 1431     Subjective Pretty good    Currently in Pain? No/denies                               [facility] Adult PT Treatment/Exercise - 12/15/21 0001       Ambulation/Gait   Gait Comments no device with gait belt CGA x 375 feet with some speed control issues as he gets going to fast      Lumbar Exercises: Stretches   Passive Hamstring Stretch Right;Left;3 reps;20 seconds      Lumbar Exercises: Aerobic   UBE (Upper Arm Bike) level 5 x 6 minutes    Nustep L7 x8 min      Lumbar  Exercises: Machines for Strengthening   Other Lumbar Machine Exercise Rows & Lats 55lb 3x10      Lumbar Exercises: Standing   Other Standing Lumbar Exercises Sit to stands with blue ball super set with slams using blue ball 2x10    Other Standing Lumbar Exercises OHP 4lb 2x10                       PT Short Term Goals - 05/10/21 1505       PT SHORT TERM GOAL #1   Title give HEP that he will do at home    Status Achieved               PT Long Term Goals - 12/08/21 1621       PT LONG TERM GOAL #4   Title get up from sitting without using hands    Status Partially Met                   Plan -  12/15/21 1512     Clinical Impression Statement Pt did well during today's session continues to fatigue quick with gait trial. Pt stated his balance is the first thing to go when he is tired. Pushed functional endurance with super sets of sit to stands and ball slams. Cue needed to prevent postural sway with seated rows and lats.    Rehab Potential Good    PT Frequency 2x / week    PT Duration 12 weeks    PT Treatment/Interventions ADLs/Self Care Home Management;Gait training;Neuromuscular re-education;Balance training;Therapeutic exercise;Therapeutic activities;Functional mobility training;Stair training;Patient/family education;Manual techniques    PT Next Visit Plan work on function and quality of life             Patient will benefit from skilled therapeutic intervention in order to improve the following deficits and impairments:  Abnormal gait, Decreased coordination, Decreased range of motion, Difficulty walking, Decreased endurance, Cardiopulmonary status limiting activity, Decreased activity tolerance, Decreased balance, Improper body mechanics, Postural dysfunction, Decreased strength, Decreased mobility  Visit Diagnosis: Muscle weakness (generalized)  Difficulty in walking, not elsewhere classified     Problem List Patient Active Problem List    Diagnosis Date Noted   Prostate cancer ([place]) 09/26/2012   DYSLIPIDEMIA 05/02/2007   MORBID OBESITY 05/02/2007   OBSTRUCTIVE SLEEP APNEA 05/02/2007   HYPERTENSION 05/02/2007    [name], PTA 12/15/2021, 3:14 PM  [place]. [place], [place], [address] Phone: [phone]   Fax:  [phone]  Name: Daniel [name] MRN: [id number] Date of Birth: 08-22-1955

## 2021-12-20 ENCOUNTER — Encounter: Payer: Self-pay | Admitting: Physical Therapy

## 2021-12-20 ENCOUNTER — Ambulatory Visit: Payer: Medicare PPO | Admitting: Physical Therapy

## 2021-12-20 DIAGNOSIS — R262 Difficulty in walking, not elsewhere classified: Secondary | ICD-10-CM

## 2021-12-20 DIAGNOSIS — M6281 Muscle weakness (generalized): Secondary | ICD-10-CM

## 2021-12-20 NOTE — Therapy (Signed)
[place]. [place], [place], [address] Phone: [phone]   Fax:  [phone]  Physical Therapy Treatment  Patient Details  Name: Daniel [name] MRN: [id number] Date of Birth: 04-29-1955 Referring Provider (PT): Repass   Encounter Date: 12/20/2021   PT End of Session - 12/20/21 1427     Visit Number 11    Date for PT Re-Evaluation 02/03/22    Authorization Type [employer]    Authorization Time Period 7/20    PT Start Time 1428    PT Stop Time 1530    PT Time Calculation (min) 62 min    Equipment Utilized During Treatment Gait belt    Activity Tolerance Patient tolerated treatment well    Behavior During Therapy [facility] for tasks assessed/performed             Past Medical History:  Diagnosis Date   Arthritis    Diabetes mellitus without complication ([place])    Elevated PSA 08/29/2012   7.22   Hyperlipidemia    Hypertension    Obstructive sleep apnea    CPAP   Prostate cancer [facility]) Feb 2014   Spinal stenosis     Past Surgical History:  Procedure Laterality Date   KNEE SURGERY     torn meniscus   PILONIDAL CYST EXCISION     lower spine age 23   PROSTATE BIOPSY Bilateral 08/29/2012   gleason 3+3=6  Dr.Marc Nesi   RADIOACTIVE SEED IMPLANT N/A 11/19/2012   Procedure: RADIOACTIVE SEED IMPLANT;  Surgeon: Hanley Ben, MD;  Location: [facility];  Service: Urology;  Laterality: N/A;   SPINAL CORD DECOMPRESSION     Spinal injection  2012   spinal stenosis   TONSILLECTOMY     as child age 13 or 5    There were no vitals filed for this visit.   Subjective Assessment - 12/20/21 1427     Subjective No stumbles, feels like I am doing better, that tendon just slowed me down for awhile    Currently in Pain? No/denies                               [facility] Adult PT Treatment/Exercise - 12/20/21 0001       Ambulation/Gait   Gait Comments no device, gait belt 4 laps, 460 feet, then did 240 feet       Lumbar Exercises: Stretches   Passive Hamstring Stretch Right;Left;3 reps;20 seconds    Other Lumbar Stretch Exercise lumbar stretch with ball x 4      Lumbar Exercises: Aerobic   UBE (Upper Arm Bike) level 5 x 6 minutes power burst last 10 seconds up to 300 watts 5 out of 6 times    Nustep L7 x8 min      Lumbar Exercises: Machines for Strengthening   Cybex Knee Extension 35# 2x10    Cybex Knee Flexion 55lb 3x10    Leg Press 90# 3x10      Lumbar Exercises: Standing   Other Standing Lumbar Exercises sit to stand with the big 20# medicine ball slams, unable to catch it 2x5                       PT Short Term Goals - 05/10/21 1505       PT SHORT TERM GOAL #1   Title give HEP that he will do at home    Status Achieved  PT Long Term Goals - 12/20/21 1702       PT LONG TERM GOAL #3   Title walk with a cane or lessed AD x 500 feet    Status Partially Met                   Plan - 12/20/21 1703     Clinical Impression Statement Added more walking today, this was the furthest he has been able to do and he did well, fatigue and minor loss of control but was able to regain control without outside forces.  I did add some heavier weight and activities and he did well, The biggest issue is carryover at home previously but this was mostly due to fear of falling and not being able to get up, i feel that he is walking a little more at home as we are seeing carry over, he was very debilitated when we started and really is making good progress toward higher functional level.    PT Next Visit Plan work on function and quality of life    Consulted and Agree with Plan of Care Patient             Patient will benefit from skilled therapeutic intervention in order to improve the following deficits and impairments:  Abnormal gait, Decreased coordination, Decreased range of motion, Difficulty walking, Decreased endurance, Cardiopulmonary status  limiting activity, Decreased activity tolerance, Decreased balance, Improper body mechanics, Postural dysfunction, Decreased strength, Decreased mobility  Visit Diagnosis: Muscle weakness (generalized)  Difficulty in walking, not elsewhere classified     Problem List Patient Active Problem List   Diagnosis Date Noted   Prostate cancer ([place]) 09/26/2012   DYSLIPIDEMIA 05/02/2007   MORBID OBESITY 05/02/2007   OBSTRUCTIVE SLEEP APNEA 05/02/2007   HYPERTENSION 05/02/2007    Sumner Boast, PT 12/20/2021, 5:05 PM  [place]. [place], [place], [address] Phone: [phone]   Fax:  [phone]  Name: Daniel [name] MRN: [id number] Date of Birth: Sep 15, 1955

## 2021-12-22 ENCOUNTER — Encounter: Payer: Self-pay | Admitting: Physical Therapy

## 2021-12-22 ENCOUNTER — Ambulatory Visit: Payer: Medicare PPO | Admitting: Physical Therapy

## 2021-12-22 DIAGNOSIS — M6281 Muscle weakness (generalized): Secondary | ICD-10-CM

## 2021-12-22 DIAGNOSIS — R262 Difficulty in walking, not elsewhere classified: Secondary | ICD-10-CM

## 2021-12-22 NOTE — Therapy (Signed)
[place]. [place], [place], [address] Phone: [phone]   Fax:  [phone]  Physical Therapy Treatment  Patient Details  Name: Daniel [name] MRN: [id number] Date of Birth: 09/12/1955 Referring Provider (PT): Repass   Encounter Date: 12/22/2021   PT End of Session - 12/22/21 1510     Visit Number 64    Date for PT Re-Evaluation 02/03/22    PT Start Time 1430    PT Stop Time 1510    PT Time Calculation (min) 40 min    Activity Tolerance Patient tolerated treatment well    Behavior During Therapy [facility] for tasks assessed/performed             Past Medical History:  Diagnosis Date   Arthritis    Diabetes mellitus without complication ([place])    Elevated PSA 08/29/2012   7.22   Hyperlipidemia    Hypertension    Obstructive sleep apnea    CPAP   Prostate cancer [facility]) Feb 2014   Spinal stenosis     Past Surgical History:  Procedure Laterality Date   KNEE SURGERY     torn meniscus   PILONIDAL CYST EXCISION     lower spine age 34   PROSTATE BIOPSY Bilateral 08/29/2012   gleason 3+3=6  Dr.Marc Nesi   RADIOACTIVE SEED IMPLANT N/A 11/19/2012   Procedure: RADIOACTIVE SEED IMPLANT;  Surgeon: Hanley Ben, MD;  [facility];  Service: Urology;  Laterality: N/A;   SPINAL CORD DECOMPRESSION     Spinal injection  2012   spinal stenosis   TONSILLECTOMY     as child age 70 or 5    There were no vitals filed for this visit.   Subjective Assessment - 12/22/21 1427     Subjective "I feel pretty good"    Currently in Pain? No/denies                               [facility] Adult PT Treatment/Exercise - 12/22/21 0001       Ambulation/Gait   Gait Comments no device, gait belt 4 laps, 460 feet, then did 240 feet      Lumbar Exercises: Aerobic   Nustep L7 x8 min      Lumbar Exercises: Machines for Strengthening   Cybex Knee Extension 35# 3x10    Cybex Knee Flexion 55lb 3x10    Other  Lumbar Machine Exercise Rows & Lats 55lb 2x10                       PT Short Term Goals - 05/10/21 1505       PT SHORT TERM GOAL #1   Title give HEP that he will do at home    Status Achieved               PT Long Term Goals - 12/20/21 1702       PT LONG TERM GOAL #3   Title walk with a cane or lessed AD x 500 feet    Status Partially Met                   Plan - 12/22/21 1511     Clinical Impression Statement Pt enters feeling well. He has good carryover form last session able to tolerated the added gait distance. Again he has some minor losses of control more so when he fatigues, but again  able to correct himself. Cue needed to slow down when performing seated rows and lats. Pt able to mobilize around clinic between machines without  AD.    Rehab Potential Good    PT Frequency 2x / week    PT Duration 12 weeks    PT Treatment/Interventions Gait training;Neuromuscular re-education;Balance training;Therapeutic exercise;Therapeutic activities;Functional mobility training;Stair training;Patient/family education;Manual techniques    PT Next Visit Plan work on function and quality of life             Patient will benefit from skilled therapeutic intervention in order to improve the following deficits and impairments:  Abnormal gait, Decreased coordination, Decreased range of motion, Difficulty walking, Decreased endurance, Cardiopulmonary status limiting activity, Decreased activity tolerance, Decreased balance, Improper body mechanics, Postural dysfunction, Decreased strength, Decreased mobility  Visit Diagnosis: Difficulty in walking, not elsewhere classified  Muscle weakness (generalized)     Problem List Patient Active Problem List   Diagnosis Date Noted   Prostate cancer ([place]) 09/26/2012   DYSLIPIDEMIA 05/02/2007   MORBID OBESITY 05/02/2007   OBSTRUCTIVE SLEEP APNEA 05/02/2007   HYPERTENSION 05/02/2007    [name],  PTA 12/22/2021, 3:14 PM  [place]. [place], [place], [address] Phone: [phone]   Fax:  [phone]  Name: Daniel [name] MRN: [id number] Date of Birth: [birth year]

## 2021-12-27 ENCOUNTER — Ambulatory Visit: Payer: Medicare PPO | Attending: Internal Medicine | Admitting: Physical Therapy

## 2021-12-27 ENCOUNTER — Encounter: Payer: Self-pay | Admitting: Physical Therapy

## 2021-12-27 DIAGNOSIS — R262 Difficulty in walking, not elsewhere classified: Secondary | ICD-10-CM | POA: Diagnosis not present

## 2021-12-27 DIAGNOSIS — M6281 Muscle weakness (generalized): Secondary | ICD-10-CM | POA: Diagnosis not present

## 2021-12-27 NOTE — Therapy (Signed)
[place]. [place], [place], [address] Phone: [phone]   Fax:  [phone]  Physical Therapy Treatment  Patient Details  Name: Daniel [name] MRN: [id number] Date of Birth: May 04, 1955 Referring Provider (PT): Repass   Encounter Date: 12/27/2021   PT End of Session - 12/27/21 1515     Visit Number 108    Date for PT Re-Evaluation 02/03/22    PT Start Time 1418    PT Stop Time 1515    PT Time Calculation (min) 57 min    Activity Tolerance Patient tolerated treatment well    Behavior During Therapy [facility] for tasks assessed/performed             Past Medical History:  Diagnosis Date   Arthritis    Diabetes mellitus without complication ([place])    Elevated PSA 08/29/2012   7.22   Hyperlipidemia    Hypertension    Obstructive sleep apnea    CPAP   Prostate cancer [facility]) Feb 2014   Spinal stenosis     Past Surgical History:  Procedure Laterality Date   KNEE SURGERY     torn meniscus   PILONIDAL CYST EXCISION     lower spine age 66   PROSTATE BIOPSY Bilateral 08/29/2012   gleason 3+3=6  Dr.Marc Nesi   RADIOACTIVE SEED IMPLANT N/A 11/19/2012   Procedure: RADIOACTIVE SEED IMPLANT;  Surgeon: Hanley Ben, MD;  [facility];  Service: Urology;  Laterality: N/A;   SPINAL CORD DECOMPRESSION     Spinal injection  2012   spinal stenosis   TONSILLECTOMY     as child age 39 or 5    There were no vitals filed for this visit.   Subjective Assessment - 12/27/21 1431     Subjective Good, slight tenderness in L knee that started this morning    Currently in Pain? No/denies                               [facility] Adult PT Treatment/Exercise - 12/27/21 0001       Ambulation/Gait   Gait Comments no device, gait belt 4 laps, 460 feet, then did 330 feet      Lumbar Exercises: Stretches   Other Lumbar Stretch Exercise lumbar stretch with ball x 4      Lumbar Exercises: Aerobic   UBE  (Upper Arm Bike) level 5 x 6 minutes power burst last 10 seconds up to 300 watts 5 out of 6 times    Nustep L7 x8 min      Lumbar Exercises: Machines for Strengthening   Cybex Knee Extension 35# 2x12    Cybex Knee Flexion 65lb 2x10    Other Lumbar Machine Exercise Rows & Lats 65lb 2x10                       PT Short Term Goals - 05/10/21 1505       PT SHORT TERM GOAL #1   Title give HEP that he will do at home    Status Achieved               PT Long Term Goals - 12/20/21 1702       PT LONG TERM GOAL #3   Title walk with a cane or lessed AD x 500 feet    Status Partially Met  Plan - 12/27/21 1516     Clinical Impression Statement Pt enters with some L knee tenderness. Increase distance tolerated on second gait trial. Some LE tightness and reports of cramping sensation during gait trial. Increase resistance tolerated with all machine interventions    Rehab Potential Good    PT Frequency 2x / week    PT Duration 12 weeks    PT Treatment/Interventions Gait training;Neuromuscular re-education;Balance training;Therapeutic exercise;Therapeutic activities;Functional mobility training;Stair training;Patient/family education;Manual techniques    PT Next Visit Plan work on function and quality of life             Patient will benefit from skilled therapeutic intervention in order to improve the following deficits and impairments:  Abnormal gait, Decreased coordination, Decreased range of motion, Difficulty walking, Decreased endurance, Cardiopulmonary status limiting activity, Decreased activity tolerance, Decreased balance, Improper body mechanics, Postural dysfunction, Decreased strength, Decreased mobility  Visit Diagnosis: Difficulty in walking, not elsewhere classified  Muscle weakness (generalized)     Problem List Patient Active Problem List   Diagnosis Date Noted   Prostate cancer ([place]) 09/26/2012   DYSLIPIDEMIA  05/02/2007   MORBID OBESITY 05/02/2007   OBSTRUCTIVE SLEEP APNEA 05/02/2007   HYPERTENSION 05/02/2007    [name], PTA 12/27/2021, 3:20 PM  [place]. [place], [place], [address] Phone: [phone]   Fax:  [phone]  Name: Miko Sirico MRN: [id number] Date of Birth: 1955/09/23

## 2021-12-29 ENCOUNTER — Encounter: Payer: Self-pay | Admitting: Physical Therapy

## 2021-12-29 ENCOUNTER — Ambulatory Visit: Payer: Medicare PPO | Admitting: Physical Therapy

## 2021-12-29 DIAGNOSIS — R262 Difficulty in walking, not elsewhere classified: Secondary | ICD-10-CM | POA: Diagnosis not present

## 2021-12-29 DIAGNOSIS — M6281 Muscle weakness (generalized): Secondary | ICD-10-CM | POA: Diagnosis not present

## 2021-12-29 NOTE — Therapy (Signed)
[place]. [place], [place], [address] Phone: [phone]   Fax:  [phone]  Physical Therapy Treatment  Patient Details  Name: Daniel [name] MRN: [id number] Date of Birth: 30-Sep-1955 Referring Provider (PT): Repass   Encounter Date: 12/29/2021   PT End of Session - 12/29/21 1437     Visit Number 65    Date for PT Re-Evaluation 02/03/22    Authorization Type [employer]    Authorization Time Period 10/20    PT Start Time 1430    PT Stop Time 1530    PT Time Calculation (min) 60 min    Equipment Utilized During Treatment Gait belt    Activity Tolerance Patient tolerated treatment well    Behavior During Therapy [facility] for tasks assessed/performed             Past Medical History:  Diagnosis Date   Arthritis    Diabetes mellitus without complication ([place])    Elevated PSA 08/29/2012   7.22   Hyperlipidemia    Hypertension    Obstructive sleep apnea    CPAP   Prostate cancer [facility]) Feb 2014   Spinal stenosis     Past Surgical History:  Procedure Laterality Date   KNEE SURGERY     torn meniscus   PILONIDAL CYST EXCISION     lower spine age 53   PROSTATE BIOPSY Bilateral 08/29/2012   gleason 3+3=6  Dr.Marc Nesi   RADIOACTIVE SEED IMPLANT N/A 11/19/2012   Procedure: RADIOACTIVE SEED IMPLANT;  Surgeon: Hanley Ben, MD;  [facility];  Service: Urology;  Laterality: N/A;   SPINAL CORD DECOMPRESSION     Spinal injection  2012   spinal stenosis   TONSILLECTOMY     as child age 55 or 5    There were no vitals filed for this visit.   Subjective Assessment - 12/29/21 1438     Subjective Fatigue and a little fear right now seem to limit him some    Currently in Pain? No/denies                               [facility] Adult PT Treatment/Exercise - 12/29/21 0001       Ambulation/Gait   Gait Comments gait no device 330 and then grabbed walker and did another 330 without rest. we did  this 2x      Lumbar Exercises: Stretches   Passive Hamstring Stretch Right;Left;3 reps;20 seconds    Other Lumbar Stretch Exercise lumbar stretch with ball x 4      Lumbar Exercises: Aerobic   UBE (Upper Arm Bike) level 5 x 6 minutes power burst last 10 seconds up to 300 watts 5 out of 6 times    Nustep L7 x8 min                       PT Short Term Goals - 05/10/21 1505       PT SHORT TERM GOAL #1   Title give HEP that he will do at home    Status Achieved               PT Long Term Goals - 12/20/21 1702       PT LONG TERM GOAL #3   Title walk with a cane or lessed AD x 500 feet    Status Partially Met  Plan - 12/29/21 1522     Clinical Impression Statement WE did a hybrid walk today, did 3 laps without device and then 3 laps with FWW this was to push throught eh psychological barrier of the distance and get his mms used to going further, we did this twice, he needed some encouragement to do this but was able to do    PT Next Visit Plan continue to pushe hte endurance and function    Consulted and Agree with Plan of Care Patient             Patient will benefit from skilled therapeutic intervention in order to improve the following deficits and impairments:  Abnormal gait, Decreased coordination, Decreased range of motion, Difficulty walking, Decreased endurance, Cardiopulmonary status limiting activity, Decreased activity tolerance, Decreased balance, Improper body mechanics, Postural dysfunction, Decreased strength, Decreased mobility  Visit Diagnosis: Difficulty in walking, not elsewhere classified  Muscle weakness (generalized)     Problem List Patient Active Problem List   Diagnosis Date Noted   Prostate cancer ([place]) 09/26/2012   DYSLIPIDEMIA 05/02/2007   MORBID OBESITY 05/02/2007   OBSTRUCTIVE SLEEP APNEA 05/02/2007   HYPERTENSION 05/02/2007    Sumner Boast, PT 12/29/2021, 3:24 PM  [place]. [place], [place], [address] Phone: [phone]   Fax:  [phone]  Name: Jaysion Ramseyer MRN: [id number] Date of Birth: Aug 29, 1955

## 2022-01-02 DIAGNOSIS — E782 Mixed hyperlipidemia: Secondary | ICD-10-CM | POA: Diagnosis not present

## 2022-01-02 DIAGNOSIS — E1165 Type 2 diabetes mellitus with hyperglycemia: Secondary | ICD-10-CM | POA: Diagnosis not present

## 2022-01-02 DIAGNOSIS — M170 Bilateral primary osteoarthritis of knee: Secondary | ICD-10-CM | POA: Diagnosis not present

## 2022-01-02 DIAGNOSIS — I82511 Chronic embolism and thrombosis of right femoral vein: Secondary | ICD-10-CM | POA: Diagnosis not present

## 2022-01-02 DIAGNOSIS — N529 Male erectile dysfunction, unspecified: Secondary | ICD-10-CM | POA: Diagnosis not present

## 2022-01-02 DIAGNOSIS — I10 Essential (primary) hypertension: Secondary | ICD-10-CM | POA: Diagnosis not present

## 2022-01-03 ENCOUNTER — Encounter: Payer: Self-pay | Admitting: Physical Therapy

## 2022-01-03 ENCOUNTER — Ambulatory Visit: Payer: Medicare PPO | Admitting: Physical Therapy

## 2022-01-03 DIAGNOSIS — R262 Difficulty in walking, not elsewhere classified: Secondary | ICD-10-CM | POA: Diagnosis not present

## 2022-01-03 DIAGNOSIS — M6281 Muscle weakness (generalized): Secondary | ICD-10-CM

## 2022-01-03 NOTE — Therapy (Signed)
[place]. [place], [place], [address] Phone: [phone]   Fax:  [phone]  Physical Therapy Treatment  Patient Details  Name: Daniel [name] MRN: [id number] Date of Birth: [birth year] Referring Provider (PT): Repass   Encounter Date: 01/03/2022   PT End of Session - 01/03/22 1445     Visit Number 8    Date for PT Re-Evaluation 02/03/22    Authorization Type [employer]    Authorization Time Period 11/20    PT Start Time 1430    PT Stop Time 1530    PT Time Calculation (min) 60 min    Equipment Utilized During Treatment Gait belt    Activity Tolerance Patient tolerated treatment well    Behavior During Therapy [facility] for tasks assessed/performed             Past Medical History:  Diagnosis Date   Arthritis    Diabetes mellitus without complication ([place])    Elevated PSA 08/29/2012   7.22   Hyperlipidemia    Hypertension    Obstructive sleep apnea    CPAP   Prostate cancer [facility]) Feb 2014   Spinal stenosis     Past Surgical History:  Procedure Laterality Date   KNEE SURGERY     torn meniscus   PILONIDAL CYST EXCISION     lower spine age 5   PROSTATE BIOPSY Bilateral 08/29/2012   gleason 3+3=6  Dr.Marc Nesi   RADIOACTIVE SEED IMPLANT N/A 11/19/2012   Procedure: RADIOACTIVE SEED IMPLANT;  Surgeon: Hanley Ben, MD;  [facility];  Service: Urology;  Laterality: N/A;   SPINAL CORD DECOMPRESSION     Spinal injection  2012   spinal stenosis   TONSILLECTOMY     as child age 71 or 5    There were no vitals filed for this visit.   Subjective Assessment - 01/03/22 1445     Subjective Pretty good, been feeling better    Currently in Pain? No/denies                               [facility] Adult PT Treatment/Exercise - 01/03/22 0001       Ambulation/Gait   Gait Comments gait 3 laps no device and then without rest FWW x 2 laps, total 700' x 6 minutes, did this again and it took  10 minutes      Lumbar Exercises: Stretches   Passive Hamstring Stretch Right;Left;3 reps;20 seconds    Other Lumbar Stretch Exercise lumbar stretch with ball x 4      Lumbar Exercises: Aerobic   UBE (Upper Arm Bike) level 5 x 6 minutes power burst last 10 seconds up to 300 watts 5 out of 6 times    Nustep L7 x9 min      Lumbar Exercises: Machines for Strengthening   Other Lumbar Machine Exercise Rows & Lats 65lb 2x10                       PT Short Term Goals - 05/10/21 1505       PT SHORT TERM GOAL #1   Title give HEP that he will do at home    Status Achieved               PT Long Term Goals - 12/20/21 1702       PT LONG TERM GOAL #3   Title walk with  a cane or lessed AD x 500 feet    Status Partially Met                   Plan - 01/03/22 1519     Clinical Impression Statement Continue to push his walking and trying to get him to go further and push himself, his walking causes tightness in teh HS, buttocks and the back, the second attempt today definitely was much slower.  He is giving good effort. was able to do machines after the walking today    PT Next Visit Plan continue to pushe hte endurance and function    Consulted and Agree with Plan of Care Patient             Patient will benefit from skilled therapeutic intervention in order to improve the following deficits and impairments:  Abnormal gait, Decreased coordination, Decreased range of motion, Difficulty walking, Decreased endurance, Cardiopulmonary status limiting activity, Decreased activity tolerance, Decreased balance, Improper body mechanics, Postural dysfunction, Decreased strength, Decreased mobility  Visit Diagnosis: Difficulty in walking, not elsewhere classified  Muscle weakness (generalized)     Problem List Patient Active Problem List   Diagnosis Date Noted   Prostate cancer ([place]) 09/26/2012   DYSLIPIDEMIA 05/02/2007   MORBID OBESITY 05/02/2007    OBSTRUCTIVE SLEEP APNEA 05/02/2007   HYPERTENSION 05/02/2007    Sumner Boast, PT 01/03/2022, 3:27 PM  [place]. [place], [place], [address] Phone: [phone]   Fax:  [phone]  Name: Dmario Russom MRN: [id number] Date of Birth: 1955-10-25

## 2022-01-05 ENCOUNTER — Encounter: Payer: Self-pay | Admitting: Physical Therapy

## 2022-01-05 ENCOUNTER — Ambulatory Visit: Payer: Medicare PPO | Admitting: Physical Therapy

## 2022-01-05 DIAGNOSIS — M6281 Muscle weakness (generalized): Secondary | ICD-10-CM | POA: Diagnosis not present

## 2022-01-05 DIAGNOSIS — R262 Difficulty in walking, not elsewhere classified: Secondary | ICD-10-CM | POA: Diagnosis not present

## 2022-01-05 NOTE — Therapy (Signed)
[place]. [place], [place], [address] Phone: [phone]   Fax:  [phone]  Physical Therapy Treatment  Patient Details  Name: Daniel [name] MRN: [id number] Date of Birth: 1955-05-07 Referring Provider (PT): Repass   Encounter Date: 01/05/2022   PT End of Session - 01/05/22 1507     Visit Number 9    Date for PT Re-Evaluation 02/03/22    PT Start Time 3009    PT Stop Time 2330    PT Time Calculation (min) 60 min    Behavior During Therapy [facility] for tasks assessed/performed             Past Medical History:  Diagnosis Date   Arthritis    Diabetes mellitus without complication ([place])    Elevated PSA 08/29/2012   7.22   Hyperlipidemia    Hypertension    Obstructive sleep apnea    CPAP   Prostate cancer [facility]) Feb 2014   Spinal stenosis     Past Surgical History:  Procedure Laterality Date   KNEE SURGERY     torn meniscus   PILONIDAL CYST EXCISION     lower spine age 61   PROSTATE BIOPSY Bilateral 08/29/2012   gleason 3+3=6  Dr.Marc Nesi   RADIOACTIVE SEED IMPLANT N/A 11/19/2012   Procedure: RADIOACTIVE SEED IMPLANT;  Surgeon: Hanley Ben, MD;  Location: [facility];  Service: Urology;  Laterality: N/A;   SPINAL CORD DECOMPRESSION     Spinal injection  2012   spinal stenosis   TONSILLECTOMY     as child age 79 or 5    There were no vitals filed for this visit.   Subjective Assessment - 01/05/22 1418     Subjective "Feeling pretty good, A little tightness it he back"    Currently in Pain? No/denies                               [facility] Adult PT Treatment/Exercise - 01/05/22 0001       Ambulation/Gait   Gait Comments gait 3 laps no device and then without rest FWW x 4 laps, total 940'. did this again and it took 10 minutes      Lumbar Exercises: Stretches   Passive Hamstring Stretch Right;Left;3 reps;20 seconds    Other Lumbar Stretch Exercise lumbar stretch with ball x  4      Lumbar Exercises: Aerobic   UBE (Upper Arm Bike) level 5 x 6 minutes power burst last 10 seconds up to 300 watts 5 out of 6 times    Nustep L7 x 8 min      Lumbar Exercises: Machines for Strengthening   Cybex Knee Extension 35# 2x12    Cybex Knee Flexion 65lb 2x10    Leg Press 120lb 2x15   Other Lumbar Machine Exercise Rows & Lats 65lb 2x15, Chest press 35lb 2x15                       PT Short Term Goals - 05/10/21 1505       PT SHORT TERM GOAL #1   Title give HEP that he will do at home    Status Achieved               PT Long Term Goals - 12/20/21 1702       PT LONG TERM GOAL #3   Title walk with a cane or  lessed AD x 500 feet    Status Partially Met                   Plan - 01/05/22 1507     Clinical Impression Statement Pt enters doing well. continues to push walking attempting to increase his functional endurance. No pain reported during gait but did have some LE and low back tightness. Good effort given throughout session. Increase resistance tolerated on leg press. Additional reps tolerated on rows and lats.    Rehab Potential Good    PT Frequency 2x / week    PT Duration 12 weeks    PT Treatment/Interventions Gait training;Neuromuscular re-education;Balance training;Therapeutic exercise;Therapeutic activities;Functional mobility training;Stair training;Patient/family education;Manual techniques    PT Next Visit Plan continue to pushe hte endurance and function             Patient will benefit from skilled therapeutic intervention in order to improve the following deficits and impairments:  Abnormal gait, Decreased coordination, Decreased range of motion, Difficulty walking, Decreased endurance, Cardiopulmonary status limiting activity, Decreased activity tolerance, Decreased balance, Improper body mechanics, Postural dysfunction, Decreased strength, Decreased mobility  Visit Diagnosis: Muscle weakness  (generalized)  Difficulty in walking, not elsewhere classified     Problem List Patient Active Problem List   Diagnosis Date Noted   Prostate cancer ([place]) 09/26/2012   DYSLIPIDEMIA 05/02/2007   MORBID OBESITY 05/02/2007   OBSTRUCTIVE SLEEP APNEA 05/02/2007   HYPERTENSION 05/02/2007    [name], PTA 01/05/2022, 3:10 PM  [place]. [place], [place], [address] Phone: [phone]   Fax:  [phone]  Name: Daniel [name] MRN: [id number] Date of Birth: 13-Apr-1956

## 2022-01-10 ENCOUNTER — Encounter: Payer: Self-pay | Admitting: Physical Therapy

## 2022-01-10 ENCOUNTER — Ambulatory Visit: Payer: Medicare PPO | Admitting: Physical Therapy

## 2022-01-10 DIAGNOSIS — M6281 Muscle weakness (generalized): Secondary | ICD-10-CM

## 2022-01-10 DIAGNOSIS — R262 Difficulty in walking, not elsewhere classified: Secondary | ICD-10-CM

## 2022-01-10 NOTE — Therapy (Addendum)
[place]. [place], [place], [address] Phone: [phone]   Fax:  [phone]  Physical Therapy Treatment Progress Note Reporting Period 12/08/21 to 01/10/22  See note below for Objective Data and Assessment of Progress/Goals.     Patient Details  Name: Daniel [name] MRN: [id number] Date of Birth: 05/22/1955 Referring Provider (PT): Repass   Encounter Date: 01/10/2022   PT End of Session - 01/10/22 1512     Visit Number 19    Date for PT Re-Evaluation 02/03/22    Authorization Type [employer]    PT Start Time 3382    PT Stop Time 1515    PT Time Calculation (min) 55 min    Activity Tolerance Patient tolerated treatment well    Behavior During Therapy [facility] for tasks assessed/performed             Past Medical History:  Diagnosis Date   Arthritis    Diabetes mellitus without complication ([place])    Elevated PSA 08/29/2012   7.22   Hyperlipidemia    Hypertension    Obstructive sleep apnea    CPAP   Prostate cancer [facility]) Feb 2014   Spinal stenosis     Past Surgical History:  Procedure Laterality Date   KNEE SURGERY     torn meniscus   PILONIDAL CYST EXCISION     lower spine age 3   PROSTATE BIOPSY Bilateral 08/29/2012   gleason 3+3=6  Dr.Marc Nesi   RADIOACTIVE SEED IMPLANT N/A 11/19/2012   Procedure: RADIOACTIVE SEED IMPLANT;  Surgeon: Hanley Ben, MD;  [facility];  Service: Urology;  Laterality: N/A;   SPINAL CORD DECOMPRESSION     Spinal injection  2012   spinal stenosis   TONSILLECTOMY     as child age 13 or 5    There were no vitals filed for this visit.   Subjective Assessment - 01/10/22 1427     Subjective "I feel good, I feel good"    Currently in Pain? No/denies                               [facility] Adult PT Treatment/Exercise - 01/10/22 0001       Ambulation/Gait   Gait Comments gait 4 laps no device and then without rest FWW x 5 laps, total 1026f'.       Lumbar Exercises: Stretches   Passive Hamstring Stretch Right;Left;3 reps;20 seconds    Other Lumbar Stretch Exercise lumbar stretch with ball x 4      Lumbar Exercises: Aerobic   UBE (Upper Arm Bike) level 5 x 6 minutes power burst last 10 seconds up to 300 watts 5 out of 6 times    Nustep L7 x 8 min      Lumbar Exercises: Machines for Strengthening   Cybex Knee Extension 35# 2x12    Cybex Knee Flexion 65lb 2x12    Other Lumbar Machine Exercise Rows & Lats 65lb 2x15, Chest press 35lb 2x15                       PT Short Term Goals - 05/10/21 1505       PT SHORT TERM GOAL #1   Title give HEP that he will do at home    Status Achieved               PT Long Term Goals - 12/20/21 1702  PT LONG TERM GOAL #3   Title walk with a cane or lessed AD x 500 feet    Status Partially Met                   Plan - 01/10/22 1512     Clinical Impression Statement Continues to progress with gait increasing his functional endurance. No pain during session. Increase fatigue during gait. Pt manage to complete 9 total laps around clinic with a dn without AD. Good strength on machine level interventions    Rehab Potential Good    PT Frequency 2x / week    PT Duration 12 weeks    PT Treatment/Interventions Gait training;Neuromuscular re-education;Balance training;Therapeutic exercise;Therapeutic activities;Functional mobility training;Stair training;Patient/family education;Manual techniques    PT Next Visit Plan continue to pushe hte endurance and function             Patient will benefit from skilled therapeutic intervention in order to improve the following deficits and impairments:  Abnormal gait, Decreased coordination, Decreased range of motion, Difficulty walking, Decreased endurance, Cardiopulmonary status limiting activity, Decreased activity tolerance, Decreased balance, Improper body mechanics, Postural dysfunction, Decreased strength, Decreased  mobility  Visit Diagnosis: Muscle weakness (generalized)  Difficulty in walking, not elsewhere classified     Problem List Patient Active Problem List   Diagnosis Date Noted   Prostate cancer ([place]) 09/26/2012   DYSLIPIDEMIA 05/02/2007   MORBID OBESITY 05/02/2007   OBSTRUCTIVE SLEEP APNEA 05/02/2007   HYPERTENSION 05/02/2007   Andris Baumann, DPT [name], PTA 01/10/2022, 3:14 PM  [place]. [place], [place], [address] Phone: [phone]   Fax:  [phone]  Name: Antoneo Ghrist MRN: [id number] Date of Birth: May 17, 1955

## 2022-01-12 ENCOUNTER — Encounter: Payer: Self-pay | Admitting: Physical Therapy

## 2022-01-12 ENCOUNTER — Ambulatory Visit: Payer: Medicare PPO | Admitting: Physical Therapy

## 2022-01-12 DIAGNOSIS — R262 Difficulty in walking, not elsewhere classified: Secondary | ICD-10-CM | POA: Diagnosis not present

## 2022-01-12 DIAGNOSIS — M6281 Muscle weakness (generalized): Secondary | ICD-10-CM | POA: Diagnosis not present

## 2022-01-12 NOTE — Therapy (Signed)
[place]. [place], [place], [address] Phone: [phone]   Fax:  [phone]  Physical Therapy Treatment  Patient Details  Name: Daniel [name] MRN: [id number] Date of Birth: 29-Jan-1956 Referring Provider (PT): Repass   Encounter Date: 01/12/2022   PT End of Session - 01/12/22 1523     Visit Number 25    Date for PT Re-Evaluation 02/03/22    PT Start Time 1418    PT Stop Time 1515    PT Time Calculation (min) 57 min    Activity Tolerance Patient tolerated treatment well    Behavior During Therapy [facility] for tasks assessed/performed             Past Medical History:  Diagnosis Date   Arthritis    Diabetes mellitus without complication ([place])    Elevated PSA 08/29/2012   7.22   Hyperlipidemia    Hypertension    Obstructive sleep apnea    CPAP   Prostate cancer [facility]) Feb 2014   Spinal stenosis     Past Surgical History:  Procedure Laterality Date   KNEE SURGERY     torn meniscus   PILONIDAL CYST EXCISION     lower spine age 54   PROSTATE BIOPSY Bilateral 08/29/2012   gleason 3+3=6  Dr.Marc Nesi   RADIOACTIVE SEED IMPLANT N/A 11/19/2012   Procedure: RADIOACTIVE SEED IMPLANT;  Surgeon: Hanley Ben, MD;  [facility];  Service: Urology;  Laterality: N/A;   SPINAL CORD DECOMPRESSION     Spinal injection  2012   spinal stenosis   TONSILLECTOMY     as child age 66 or 5    There were no vitals filed for this visit.   Subjective Assessment - 01/12/22 1417     Subjective "Pretty good"    Currently in Pain? No/denies                               [facility] Adult PT Treatment/Exercise - 01/12/22 0001       Ambulation/Gait   Gait Comments gait 3 laps no device and then without rest FWW x  laps, total 867f'.      Lumbar Exercises: Stretches   Passive Hamstring Stretch Right;Left;3 reps;20 seconds    Other Lumbar Stretch Exercise lumbar stretch with ball x 4      Lumbar  Exercises: Aerobic   UBE (Upper Arm Bike) level 5 x 6 minutes power burst last 10 seconds up to 300 watts 5 out of 6 times    Nustep L7 x 8 min      Lumbar Exercises: Machines for Strengthening   Leg Press 120lb 2x15, 120 heel raises 2x15    Other Lumbar Machine Exercise Rows & Lats 65lb 2x15                       PT Short Term Goals - 05/10/21 1505       PT SHORT TERM GOAL #1   Title give HEP that he will do at home    Status Achieved               PT Long Term Goals - 01/10/22 1533       PT LONG TERM GOAL #1   Title independent with an advanced HEP    Status Achieved      PT LONG TERM GOAL #2   Title decrease TUG time  to 18 seconds    Status Achieved      PT LONG TERM GOAL #3   Title walk with a cane or lessed AD x 500 feet    Status Partially Met      PT LONG TERM GOAL #4   Title get up from sitting without using hands    Status Partially Met                   Plan - 01/12/22 1524     Clinical Impression Statement Pt enters with reports of feeling well. Pt did not tolerate the same gait distance as last session. Pt has reports of hamstring tightness with cramping sensation during gait. Increase fatigue during gait trial. Pt reports a pull in the gluts with heel raises on leg press. No issues with rows and lats.    Rehab Potential Good    PT Frequency 2x / week    PT Treatment/Interventions Gait training;Neuromuscular re-education;Balance training;Therapeutic exercise;Therapeutic activities;Functional mobility training;Stair training;Patient/family education;Manual techniques    PT Next Visit Plan continue to push the endurance and function             Patient will benefit from skilled therapeutic intervention in order to improve the following deficits and impairments:  Abnormal gait, Decreased coordination, Decreased range of motion, Difficulty walking, Decreased endurance, Cardiopulmonary status limiting activity, Decreased activity  tolerance, Decreased balance, Improper body mechanics, Postural dysfunction, Decreased strength, Decreased mobility  Visit Diagnosis: Difficulty in walking, not elsewhere classified  Muscle weakness (generalized)     Problem List Patient Active Problem List   Diagnosis Date Noted   Prostate cancer ([place]) 09/26/2012   DYSLIPIDEMIA 05/02/2007   MORBID OBESITY 05/02/2007   OBSTRUCTIVE SLEEP APNEA 05/02/2007   HYPERTENSION 05/02/2007    [name], PTA 01/12/2022, 3:26 PM  [place]. [place], [place], [address] Phone: [phone]   Fax:  [phone]  Name: Daniel [name] MRN: [id number] Date of Birth: [birth year]

## 2022-01-17 ENCOUNTER — Encounter: Payer: Self-pay | Admitting: Physical Therapy

## 2022-01-17 ENCOUNTER — Ambulatory Visit: Payer: Medicare PPO | Admitting: Physical Therapy

## 2022-01-17 DIAGNOSIS — M6281 Muscle weakness (generalized): Secondary | ICD-10-CM | POA: Diagnosis not present

## 2022-01-17 DIAGNOSIS — R262 Difficulty in walking, not elsewhere classified: Secondary | ICD-10-CM | POA: Diagnosis not present

## 2022-01-17 NOTE — Therapy (Signed)
[place]. [place], [place], [address] Phone: [phone]   Fax:  [phone]  Physical Therapy Treatment  Patient Details  Name: Daniel [name] MRN: [id number] Date of Birth: 01-Mar-1956 Referring Provider (PT): Repass   Encounter Date: 01/17/2022   PT End of Session - 01/17/22 1430     Visit Number 23    Date for PT Re-Evaluation 02/03/22    Authorization Type [employer]    PT Start Time 1419    PT Stop Time 1515    PT Time Calculation (min) 56 min    Activity Tolerance Patient tolerated treatment well    Behavior During Therapy [facility] for tasks assessed/performed             Past Medical History:  Diagnosis Date   Arthritis    Diabetes mellitus without complication ([place])    Elevated PSA 08/29/2012   7.22   Hyperlipidemia    Hypertension    Obstructive sleep apnea    CPAP   Prostate cancer [facility]) Feb 2014   Spinal stenosis     Past Surgical History:  Procedure Laterality Date   KNEE SURGERY     torn meniscus   PILONIDAL CYST EXCISION     lower spine age 69   PROSTATE BIOPSY Bilateral 08/29/2012   gleason 3+3=6  Dr.Marc Nesi   RADIOACTIVE SEED IMPLANT N/A 11/19/2012   Procedure: RADIOACTIVE SEED IMPLANT;  Surgeon: Hanley Ben, MD;  [facility];  Service: Urology;  Laterality: N/A;   SPINAL CORD DECOMPRESSION     Spinal injection  2012   spinal stenosis   TONSILLECTOMY     as child age 49 or 5    There were no vitals filed for this visit.   Subjective Assessment - 01/17/22 1430     Subjective "Pretty good"    Currently in Pain? No/denies                               [facility] Adult PT Treatment/Exercise - 01/17/22 0001       Ambulation/Gait   Gait Comments gait 4 laps no device and then without rest FWW x 5 laps, total 1021f'.      Lumbar Exercises: Aerobic   UBE (Upper Arm Bike) level 5 x 6 minutes power burst last 10 seconds up to 300 watts 5 out of 6 times     Nustep L7 x 8 min      Lumbar Exercises: Machines for Strengthening   Leg Press 140lb 3 x12      Lumbar Exercises: Standing   Other Standing Lumbar Exercises Standing OHP blue ball 2x10                       PT Short Term Goals - 05/10/21 1505       PT SHORT TERM GOAL #1   Title give HEP that he will do at home    Status Achieved               PT Long Term Goals - 01/10/22 1533       PT LONG TERM GOAL #1   Title independent with an advanced HEP    Status Achieved      PT LONG TERM GOAL #2   Title decrease TUG time to 18 seconds    Status Achieved      PT LONG TERM GOAL #3  Title walk with a cane or lessed AD x 500 feet    Status Partially Met      PT LONG TERM GOAL #4   Title get up from sitting without using hands    Status Partially Met                   Plan - 01/17/22 1512     Clinical Impression Statement Again pt enters doing well. Continues to focus on functional mobility and endurance. Increase resistance tolerated on leg press. Pt did have some difficulty maintaining balance with standing OHP.    Rehab Potential Good    PT Frequency 2x / week    PT Duration 12 weeks    PT Treatment/Interventions Gait training;Neuromuscular re-education;Balance training;Therapeutic exercise;Therapeutic activities;Functional mobility training;Stair training;Patient/family education;Manual techniques    PT Next Visit Plan continue to pushe hte endurance and function             Patient will benefit from skilled therapeutic intervention in order to improve the following deficits and impairments:  Abnormal gait, Decreased coordination, Decreased range of motion, Difficulty walking, Decreased endurance, Cardiopulmonary status limiting activity, Decreased activity tolerance, Decreased balance, Improper body mechanics, Postural dysfunction, Decreased strength, Decreased mobility  Visit Diagnosis: Difficulty in walking, not elsewhere  classified  Muscle weakness (generalized)     Problem List Patient Active Problem List   Diagnosis Date Noted   Prostate cancer ([place]) 09/26/2012   DYSLIPIDEMIA 05/02/2007   MORBID OBESITY 05/02/2007   OBSTRUCTIVE SLEEP APNEA 05/02/2007   HYPERTENSION 05/02/2007    [name], PTA 01/17/2022, 3:14 PM  [place]. [place], [place], [address] Phone: [phone]   Fax:  [phone]  Name: Daniel [name] MRN: [id number] Date of Birth: 10-07-1955

## 2022-01-19 ENCOUNTER — Ambulatory Visit: Payer: Medicare PPO | Admitting: Physical Therapy

## 2022-01-19 ENCOUNTER — Encounter: Payer: Self-pay | Admitting: Physical Therapy

## 2022-01-19 DIAGNOSIS — R262 Difficulty in walking, not elsewhere classified: Secondary | ICD-10-CM | POA: Diagnosis not present

## 2022-01-19 DIAGNOSIS — M6281 Muscle weakness (generalized): Secondary | ICD-10-CM | POA: Diagnosis not present

## 2022-01-19 NOTE — Therapy (Signed)
[place]. [place], [place], [address] Phone: [phone]   Fax:  [phone]  Physical Therapy Treatment  Patient Details  Name: Daniel [name] MRN: [id number] Date of Birth: 04/07/1956 Referring Provider (PT): Repass   Encounter Date: 01/19/2022   PT End of Session - 01/19/22 1431     Visit Number 54    Date for PT Re-Evaluation 02/03/22    Authorization Type [employer]    PT Start Time 1425    PT Stop Time 1515    PT Time Calculation (min) 50 min    Activity Tolerance Patient tolerated treatment well    Behavior During Therapy [facility] for tasks assessed/performed             Past Medical History:  Diagnosis Date   Arthritis    Diabetes mellitus without complication ([place])    Elevated PSA 08/29/2012   7.22   Hyperlipidemia    Hypertension    Obstructive sleep apnea    CPAP   Prostate cancer [facility]) Feb 2014   Spinal stenosis     Past Surgical History:  Procedure Laterality Date   KNEE SURGERY     torn meniscus   PILONIDAL CYST EXCISION     lower spine age 8   PROSTATE BIOPSY Bilateral 08/29/2012   gleason 3+3=6  Dr.Marc Nesi   RADIOACTIVE SEED IMPLANT N/A 11/19/2012   Procedure: RADIOACTIVE SEED IMPLANT;  Surgeon: Hanley Ben, MD;  [facility];  Service: Urology;  Laterality: N/A;   SPINAL CORD DECOMPRESSION     Spinal injection  2012   spinal stenosis   TONSILLECTOMY     as child age 55 or 5    There were no vitals filed for this visit.   Subjective Assessment - 01/19/22 1432     Subjective "I feel good"    Currently in Pain? No/denies                               [facility] Adult PT Treatment/Exercise - 01/19/22 0001       Ambulation/Gait   Gait Comments gait 5 laps no device and then without rest FWW x 5 laps, total 1254ft'.   Pt verry fatigued     Lumbar Exercises: Aerobic   UBE (Upper Arm Bike) level 5 x 6 minutes power burst last 10 seconds up to 300 watts 5  out of 6 times    Nustep L7 x 8 min      Lumbar Exercises: Machines for Strengthening   Other Lumbar Machine Exercise Rows 3x10 & Lats 2x15 75lb                       PT Short Term Goals - 05/10/21 1505       PT SHORT TERM GOAL #1   Title give HEP that he will do at home    Status Achieved               PT Long Term Goals - 01/10/22 1533       PT LONG TERM GOAL #1   Title independent with an advanced HEP    Status Achieved      PT LONG TERM GOAL #2   Title decrease TUG time to 18 seconds    Status Achieved      PT LONG TERM GOAL #3   Title walk with a cane or lessed AD  x 500 feet    Status Partially Met      PT LONG TERM GOAL #4   Title get up from sitting without using hands    Status Partially Met                   Plan - 01/19/22 1515     Clinical Impression Statement Pt did well increasing his overall gait tolerance without AD. He continues to fatigue really quick with functional activities. No reports of pain, increase resistance tolerated with seated rows and lats.    Rehab Potential Good    PT Frequency 2x / week    PT Duration 12 weeks    PT Treatment/Interventions Gait training;Neuromuscular re-education;Balance training;Therapeutic exercise;Therapeutic activities;Functional mobility training;Stair training;Patient/family education;Manual techniques    PT Next Visit Plan continue to pushe hte endurance and function             Patient will benefit from skilled therapeutic intervention in order to improve the following deficits and impairments:     Visit Diagnosis: Difficulty in walking, not elsewhere classified  Muscle weakness (generalized)     Problem List Patient Active Problem List   Diagnosis Date Noted   Prostate cancer ([place]) 09/26/2012   DYSLIPIDEMIA 05/02/2007   MORBID OBESITY 05/02/2007   OBSTRUCTIVE SLEEP APNEA 05/02/2007   HYPERTENSION 05/02/2007    [name], PTA 01/19/2022, 3:20  PM  [place]. [place], [place], [address] Phone: [phone]   Fax:  [phone]  Name: Daniel [name] MRN: [id number] Date of Birth: 07/03/1955

## 2022-01-24 ENCOUNTER — Encounter: Payer: Self-pay | Admitting: Physical Therapy

## 2022-01-24 ENCOUNTER — Ambulatory Visit: Payer: Medicare PPO | Attending: Internal Medicine | Admitting: Physical Therapy

## 2022-01-24 DIAGNOSIS — R262 Difficulty in walking, not elsewhere classified: Secondary | ICD-10-CM | POA: Diagnosis not present

## 2022-01-24 DIAGNOSIS — M6281 Muscle weakness (generalized): Secondary | ICD-10-CM | POA: Insufficient documentation

## 2022-01-24 NOTE — Therapy (Signed)
[place]. [place], [place], [address] Phone: [phone]   Fax:  [phone]  Physical Therapy Treatment  Patient Details  Name: Daniel [name] MRN: [id number] Date of Birth: January 08, 1956 Referring Provider (PT): Repass   Encounter Date: 01/24/2022   PT End of Session - 01/24/22 1439     Visit Number 29    Date for PT Re-Evaluation 02/03/22    Authorization Type [employer]    Authorization Time Period 17/20    PT Start Time 1433    PT Stop Time 1530    PT Time Calculation (min) 57 min    Equipment Utilized During Treatment Gait belt    Activity Tolerance Patient tolerated treatment well    Behavior During Therapy [name] for tasks assessed/performed             Past Medical History:  Diagnosis Date   Arthritis    Diabetes mellitus without complication ([place])    Elevated PSA 08/29/2012   7.22   Hyperlipidemia    Hypertension    Obstructive sleep apnea    CPAP   Prostate cancer [facility]) Feb 2014   Spinal stenosis     Past Surgical History:  Procedure Laterality Date   KNEE SURGERY     torn meniscus   PILONIDAL CYST EXCISION     lower spine age 56   PROSTATE BIOPSY Bilateral 08/29/2012   gleason 3+3=6  Dr.Marc Nesi   RADIOACTIVE SEED IMPLANT N/A 11/19/2012   Procedure: RADIOACTIVE SEED IMPLANT;  Surgeon: Hanley Ben, MD;  Location: [facility];  Service: Urology;  Laterality: N/A;   SPINAL CORD DECOMPRESSION     Spinal injection  2012   spinal stenosis   TONSILLECTOMY     as child age 14 or 5    There were no vitals filed for this visit.   Subjective Assessment - 01/24/22 1445     Subjective I am going to go look at a gym on Thursday, biggest issue is getting in the door and do they have the machines    Currently in Pain? No/denies                               [facility] Adult PT Treatment/Exercise - 01/24/22 0001       Ambulation/Gait   Gait Comments gait 5 laps no device and then  without rest FWW x 5 laps, total 1268f'.      Lumbar Exercises: Stretches   Passive Hamstring Stretch Right;Left;3 reps;20 seconds    Other Lumbar Stretch Exercise lumbar stretch with ball x 4      Lumbar Exercises: Aerobic   UBE (Upper Arm Bike) level 5 x 6 minutes power burst last 10 seconds up to 300 - 400watts 5 out of 6 times    Nustep Level 7 x 8 minutes      Lumbar Exercises: Machines for Strengthening   Cybex Knee Extension 35# 2x12    Cybex Knee Flexion 65lb 2x12    Other Lumbar Machine Exercise 55# chest press 3x10    Other Lumbar Machine Exercise Rows 3x10 & Lats 2x15 75lb                       PT Short Term Goals - 05/10/21 1505       PT SHORT TERM GOAL #1   Title give HEP that he will do at home  Status Achieved               PT Long Term Goals - 01/10/22 1533       PT LONG TERM GOAL #1   Title independent with an advanced HEP    Status Achieved      PT LONG TERM GOAL #2   Title decrease TUG time to 18 seconds    Status Achieved      PT LONG TERM GOAL #3   Title walk with a cane or lessed AD x 500 feet    Status Partially Met      PT LONG TERM GOAL #4   Title get up from sitting without using hands    Status Partially Met                   Plan - 01/24/22 1526     Clinical Impression Statement Patient continues to improve able to go 600 feet without device and then 600 feet with FWW, without rest.  He is thinking about going to a gym, I filled out a flow sheet for him to talk to the gym and see if they have the machines and can he do then    PT Next Visit Plan he is to look at the gym and see if he can do it this week, then we will problem solve with him    Consulted and Agree with Plan of Care Patient             Patient will benefit from skilled therapeutic intervention in order to improve the following deficits and impairments:  Abnormal gait, Decreased coordination, Decreased range of motion, Difficulty  walking, Decreased endurance, Cardiopulmonary status limiting activity, Decreased activity tolerance, Decreased balance, Improper body mechanics, Postural dysfunction, Decreased strength, Decreased mobility  Visit Diagnosis: Difficulty in walking, not elsewhere classified  Muscle weakness (generalized)     Problem List Patient Active Problem List   Diagnosis Date Noted   Prostate cancer ([place]) 09/26/2012   DYSLIPIDEMIA 05/02/2007   MORBID OBESITY 05/02/2007   OBSTRUCTIVE SLEEP APNEA 05/02/2007   HYPERTENSION 05/02/2007    Sumner Boast, PT 01/24/2022, 3:29 PM  [place]. [place], [place], [address] Phone: [phone]   Fax:  [phone]  Name: Daniel [name] MRN: [id number] Date of Birth: 11/23/1955

## 2022-01-26 ENCOUNTER — Ambulatory Visit: Payer: Medicare PPO | Admitting: Physical Therapy

## 2022-01-26 DIAGNOSIS — M6281 Muscle weakness (generalized): Secondary | ICD-10-CM | POA: Diagnosis not present

## 2022-01-26 DIAGNOSIS — R262 Difficulty in walking, not elsewhere classified: Secondary | ICD-10-CM | POA: Diagnosis not present

## 2022-01-26 NOTE — Therapy (Deleted)
[place]. [place], [place], [address] Phone: [phone]   Fax:  [phone]  Physical Therapy Treatment  Patient Details  Name: Daniel [name] MRN: [id number] Date of Birth: 1955-06-16 Referring Provider (PT): Repass   Encounter Date: 01/26/2022   PT End of Session - 01/26/22 1145     Visit Number 95    Number of Visits 13    Date for PT Re-Evaluation 02/03/22    Authorization Type [employer]    PT Start Time 6834    PT Stop Time 1230    PT Time Calculation (min) 45 min             Past Medical History:  Diagnosis Date   Arthritis    Diabetes mellitus without complication ([place])    Elevated PSA 08/29/2012   7.22   Hyperlipidemia    Hypertension    Obstructive sleep apnea    CPAP   Prostate cancer [facility]) Feb 2014   Spinal stenosis     Past Surgical History:  Procedure Laterality Date   KNEE SURGERY     torn meniscus   PILONIDAL CYST EXCISION     lower spine age 82   PROSTATE BIOPSY Bilateral 08/29/2012   gleason 3+3=6  Dr.Marc Nesi   RADIOACTIVE SEED IMPLANT N/A 11/19/2012   Procedure: RADIOACTIVE SEED IMPLANT;  Surgeon: Hanley Ben, MD;  [facility];  Service: Urology;  Laterality: N/A;   SPINAL CORD DECOMPRESSION     Spinal injection  2012   spinal stenosis   TONSILLECTOMY     as child age 27 or 5    There were no vitals filed for this visit.                                   PT Short Term Goals - 05/10/21 1505       PT SHORT TERM GOAL #1   Title give HEP that he will do at home    Status Achieved               PT Long Term Goals - 01/10/22 1533       PT LONG TERM GOAL #1   Title independent with an advanced HEP    Status Achieved      PT LONG TERM GOAL #2   Title decrease TUG time to 18 seconds    Status Achieved      PT LONG TERM GOAL #3   Title walk with a cane or lessed AD x 500 feet    Status Partially Met      PT LONG TERM  GOAL #4   Title get up from sitting without using hands    Status Partially Met                     Patient will benefit from skilled therapeutic intervention in order to improve the following deficits and impairments:     Visit Diagnosis: Difficulty in walking, not elsewhere classified  Muscle weakness (generalized)     Problem List Patient Active Problem List   Diagnosis Date Noted   Prostate cancer ([place]) 09/26/2012   DYSLIPIDEMIA 05/02/2007   MORBID OBESITY 05/02/2007   OBSTRUCTIVE SLEEP APNEA 05/02/2007   HYPERTENSION 05/02/2007    Dura Mccormack,[name], PTA 01/26/2022, 11:46 AM  [place]. [place], [place], [address] Phone:  [phone]   Fax:  [phone]  Name: Daniel [name] MRN: [id number] Date of Birth: 09/23/1955  [place]. [place], [place], [address] Phone: [phone]   Fax:  [phone]  Patient Details  Name: Daniel [name] MRN: [id number] Date of Birth: 1955-06-04 Referring Provider:  Benito Mccreedy, MD  Encounter Date: 01/26/2022   Laqueta Carina, PTA 01/26/2022, 11:46 AM  [place]. [place], [place], [address] Phone: [phone]   Fax:  [phone]

## 2022-01-26 NOTE — Therapy (Signed)
[place]. [place], [place], [address] Phone: [phone]   Fax:  [phone]  Physical Therapy Treatment  Patient Details  Name: Davyd Podgorski MRN: [id number] Date of Birth: [birth year] Referring Provider (PT): Repass   Encounter Date: 01/26/2022   PT End of Session - 01/26/22 1145     Visit Number 95    Number of Visits 13    Date for PT Re-Evaluation 02/03/22    Authorization Type [employer]    PT Start Time 9798    PT Stop Time 1230    PT Time Calculation (min) 45 min             Past Medical History:  Diagnosis Date   Arthritis    Diabetes mellitus without complication ([place])    Elevated PSA 08/29/2012   7.22   Hyperlipidemia    Hypertension    Obstructive sleep apnea    CPAP   Prostate cancer [facility]) Feb 2014   Spinal stenosis     Past Surgical History:  Procedure Laterality Date   KNEE SURGERY     torn meniscus   PILONIDAL CYST EXCISION     lower spine age 42   PROSTATE BIOPSY Bilateral 08/29/2012   gleason 3+3=6  Dr.Marc Nesi   RADIOACTIVE SEED IMPLANT N/A 11/19/2012   Procedure: RADIOACTIVE SEED IMPLANT;  Surgeon: Hanley Ben, MD;  [facility];  Service: Urology;  Laterality: N/A;   SPINAL CORD DECOMPRESSION     Spinal injection  2012   spinal stenosis   TONSILLECTOMY     as child age 61 or 5    There were no vitals filed for this visit.                                   PT Short Term Goals - 05/10/21 1505       PT SHORT TERM GOAL #1   Title give HEP that he will do at home    Status Achieved               PT Long Term Goals - 01/10/22 1533       PT LONG TERM GOAL #1   Title independent with an advanced HEP    Status Achieved      PT LONG TERM GOAL #2   Title decrease TUG time to 18 seconds    Status Achieved      PT LONG TERM GOAL #3   Title walk with a cane or lessed AD x 500 feet    Status Partially Met      PT LONG TERM  GOAL #4   Title get up from sitting without using hands    Status Partially Met                     Patient will benefit from skilled therapeutic intervention in order to improve the following deficits and impairments:     Visit Diagnosis: Difficulty in walking, not elsewhere classified  Muscle weakness (generalized)     Problem List Patient Active Problem List   Diagnosis Date Noted   Prostate cancer ([place]) 09/26/2012   DYSLIPIDEMIA 05/02/2007   MORBID OBESITY 05/02/2007   OBSTRUCTIVE SLEEP APNEA 05/02/2007   HYPERTENSION 05/02/2007    PAYSEUR,[name], PTA 01/26/2022, 11:47 AM  [place]. [place], [place], [address] Phone:  [phone]   Fax:  [phone]  Name: Camil Wilhelmsen MRN: [id number] Date of Birth: 1956-04-15  [place]. [place], [place], [address] Phone: [phone]   Fax:  [phone]  Patient Details  Name: Myka Lukins MRN: [id number] Date of Birth: [birth year] Referring Provider:  Benito Mccreedy, MD  Encounter Date: 01/26/2022   Laqueta Carina, PTA 01/26/2022, 11:47 AM  [place]. [place], [place], [address] Phone: [phone]   Fax:  [phone]

## 2022-01-31 ENCOUNTER — Ambulatory Visit: Payer: Medicare PPO | Admitting: Physical Therapy

## 2022-01-31 ENCOUNTER — Encounter: Payer: Self-pay | Admitting: Physical Therapy

## 2022-01-31 DIAGNOSIS — M6281 Muscle weakness (generalized): Secondary | ICD-10-CM

## 2022-01-31 DIAGNOSIS — R262 Difficulty in walking, not elsewhere classified: Secondary | ICD-10-CM | POA: Diagnosis not present

## 2022-01-31 NOTE — Therapy (Signed)
[place]. [place], [place], [address] Phone: [phone]   Fax:  [phone]  Physical Therapy Treatment  Patient Details  Name: Daniel [name] MRN: [id number] Date of Birth: 1956-03-03 Referring Provider (PT): Repass   Encounter Date: 01/31/2022   PT End of Session - 01/31/22 1507     Visit Number 96    Date for PT Re-Evaluation 02/03/22    PT Start Time 1416    PT Stop Time 1510    PT Time Calculation (min) 54 min    Activity Tolerance Patient tolerated treatment well;Patient limited by fatigue    Behavior During Therapy [facility] for tasks assessed/performed             Past Medical History:  Diagnosis Date   Arthritis    Diabetes mellitus without complication ([place])    Elevated PSA 08/29/2012   7.22   Hyperlipidemia    Hypertension    Obstructive sleep apnea    CPAP   Prostate cancer [facility]) Feb 2014   Spinal stenosis     Past Surgical History:  Procedure Laterality Date   KNEE SURGERY     torn meniscus   PILONIDAL CYST EXCISION     lower spine age 56   PROSTATE BIOPSY Bilateral 08/29/2012   gleason 3+3=6  Dr.Marc Nesi   RADIOACTIVE SEED IMPLANT N/A 11/19/2012   Procedure: RADIOACTIVE SEED IMPLANT;  Surgeon: Hanley Ben, MD;  [facility];  Service: Urology;  Laterality: N/A;   SPINAL CORD DECOMPRESSION     Spinal injection  2012   spinal stenosis   TONSILLECTOMY     as child age 9 or 5    There were no vitals filed for this visit.   Subjective Assessment - 01/31/22 1430     Subjective Just tired, low energy    Currently in Pain? No/denies                               [facility] Adult PT Treatment/Exercise - 01/31/22 0001       Ambulation/Gait   Gait Comments gait 3 laps no device and then without rest FWW x 3 laps, total 733f'.      Lumbar Exercises: Stretches   Other Lumbar Stretch Exercise lumbar stretch with ball x 4      Lumbar Exercises: Aerobic    UBE (Upper Arm Bike) level 5 x 6 minutes power burst last 10 seconds up to 300 - 400watts 5 out of 6 times    Nustep Level 7 x 8 minutes      Knee/Hip Exercises: Standing   Forward Step Up Both;2 sets;5 reps;Hand Hold: 2;Step Height: 6"      Knee/Hip Exercises: Seated   Sit to Sand 2 sets;10 reps;without UE support   OHP blue ball                      PT Short Term Goals - 05/10/21 1505       PT SHORT TERM GOAL #1   Title give HEP that he will do at home    Status Achieved               PT Long Term Goals - 01/10/22 1533       PT LONG TERM GOAL #1   Title independent with an advanced HEP    Status Achieved      PT LONG TERM  GOAL #2   Title decrease TUG time to 18 seconds    Status Achieved      PT LONG TERM GOAL #3   Title walk with a cane or lessed AD x 500 feet    Status Partially Met      PT LONG TERM GOAL #4   Title get up from sitting without using hands    Status Partially Met                   Plan - 01/31/22 1507     Clinical Impression Statement increase fatigue reported upon entering clinic. Increase LE cramping reported after three laps around clinic. Cue not to use UE as much with steps ups. Cue for eccentric control with step ups.    Rehab Potential Good    PT Frequency 2x / week    PT Duration 12 weeks    PT Treatment/Interventions Gait training;Neuromuscular re-education;Balance training;Therapeutic exercise;Therapeutic activities;Functional mobility training;Stair training;Patient/family education;Manual techniques    PT Next Visit Plan he is to look at the gym and see if he can do it this week, then we will problem solve with him             Patient will benefit from skilled therapeutic intervention in order to improve the following deficits and impairments:  Abnormal gait, Decreased coordination, Decreased range of motion, Difficulty walking, Decreased endurance, Cardiopulmonary status limiting activity, Decreased  activity tolerance, Decreased balance, Improper body mechanics, Postural dysfunction, Decreased strength, Decreased mobility  Visit Diagnosis: Difficulty in walking, not elsewhere classified  Muscle weakness (generalized)     Problem List Patient Active Problem List   Diagnosis Date Noted   Prostate cancer ([place]) 09/26/2012   DYSLIPIDEMIA 05/02/2007   MORBID OBESITY 05/02/2007   OBSTRUCTIVE SLEEP APNEA 05/02/2007   HYPERTENSION 05/02/2007    [name], PTA 01/31/2022, 3:09 PM  [place]. [place], [place], [address] Phone: [phone]   Fax:  [phone]  Name: Daniel [name] MRN: [id number] Date of Birth: [birth year]

## 2022-02-02 ENCOUNTER — Encounter: Payer: Self-pay | Admitting: Physical Therapy

## 2022-02-02 ENCOUNTER — Ambulatory Visit: Payer: Medicare PPO | Admitting: Physical Therapy

## 2022-02-02 DIAGNOSIS — R262 Difficulty in walking, not elsewhere classified: Secondary | ICD-10-CM | POA: Diagnosis not present

## 2022-02-02 DIAGNOSIS — M6281 Muscle weakness (generalized): Secondary | ICD-10-CM

## 2022-02-02 NOTE — Therapy (Signed)
[place]. [place], [place], [address] Phone: [phone]   Fax:  [phone]  Physical Therapy Treatment  Patient Details  Name: Daniel [name] MRN: [id number] Date of Birth: [birth year] Referring Provider (PT): Repass   Encounter Date: 02/02/2022   PT End of Session - 02/02/22 1512     Visit Number 4    Date for PT Re-Evaluation 02/03/22    PT Start Time 6761    PT Stop Time 1513    PT Time Calculation (min) 53 min    Activity Tolerance Patient tolerated treatment well;Patient limited by fatigue    Behavior During Therapy [facility] for tasks assessed/performed             Past Medical History:  Diagnosis Date   Arthritis    Diabetes mellitus without complication ([place])    Elevated PSA 08/29/2012   7.22   Hyperlipidemia    Hypertension    Obstructive sleep apnea    CPAP   Prostate cancer [facility]) Feb 2014   Spinal stenosis     Past Surgical History:  Procedure Laterality Date   KNEE SURGERY     torn meniscus   PILONIDAL CYST EXCISION     lower spine age 66   PROSTATE BIOPSY Bilateral 08/29/2012   gleason 3+3=6  Dr.Marc Nesi   RADIOACTIVE SEED IMPLANT N/A 11/19/2012   Procedure: RADIOACTIVE SEED IMPLANT;  Surgeon: Hanley Ben, MD;  [facility];  Service: Urology;  Laterality: N/A;   SPINAL CORD DECOMPRESSION     Spinal injection  2012   spinal stenosis   TONSILLECTOMY     as child age 28 or 5    There were no vitals filed for this visit.   Subjective Assessment - 02/02/22 1428     Subjective "I feel pretty good, little tightness in L hammy"    Currently in Pain? No/denies                [facility] PT Assessment - 02/02/22 0001       Standardized Balance Assessment   Standardized Balance Assessment Berg Balance Test      Berg Balance Test   Sit to Stand Able to stand  independently using hands    Standing Unsupported Able to stand 2 minutes with supervision    Sitting with Back  Unsupported but Feet Supported on Floor or Stool Able to sit safely and securely 2 minutes    Stand to Sit Sits safely with minimal use of hands    Transfers Able to transfer safely, minor use of hands    Standing Unsupported with Eyes Closed Able to stand 10 seconds with supervision    Standing Unsupported with Feet Together Needs help to attain position but able to stand for 30 seconds with feet together    From Standing, Reach Forward with Outstretched Arm Can reach forward >12 cm safely (5")    From Standing Position, Pick up Object from Floor Able to pick up shoe, needs supervision    From Standing Position, Turn to Look Behind Over each Shoulder Turn sideways only but maintains balance    Turn 360 Degrees Able to turn 360 degrees safely but slowly    Standing Unsupported, Alternately Place Feet on Step/Stool Able to complete >2 steps/needs minimal assist    Standing Unsupported, One Foot in Front Able to take small step independently and hold 30 seconds    Standing on One Leg Tries to lift leg/unable to hold  3 seconds but remains standing independently    Total Score 36      Timed Up and Go Test   Normal TUG (seconds) 17.42   RW   TUG Comments 19 seconds Without AD                           [facility] Adult PT Treatment/Exercise - 02/02/22 0001       Lumbar Exercises: Aerobic   UBE (Upper Arm Bike) level 5 x 6 minutes power burst last 10 seconds up to 300 - 400watts 5 out of 6 times    Nustep Level 7 x 8 minutes      Lumbar Exercises: Machines for Strengthening   Other Lumbar Machine Exercise Rows 3x10 & Lats 3x10 75lb                       PT Short Term Goals - 05/10/21 1505       PT SHORT TERM GOAL #1   Title give HEP that he will do at home    Status Achieved               PT Long Term Goals - 01/10/22 1533       PT LONG TERM GOAL #1   Title independent with an advanced HEP    Status Achieved      PT LONG TERM GOAL #2   Title  decrease TUG time to 18 seconds    Status Achieved      PT LONG TERM GOAL #3   Title walk with a cane or lessed AD x 500 feet    Status Partially Met      PT LONG TERM GOAL #4   Title get up from sitting without using hands    Status Partially Met                   Plan - 02/02/22 1513     Clinical Impression Statement Pt is morbidly obese limiting him in his independent activity. He reports improved mobility at home and is planing on joining the gym next week. Pt has progressed decreasing his TUG score. Pt was able to establish a TUG base line sore without AD today that's was quicker than his original TUG score with AD. BERG balance score was also establish scoring 36/56 indicating pt is at a high fall risk. Pt continues to make good strides in therapy. Pt would benefit from skilled PT services to address functional defects.    Rehab Potential Good    PT Frequency 2x / week    PT Treatment/Interventions Gait training;Neuromuscular re-education;Balance training;Therapeutic exercise;Therapeutic activities;Functional mobility training;Stair training;Patient/family education;Manual techniques    PT Next Visit Plan he is to look at the gym and see if he can do it this week, then we will problem solve with him             Patient will benefit from skilled therapeutic intervention in order to improve the following deficits and impairments:  Abnormal gait, Decreased coordination, Decreased range of motion, Difficulty walking, Decreased endurance, Cardiopulmonary status limiting activity, Decreased activity tolerance, Decreased balance, Improper body mechanics, Postural dysfunction, Decreased strength, Decreased mobility  Visit Diagnosis: No diagnosis found.     Problem List Patient Active Problem List   Diagnosis Date Noted   Prostate cancer ([place]) 09/26/2012   DYSLIPIDEMIA 05/02/2007   MORBID OBESITY 05/02/2007   OBSTRUCTIVE SLEEP APNEA 05/02/2007  HYPERTENSION 05/02/2007     [name], PTA 02/02/2022, 3:32 PM  [place]. [place], [place], [address] Phone: [phone]   Fax:  [phone]  Name: Daniel [name] MRN: [id number] Date of Birth: 04-Oct-1955

## 2022-02-07 ENCOUNTER — Encounter: Payer: Self-pay | Admitting: Physical Therapy

## 2022-02-07 ENCOUNTER — Ambulatory Visit: Payer: Medicare PPO | Admitting: Physical Therapy

## 2022-02-07 DIAGNOSIS — R262 Difficulty in walking, not elsewhere classified: Secondary | ICD-10-CM

## 2022-02-07 DIAGNOSIS — M6281 Muscle weakness (generalized): Secondary | ICD-10-CM

## 2022-02-07 NOTE — Therapy (Signed)
[place]. [place], [place], [address] Phone: [phone]   Fax:  [phone]  Physical Therapy Treatment  Patient Details  Name: Daniel [name] MRN: [id number] Date of Birth: 02/07/1956 Referring Provider (PT): Repass   Encounter Date: 02/07/2022   PT End of Session - 02/07/22 1444     Visit Number 27    Date for PT Re-Evaluation 04/22/22    Authorization Type [employer]    Authorization Time Period 1/10    PT Start Time 1439    PT Stop Time 1528    PT Time Calculation (min) 49 min    Equipment Utilized During Treatment Gait belt    Activity Tolerance Patient tolerated treatment well;Patient limited by fatigue    Behavior During Therapy [facility] for tasks assessed/performed             Past Medical History:  Diagnosis Date   Arthritis    Diabetes mellitus without complication ([place])    Elevated PSA 08/29/2012   7.22   Hyperlipidemia    Hypertension    Obstructive sleep apnea    CPAP   Prostate cancer [facility]) Feb 2014   Spinal stenosis     Past Surgical History:  Procedure Laterality Date   KNEE SURGERY     torn meniscus   PILONIDAL CYST EXCISION     lower spine age 21   PROSTATE BIOPSY Bilateral 08/29/2012   gleason 3+3=6  Dr.Marc Nesi   RADIOACTIVE SEED IMPLANT N/A 11/19/2012   Procedure: RADIOACTIVE SEED IMPLANT;  Surgeon: Hanley Ben, MD;  Location: [facility];  Service: Urology;  Laterality: N/A;   SPINAL CORD DECOMPRESSION     Spinal injection  2012   spinal stenosis   TONSILLECTOMY     as child age 74 or 5    There were no vitals filed for this visit.   Subjective Assessment - 02/07/22 1447     Subjective I had an incident today, I was bending over trying to touch toes and went a little too far forward and lost balance and fell forward, I almost cancelled due to feeling tired due to the exertion of getting up.  I am tired    Currently in Pain? No/denies                                [facility] Adult PT Treatment/Exercise - 02/07/22 0001       Lumbar Exercises: Aerobic   UBE (Upper Arm Bike) level 5 x 6 minutes power burst last 10 seconds up to 300 - 400watts 5 out of 6 times      Lumbar Exercises: Machines for Strengthening   Other Lumbar Machine Exercise Triceps 45#, biceps 20# 15# stragiht arm pulls this for core and balanc (P)     Other Lumbar Machine Exercise Rows 3x10 & Lats 3x10 75lb      Lumbar Exercises: Seated   Other Seated Lumbar Exercises lumbar stretcch with the ball rolling out (P)       Knee/Hip Exercises: Machines for Strengthening   Cybex Knee Extension 25# 2x10 (P)     Cybex Knee Flexion 65# 2 sets 15 (P)                        PT Short Term Goals - 05/10/21 1505       PT SHORT TERM GOAL #1   Title give HEP  that he will do at home    Status Achieved               PT Long Term Goals - 01/10/22 1533       PT LONG TERM GOAL #1   Title independent with an advanced HEP    Status Achieved      PT LONG TERM GOAL #2   Title decrease TUG time to 18 seconds    Status Achieved      PT LONG TERM GOAL #3   Title walk with a cane or lessed AD x 500 feet    Status Partially Met      PT LONG TERM GOAL #4   Title get up from sitting without using hands    Status Partially Met                   Plan - 02/07/22 1650     Clinical Impression Statement Patient had a fall today, he was able to get up with the use of a chair, did not have to call EMS or have other help, this is big as it shows strength that he did not have about 8 weeks ago when EMS was called at the home.  He was tired today and a little more timid.    PT Next Visit Plan he is to look at the gym and see if he can do it this week, then we will problem solve with him    Consulted and Agree with Plan of Care Patient             Patient will benefit from skilled therapeutic intervention in order to improve the  following deficits and impairments:  Abnormal gait, Decreased coordination, Decreased range of motion, Difficulty walking, Decreased endurance, Cardiopulmonary status limiting activity, Decreased activity tolerance, Decreased balance, Improper body mechanics, Postural dysfunction, Decreased strength, Decreased mobility  Visit Diagnosis: Difficulty in walking, not elsewhere classified  Muscle weakness (generalized)     Problem List Patient Active Problem List   Diagnosis Date Noted   Prostate cancer ([place]) 09/26/2012   DYSLIPIDEMIA 05/02/2007   MORBID OBESITY 05/02/2007   OBSTRUCTIVE SLEEP APNEA 05/02/2007   HYPERTENSION 05/02/2007    Sumner Boast, PT 02/07/2022, 4:52 PM  [place]. [place], [place], [address] Phone: [phone]   Fax:  [phone]  Name: Daniel [name] MRN: [id number] Date of Birth: 15-Oct-1955

## 2022-02-09 ENCOUNTER — Ambulatory Visit: Payer: Medicare PPO | Admitting: Physical Therapy

## 2022-02-14 ENCOUNTER — Ambulatory Visit: Payer: Medicare PPO | Admitting: Physical Therapy

## 2022-02-14 ENCOUNTER — Encounter: Payer: Self-pay | Admitting: Physical Therapy

## 2022-02-14 DIAGNOSIS — R262 Difficulty in walking, not elsewhere classified: Secondary | ICD-10-CM | POA: Diagnosis not present

## 2022-02-14 DIAGNOSIS — M6281 Muscle weakness (generalized): Secondary | ICD-10-CM | POA: Diagnosis not present

## 2022-02-14 NOTE — Therapy (Deleted)
[place]. [place], [place], [address] Phone: [phone]   Fax:  [phone]  Physical Therapy Treatment  Patient Details  Name: Daniel [name] MRN: [id number] Date of Birth: [birth year] Referring Provider (PT): Repass   Encounter Date: 02/14/2022   PT End of Session - 02/14/22 1514     Visit Number 56    Date for PT Re-Evaluation 04/22/22    PT Start Time 2409    PT Stop Time 1515    PT Time Calculation (min) 60 min    Activity Tolerance Patient tolerated treatment well    Behavior During Therapy [facility] for tasks assessed/performed             Past Medical History:  Diagnosis Date   Arthritis    Diabetes mellitus without complication ([place])    Elevated PSA 08/29/2012   7.22   Hyperlipidemia    Hypertension    Obstructive sleep apnea    CPAP   Prostate cancer [facility]) Feb 2014   Spinal stenosis     Past Surgical History:  Procedure Laterality Date   KNEE SURGERY     torn meniscus   PILONIDAL CYST EXCISION     lower spine age 62   PROSTATE BIOPSY Bilateral 08/29/2012   gleason 3+3=6  Dr.Marc Nesi   RADIOACTIVE SEED IMPLANT N/A 11/19/2012   Procedure: RADIOACTIVE SEED IMPLANT;  Surgeon: Hanley Ben, MD;  [facility];  Service: Urology;  Laterality: N/A;   SPINAL CORD DECOMPRESSION     Spinal injection  2012   spinal stenosis   TONSILLECTOMY     as child age 71 or 5    There were no vitals filed for this visit.   Subjective Assessment - 02/14/22 1430     Subjective "Doing pretty good, last week was kinda tough" went to the gym thursday but kept it light    Currently in Pain? No/denies                               [facility] Adult PT Treatment/Exercise - 02/14/22 0001       Ambulation/Gait   Gait Comments gait 3 laps no device and then without rest FWW x 3 laps, total 740ft'.      Lumbar Exercises: Aerobic   UBE (Upper Arm Bike) level 5 x 6 minutes power burst last  10 seconds up to 300 - 400watts 5 out of 6 times    Nustep Level 7 x 8 minutes      Lumbar Exercises: Machines for Strengthening   Other Lumbar Machine Exercise Rows 3x10 & Lats 3x10 75lb, Chest press 45lb 2x10      Lumbar Exercises: Seated   Other Seated Lumbar Exercises lumbar stretcch with the ball rolling out      Knee/Hip Exercises: Stretches   Active Hamstring Stretch Both;20 seconds;5 reps    Other Knee/Hip Stretches GLute stretch      Knee/Hip Exercises: Machines for Strengthening   Cybex Knee Extension 25# 2x10    Cybex Knee Flexion 65# 3 sets 15    Cybex Leg Press 100# 1 set 15, 120 # 15 x                       PT Short Term Goals - 05/10/21 1505       PT SHORT TERM GOAL #1   Title give HEP that he will do  at home    Status Achieved               PT Long Term Goals - 01/10/22 1533       PT LONG TERM GOAL #1   Title independent with an advanced HEP    Status Achieved      PT LONG TERM GOAL #2   Title decrease TUG time to 18 seconds    Status Achieved      PT LONG TERM GOAL #3   Title walk with a cane or lessed AD x 500 feet    Status Partially Met      PT LONG TERM GOAL #4   Title get up from sitting without using hands    Status Partially Met                   Subjective Assessment - 02/14/22 1430     Subjective "Doing pretty good, last week was kinda tough" went to the gym Thursday but kept it light    Currently in Pain? No/denies             Patient will benefit from skilled therapeutic intervention in order to improve the following deficits and impairments:  Abnormal gait, Decreased coordination, Decreased range of motion, Difficulty walking, Decreased endurance, Cardiopulmonary status limiting activity, Decreased activity tolerance, Decreased balance, Improper body mechanics, Postural dysfunction, Decreased strength, Decreased mobility  Visit Diagnosis: Difficulty in walking, not elsewhere classified  Muscle  weakness (generalized)     Problem List Patient Active Problem List   Diagnosis Date Noted   Prostate cancer ([place]) 09/26/2012   DYSLIPIDEMIA 05/02/2007   MORBID OBESITY 05/02/2007   OBSTRUCTIVE SLEEP APNEA 05/02/2007   HYPERTENSION 05/02/2007    [name], PTA 02/14/2022, 3:17 PM  [place]. [place], [place], [address] Phone: [phone]   Fax:  [phone]  Name: Daniel [name] MRN: [id number] Date of Birth: 1955/12/18

## 2022-02-14 NOTE — Therapy (Signed)
[place]. [place], [place], [address] Phone: [phone]   Fax:  [phone]  Physical Therapy Treatment  Patient Details  Name: Daniel [name] MRN: [id number] Date of Birth: November 28, 1955 Referring Provider (PT): Repass   Encounter Date: 02/14/2022   PT End of Session - 02/14/22 1514     Visit Number 32    Date for PT Re-Evaluation 04/22/22    PT Start Time 4132    PT Stop Time 1515    PT Time Calculation (min) 60 min    Activity Tolerance Patient tolerated treatment well    Behavior During Therapy [facility] for tasks assessed/performed             Past Medical History:  Diagnosis Date   Arthritis    Diabetes mellitus without complication ([place])    Elevated PSA 08/29/2012   7.22   Hyperlipidemia    Hypertension    Obstructive sleep apnea    CPAP   Prostate cancer [facility]) Feb 2014   Spinal stenosis     Past Surgical History:  Procedure Laterality Date   KNEE SURGERY     torn meniscus   PILONIDAL CYST EXCISION     lower spine age 14   PROSTATE BIOPSY Bilateral 08/29/2012   gleason 3+3=6  Dr.Marc Nesi   RADIOACTIVE SEED IMPLANT N/A 11/19/2012   Procedure: RADIOACTIVE SEED IMPLANT;  Surgeon: Hanley Ben, MD;  [facility];  Service: Urology;  Laterality: N/A;   SPINAL CORD DECOMPRESSION     Spinal injection  2012   spinal stenosis   TONSILLECTOMY     as child age 17 or 5    There were no vitals filed for this visit.   Subjective Assessment - 02/14/22 1430     Subjective "Doing pretty good, last week was kinda tough" went to the gym thursday but kept it light    Currently in Pain? No/denies                               [facility] Adult PT Treatment/Exercise - 02/14/22 0001       Ambulation/Gait   Gait Comments gait 3 laps no device and then without rest FWW x 3 laps, total 752ft'.      Lumbar Exercises: Aerobic   UBE (Upper Arm Bike) level 5 x 6 minutes power burst last  10 seconds up to 300 - 400watts 5 out of 6 times    Nustep Level 7 x 8 minutes      Lumbar Exercises: Machines for Strengthening   Other Lumbar Machine Exercise Rows 3x10 & Lats 3x10 75lb, Chest press 45lb 2x10      Lumbar Exercises: Seated   Other Seated Lumbar Exercises lumbar stretcch with the ball rolling out      Knee/Hip Exercises: Stretches   Active Hamstring Stretch Both;20 seconds;5 reps    Other Knee/Hip Stretches GLute stretch      Knee/Hip Exercises: Machines for Strengthening   Cybex Knee Extension 25# 2x10    Cybex Knee Flexion 65# 3 sets 15    Cybex Leg Press 100# 1 set 15, 120 # 15 x                       PT Short Term Goals - 05/10/21 1505       PT SHORT TERM GOAL #1   Title give HEP that he will do  at home    Status Achieved               PT Long Term Goals - 01/10/22 1533       PT LONG TERM GOAL #1   Title independent with an advanced HEP    Status Achieved      PT LONG TERM GOAL #2   Title decrease TUG time to 18 seconds    Status Achieved      PT LONG TERM GOAL #3   Title walk with a cane or lessed AD x 500 feet    Status Partially Met      PT LONG TERM GOAL #4   Title get up from sitting without using hands    Status Partially Met                   Plan - 02/14/22 1515     Clinical Impression Statement Pt enters feeling well. He reports that he has started going to the gym. Session focused on gait and strength. Increase fatigue noted with ambulation. Postural cues needed with seated rows. Cue to hole contraction with leg extensions.    Rehab Potential Good    PT Frequency 2x / week    PT Duration 12 weeks    PT Treatment/Interventions Gait training;Neuromuscular re-education;Balance training;Therapeutic exercise;Therapeutic activities;Functional mobility training;Stair training;Patient/family education;Manual techniques    PT Next Visit Plan progress to independent gym program             Patient will  benefit from skilled therapeutic intervention in order to improve the following deficits and impairments:  Abnormal gait, Decreased coordination, Decreased range of motion, Difficulty walking, Decreased endurance, Cardiopulmonary status limiting activity, Decreased activity tolerance, Decreased balance, Improper body mechanics, Postural dysfunction, Decreased strength, Decreased mobility  Visit Diagnosis: Difficulty in walking, not elsewhere classified  Muscle weakness (generalized)     Problem List Patient Active Problem List   Diagnosis Date Noted   Prostate cancer ([place]) 09/26/2012   DYSLIPIDEMIA 05/02/2007   MORBID OBESITY 05/02/2007   OBSTRUCTIVE SLEEP APNEA 05/02/2007   HYPERTENSION 05/02/2007    [name], PTA 02/14/2022, 3:22 PM  [place]. [place], [place], [address] Phone: [phone]   Fax:  [phone]  Name: Zubair Lofton MRN: [id number] Date of Birth: Nov 15, 1955

## 2022-02-16 ENCOUNTER — Ambulatory Visit: Payer: Medicare PPO | Admitting: Physical Therapy

## 2022-02-21 ENCOUNTER — Encounter: Payer: Self-pay | Admitting: Physical Therapy

## 2022-02-21 ENCOUNTER — Ambulatory Visit: Payer: Medicare PPO | Admitting: Physical Therapy

## 2022-02-21 DIAGNOSIS — R262 Difficulty in walking, not elsewhere classified: Secondary | ICD-10-CM

## 2022-02-21 DIAGNOSIS — M6281 Muscle weakness (generalized): Secondary | ICD-10-CM

## 2022-02-21 NOTE — Therapy (Unsigned)
[place]. [place], [place], [address] Phone: [phone]   Fax:  [phone]  Physical Therapy Treatment Progress Note Reporting Period 01/12/22 to 02/21/22 for visits 91-100  See note below for Objective Data and Assessment of Progress/Goals.     Patient Details  Name: Daniel [name] MRN: [id number] Date of Birth: 08-09-1955 Referring Provider (PT): Repass   Encounter Date: 02/21/2022   PT End of Session - 02/21/22 1509     Visit Number 100    Date for PT Re-Evaluation 04/22/22    Authorization Type [employer]    PT Start Time 7494    PT Stop Time 1510    PT Time Calculation (min) 50 min    Activity Tolerance Patient tolerated treatment well    Behavior During Therapy [facility] for tasks assessed/performed             Past Medical History:  Diagnosis Date   Arthritis    Diabetes mellitus without complication ([place])    Elevated PSA 08/29/2012   7.22   Hyperlipidemia    Hypertension    Obstructive sleep apnea    CPAP   Prostate cancer [facility]) Feb 2014   Spinal stenosis     Past Surgical History:  Procedure Laterality Date   KNEE SURGERY     torn meniscus   PILONIDAL CYST EXCISION     lower spine age 23   PROSTATE BIOPSY Bilateral 08/29/2012   gleason 3+3=6  Dr.Marc Nesi   RADIOACTIVE SEED IMPLANT N/A 11/19/2012   Procedure: RADIOACTIVE SEED IMPLANT;  Surgeon: Hanley Ben, MD;  [facility];  Service: Urology;  Laterality: N/A;   SPINAL CORD DECOMPRESSION     Spinal injection  2012   spinal stenosis   TONSILLECTOMY     as child age 20 or 5    There were no vitals filed for this visit.   Subjective Assessment - 02/21/22 1431     Subjective "Knees are very sore today"    Currently in Pain? Yes    Pain Score 4     Pain Location Knee    Pain Orientation Left;Right                               [facility] Adult PT Treatment/Exercise - 02/21/22 0001       Lumbar Exercises:  Aerobic   UBE (Upper Arm Bike) level 5 x 6 minutes power burst last 10 seconds up to 300 - 400watts 5 out of 6 times    Nustep Level 7 x 8 minutes      Lumbar Exercises: Machines for Strengthening   Other Lumbar Machine Exercise Triceps 45#, biceps 20# 15# stragiht arm pulls this for core and balanc    Other Lumbar Machine Exercise Rows 3x10 & Lats 3x10 75lb, Chest press 45lb 3x10      Knee/Hip Exercises: Machines for Strengthening   Cybex Knee Extension 25# 2x15    Cybex Knee Flexion 65# 3 sets 15                       PT Short Term Goals - 05/10/21 1505       PT SHORT TERM GOAL #1   Title give HEP that he will do at home    Status Achieved               PT Long Term Goals - 02/21/22  [place] #1   Title independent with an advanced HEP    Status Achieved      PT LONG TERM GOAL #2   Title decrease TUG time to 18 seconds    Status Achieved      PT LONG TERM GOAL #3   Title walk with a cane or lessed AD x 500 feet    Status Partially Met      PT LONG TERM GOAL #4   Title get up from sitting without using hands    Status Achieved                   Plan - 02/21/22 1511     Clinical Impression Statement Pt enters with reports of sore knee wanting to focus on machine level strengthening. Postural cue needed with seated rows. Cue for full ROM required with seated hamstring curls.    Rehab Potential Good    PT Frequency 2x / week    PT Duration 12 weeks    PT Treatment/Interventions Gait training;Neuromuscular re-education;Balance training;Therapeutic exercise;Therapeutic activities;Functional mobility training;Stair training;Patient/family education;Manual techniques    PT Next Visit Plan progress to independent gym program             Patient will benefit from skilled therapeutic intervention in order to improve the following deficits and impairments:  Abnormal gait, Decreased coordination, Decreased range of motion,  Difficulty walking, Decreased endurance, Cardiopulmonary status limiting activity, Decreased activity tolerance, Decreased balance, Improper body mechanics, Postural dysfunction, Decreased strength, Decreased mobility  Visit Diagnosis: Difficulty in walking, not elsewhere classified  Muscle weakness (generalized)     Problem List Patient Active Problem List   Diagnosis Date Noted   Prostate cancer ([place]) 09/26/2012   DYSLIPIDEMIA 05/02/2007   MORBID OBESITY 05/02/2007   OBSTRUCTIVE SLEEP APNEA 05/02/2007   HYPERTENSION 05/02/2007    [name], PTA 02/21/2022, 3:12 PM  [place]. [place], [place], [address] Phone: [phone]   Fax:  [phone]  Name: Emilio Baylock MRN: [id number] Date of Birth: [birth year]

## 2022-02-23 ENCOUNTER — Ambulatory Visit: Payer: Medicare PPO | Admitting: Physical Therapy

## 2022-02-24 DIAGNOSIS — N401 Enlarged prostate with lower urinary tract symptoms: Secondary | ICD-10-CM | POA: Diagnosis not present

## 2022-02-24 DIAGNOSIS — R3915 Urgency of urination: Secondary | ICD-10-CM | POA: Diagnosis not present

## 2022-02-24 DIAGNOSIS — Z8546 Personal history of malignant neoplasm of prostate: Secondary | ICD-10-CM | POA: Diagnosis not present

## 2022-03-02 ENCOUNTER — Encounter: Payer: Self-pay | Admitting: Physical Therapy

## 2022-03-02 ENCOUNTER — Ambulatory Visit: Payer: Medicare PPO | Attending: Internal Medicine | Admitting: Physical Therapy

## 2022-03-02 DIAGNOSIS — M6281 Muscle weakness (generalized): Secondary | ICD-10-CM | POA: Diagnosis not present

## 2022-03-02 DIAGNOSIS — R262 Difficulty in walking, not elsewhere classified: Secondary | ICD-10-CM | POA: Insufficient documentation

## 2022-03-02 NOTE — Therapy (Signed)
[place]. [place], [place], [address] Phone: [phone]   Fax:  [phone]  Physical Therapy Treatment  Patient Details  Name: Daniel [name] MRN: [id number] Date of Birth: June 16, 1955 Referring Provider (PT): Repass   Encounter Date: 03/02/2022   PT End of Session - 03/02/22 1428     Visit Number 101    Date for PT Re-Evaluation 04/22/22    PT Start Time 1601    PT Stop Time 1515    PT Time Calculation (min) 60 min    Activity Tolerance Patient tolerated treatment well    Behavior During Therapy [facility] for tasks assessed/performed             Past Medical History:  Diagnosis Date   Arthritis    Diabetes mellitus without complication ([place])    Elevated PSA 08/29/2012   7.22   Hyperlipidemia    Hypertension    Obstructive sleep apnea    CPAP   Prostate cancer [facility]) Feb 2014   Spinal stenosis     Past Surgical History:  Procedure Laterality Date   KNEE SURGERY     torn meniscus   PILONIDAL CYST EXCISION     lower spine age 49   PROSTATE BIOPSY Bilateral 08/29/2012   gleason 3+3=6  Dr.Marc Nesi   RADIOACTIVE SEED IMPLANT N/A 11/19/2012   Procedure: RADIOACTIVE SEED IMPLANT;  Surgeon: Hanley Ben, MD;  [facility];  Service: Urology;  Laterality: N/A;   SPINAL CORD DECOMPRESSION     Spinal injection  2012   spinal stenosis   TONSILLECTOMY     as child age 33 or 5    There were no vitals filed for this visit.   Subjective Assessment - 03/02/22 1433     Subjective "Its time for me to get another shot in my knees"  Knees are starting to bother him    Pain Score 3     Pain Location Knee    Pain Orientation Right;Left                               [facility] Adult PT Treatment/Exercise - 03/02/22 0001       Ambulation/Gait   Gait Comments gait 2.5 laps no device 341f      Lumbar Exercises: Aerobic   UBE (Upper Arm Bike) level 5 x 6 minutes power burst last 10  seconds up to 300 - 400watts 5 out of 6 times    Nustep Level 7 x 8 minutes      Lumbar Exercises: Machines for Strengthening   Other Lumbar Machine Exercise Rows 3x15 & Lats 3x10 75lb, Chest press 55lb 3x15      Knee/Hip Exercises: Machines for Strengthening   Cybex Knee Extension 25# 3x15    Cybex Knee Flexion 65# 3 sets 15                       PT Short Term Goals - 05/10/21 1505       PT SHORT TERM GOAL #1   Title give HEP that he will do at home    Status Achieved               PT Long Term Goals - 02/21/22 1510       PT LONG TERM GOAL #1   Title independent with an advanced HEP    Status Achieved  PT LONG TERM GOAL #2   Title decrease TUG time to 18 seconds    Status Achieved      PT LONG TERM GOAL #3   Title walk with a cane or lessed AD x 500 feet    Status Partially Met      PT LONG TERM GOAL #4   Title get up from sitting without using hands    Status Achieved                   Plan - 03/02/22 1512     Clinical Impression Statement Pt continues to reports some knee pain stating it was tm for another injection. He did well with al machine level interventions. Cue for full ROM needed with leg curls. Some Le tightness reported with gait trial. Increase reps tolerated with rows.    Rehab Potential Good    PT Frequency 2x / week    PT Duration 12 weeks    PT Treatment/Interventions Gait training;Neuromuscular re-education;Balance training;Therapeutic exercise;Therapeutic activities;Functional mobility training;Stair training;Patient/family education;Manual techniques    PT Next Visit Plan progress to independent gym program             Patient will benefit from skilled therapeutic intervention in order to improve the following deficits and impairments:  Abnormal gait, Decreased coordination, Decreased range of motion, Difficulty walking, Decreased endurance, Cardiopulmonary status limiting activity, Decreased activity  tolerance, Decreased balance, Improper body mechanics, Postural dysfunction, Decreased strength, Decreased mobility  Visit Diagnosis: Difficulty in walking, not elsewhere classified  Muscle weakness (generalized)     Problem List Patient Active Problem List   Diagnosis Date Noted   Prostate cancer ([place]) 09/26/2012   DYSLIPIDEMIA 05/02/2007   MORBID OBESITY 05/02/2007   OBSTRUCTIVE SLEEP APNEA 05/02/2007   HYPERTENSION 05/02/2007    [name], PTA 03/02/2022, 3:13 PM  [place]. [place], [place], [address] Phone: [phone]   Fax:  [phone]  Name: Xylon Croom MRN: [id number] Date of Birth: 08/25/1955

## 2022-03-07 ENCOUNTER — Ambulatory Visit: Payer: Medicare PPO | Admitting: Physical Therapy

## 2022-03-07 ENCOUNTER — Encounter: Payer: Self-pay | Admitting: Physical Therapy

## 2022-03-07 DIAGNOSIS — M6281 Muscle weakness (generalized): Secondary | ICD-10-CM | POA: Diagnosis not present

## 2022-03-07 DIAGNOSIS — R262 Difficulty in walking, not elsewhere classified: Secondary | ICD-10-CM

## 2022-03-07 NOTE — Therapy (Signed)
[place]. [place], [place], [address] Phone: [phone]   Fax:  [phone]  Physical Therapy Treatment  Patient Details  Name: Daniel [name] MRN: [id number] Date of Birth: 05-05-1955 Referring Provider (PT): Repass   Encounter Date: 03/07/2022   PT End of Session - 03/07/22 1513     Visit Number 102    Date for PT Re-Evaluation 04/22/22    PT Start Time 5945    PT Stop Time 8592    PT Time Calculation (min) 55 min    Activity Tolerance Patient tolerated treatment well             Past Medical History:  Diagnosis Date   Arthritis    Diabetes mellitus without complication ([place])    Elevated PSA 08/29/2012   7.22   Hyperlipidemia    Hypertension    Obstructive sleep apnea    CPAP   Prostate cancer [facility]) Feb 2014   Spinal stenosis     Past Surgical History:  Procedure Laterality Date   KNEE SURGERY     torn meniscus   PILONIDAL CYST EXCISION     lower spine age 43   PROSTATE BIOPSY Bilateral 08/29/2012   gleason 3+3=6  Dr.Marc Nesi   RADIOACTIVE SEED IMPLANT N/A 11/19/2012   Procedure: RADIOACTIVE SEED IMPLANT;  Surgeon: Hanley Ben, MD;  Location: [facility];  Service: Urology;  Laterality: N/A;   SPINAL CORD DECOMPRESSION     Spinal injection  2012   spinal stenosis   TONSILLECTOMY     as child age 38 or 5    There were no vitals filed for this visit.   Subjective Assessment - 03/07/22 1428     Subjective Knees feel better than they did last week.    Currently in Pain? Yes    Pain Score 2     Pain Location Knee    Pain Orientation Left;Right                               [facility] Adult PT Treatment/Exercise - 03/07/22 0001       Ambulation/Gait   Gait Comments Gait 2 laps one lap with tand without AD      Lumbar Exercises: Machines for Strengthening   Other Lumbar Machine Exercise Rows 3x15 & Lats 3x10 55lb, Chest press 55lb 3x15      Knee/Hip Exercises:  Machines for Strengthening   Cybex Knee Extension 25# 3x15    Cybex Knee Flexion 55# 3 sets 15    Cybex Leg Press 120lb 3x15                       PT Short Term Goals - 05/10/21 1505       PT SHORT TERM GOAL #1   Title give HEP that he will do at home    Status Achieved               PT Long Term Goals - 02/21/22 1510       PT LONG TERM GOAL #1   Title independent with an advanced HEP    Status Achieved      PT LONG TERM GOAL #2   Title decrease TUG time to 18 seconds    Status Achieved      PT LONG TERM GOAL #3   Title walk with a cane or lessed AD x 500 feet  Status Partially Met      PT LONG TERM GOAL #4   Title get up from sitting without using hands    Status Achieved                   Plan - 03/07/22 1514     Clinical Impression Statement Pt enters with less knee pain compared to last session. Continued with machine level strengthening per patient request. Some resistance was lessened for full completion of reps. He reports that he still continues to go to the gym    Rehab Potential Good    PT Frequency 2x / week    PT Treatment/Interventions Gait training;Neuromuscular re-education;Balance training;Therapeutic exercise;Therapeutic activities;Functional mobility training;Stair training;Patient/family education;Manual techniques    PT Next Visit Plan progress to independent gym program             Patient will benefit from skilled therapeutic intervention in order to improve the following deficits and impairments:  Abnormal gait, Decreased coordination, Decreased range of motion, Difficulty walking, Decreased endurance, Cardiopulmonary status limiting activity, Decreased activity tolerance, Decreased balance, Improper body mechanics, Postural dysfunction, Decreased strength, Decreased mobility  Visit Diagnosis: Difficulty in walking, not elsewhere classified  Muscle weakness (generalized)     Problem List Patient Active  Problem List   Diagnosis Date Noted   Prostate cancer ([place]) 09/26/2012   DYSLIPIDEMIA 05/02/2007   MORBID OBESITY 05/02/2007   OBSTRUCTIVE SLEEP APNEA 05/02/2007   HYPERTENSION 05/02/2007    [name], PTA 03/07/2022, 3:16 PM  [place]. [place], [place], [address] Phone: [phone]   Fax:  [phone]  Name: Daniel [name] MRN: [id number] Date of Birth: 01-03-1956

## 2022-03-14 ENCOUNTER — Encounter: Payer: Self-pay | Admitting: Physical Therapy

## 2022-03-14 ENCOUNTER — Ambulatory Visit: Payer: Medicare PPO | Admitting: Physical Therapy

## 2022-03-14 DIAGNOSIS — R262 Difficulty in walking, not elsewhere classified: Secondary | ICD-10-CM

## 2022-03-14 DIAGNOSIS — M6281 Muscle weakness (generalized): Secondary | ICD-10-CM

## 2022-03-14 NOTE — Therapy (Signed)
[place]. [place], [place], [address] Phone: [phone]   Fax:  [phone]  Physical Therapy Treatment  Patient Details  Name: Daniel [name] MRN: [id number] Date of Birth: 17-Jun-1955 Referring Provider (PT): Repass   Encounter Date: 03/14/2022   PT End of Session - 03/14/22 1516     Visit Number 103    Date for PT Re-Evaluation 04/22/22    PT Start Time 2263    PT Stop Time 1515    PT Time Calculation (min) 55 min    Activity Tolerance Patient tolerated treatment well    Behavior During Therapy [facility] for tasks assessed/performed             Past Medical History:  Diagnosis Date   Arthritis    Diabetes mellitus without complication ([place])    Elevated PSA 08/29/2012   7.22   Hyperlipidemia    Hypertension    Obstructive sleep apnea    CPAP   Prostate cancer [facility]) Feb 2014   Spinal stenosis     Past Surgical History:  Procedure Laterality Date   KNEE SURGERY     torn meniscus   PILONIDAL CYST EXCISION     lower spine age 77   PROSTATE BIOPSY Bilateral 08/29/2012   gleason 3+3=6  Dr.Marc Nesi   RADIOACTIVE SEED IMPLANT N/A 11/19/2012   Procedure: RADIOACTIVE SEED IMPLANT;  Surgeon: Hanley Ben, MD;  [facility];  Service: Urology;  Laterality: N/A;   SPINAL CORD DECOMPRESSION     Spinal injection  2012   spinal stenosis   TONSILLECTOMY     as child age 17 or 5    There were no vitals filed for this visit.   Subjective Assessment - 03/14/22 1429     Subjective Pretty good, some "Strange" in the L knee. Not pain    Currently in Pain? Yes    Pain Score 2     Pain Location Knee    Pain Orientation Left;Right                               [facility] Adult PT Treatment/Exercise - 03/14/22 0001       Ambulation/Gait   Gait Comments gait 2 laps no device and then without rest FWW x 1 laps, total 334f'.      Lumbar Exercises: Aerobic   UBE (Upper Arm Bike) level 5  x 6 minutes power burst last 10 seconds up to 300 - 400watts 5 out of 6 times    Nustep Level 8 x 8 minutes      Lumbar Exercises: Machines for Strengthening   Other Lumbar Machine Exercise Rows 75lb 3x15, Lats 55lb 3x15, Chest press 55lb 3x10      Knee/Hip Exercises: Machines for Strengthening   Cybex Knee Extension 25# 3x15    Cybex Knee Flexion 55# 3 sets 15    Cybex Leg Press 120lb 3x15                       PT Short Term Goals - 05/10/21 1505       PT SHORT TERM GOAL #1   Title give HEP that he will do at home    Status Achieved               PT Long Term Goals - 02/21/22 1510       PT LONG TERM GOAL #1  Title independent with an advanced HEP    Status Achieved      PT LONG TERM GOAL #2   Title decrease TUG time to 18 seconds    Status Achieved      PT LONG TERM GOAL #3   Title walk with a cane or lessed AD x 500 feet    Status Partially Met      PT LONG TERM GOAL #4   Title get up from sitting without using hands    Status Achieved                   Plan - 03/14/22 1517     Clinical Impression Statement Pt continues to reports some issues with knee that's is limiting his ability with gait. Continues to do well with machine eleven issues. Postural cue required with stated lat pull downs. Continues to push function and strength    Rehab Potential Good    PT Frequency 2x / week    PT Duration 12 weeks    PT Treatment/Interventions Gait training;Neuromuscular re-education;Balance training;Therapeutic exercise;Therapeutic activities;Functional mobility training;Stair training;Patient/family education;Manual techniques    PT Next Visit Plan progress to independent gym program             Patient will benefit from skilled therapeutic intervention in order to improve the following deficits and impairments:  Abnormal gait, Decreased coordination, Decreased range of motion, Difficulty walking, Decreased endurance, Cardiopulmonary  status limiting activity, Decreased activity tolerance, Decreased balance, Improper body mechanics, Postural dysfunction, Decreased strength, Decreased mobility  Visit Diagnosis: Difficulty in walking, not elsewhere classified  Muscle weakness (generalized)     Problem List Patient Active Problem List   Diagnosis Date Noted   Prostate cancer ([place]) 09/26/2012   DYSLIPIDEMIA 05/02/2007   MORBID OBESITY 05/02/2007   OBSTRUCTIVE SLEEP APNEA 05/02/2007   HYPERTENSION 05/02/2007    [name], PTA 03/14/2022, 3:18 PM  [place]. [place], [place], [address] Phone: [phone]   Fax:  [phone]  Name: Avyon Herendeen MRN: [id number] Date of Birth: 1955/08/30

## 2022-03-20 DIAGNOSIS — N529 Male erectile dysfunction, unspecified: Secondary | ICD-10-CM | POA: Diagnosis not present

## 2022-03-20 DIAGNOSIS — E1165 Type 2 diabetes mellitus with hyperglycemia: Secondary | ICD-10-CM | POA: Diagnosis not present

## 2022-03-20 DIAGNOSIS — M170 Bilateral primary osteoarthritis of knee: Secondary | ICD-10-CM | POA: Diagnosis not present

## 2022-03-20 DIAGNOSIS — Z0001 Encounter for general adult medical examination with abnormal findings: Secondary | ICD-10-CM | POA: Diagnosis not present

## 2022-03-20 DIAGNOSIS — E782 Mixed hyperlipidemia: Secondary | ICD-10-CM | POA: Diagnosis not present

## 2022-03-20 DIAGNOSIS — I10 Essential (primary) hypertension: Secondary | ICD-10-CM | POA: Diagnosis not present

## 2022-03-20 DIAGNOSIS — I82511 Chronic embolism and thrombosis of right femoral vein: Secondary | ICD-10-CM | POA: Diagnosis not present

## 2022-03-20 DIAGNOSIS — D696 Thrombocytopenia, unspecified: Secondary | ICD-10-CM | POA: Diagnosis not present

## 2022-03-21 ENCOUNTER — Ambulatory Visit: Payer: Medicare PPO | Admitting: Physical Therapy

## 2022-03-21 ENCOUNTER — Encounter: Payer: Self-pay | Admitting: Physical Therapy

## 2022-03-21 DIAGNOSIS — R262 Difficulty in walking, not elsewhere classified: Secondary | ICD-10-CM

## 2022-03-21 DIAGNOSIS — M6281 Muscle weakness (generalized): Secondary | ICD-10-CM | POA: Diagnosis not present

## 2022-03-21 NOTE — Therapy (Signed)
[place]. [place], [place], [address] Phone: [phone]   Fax:  [phone]  Physical Therapy Treatment  Patient Details  Name: Daniel [name] MRN: [id number] Date of Birth: 18-Sep-1955 Referring Provider (PT): Repass   Encounter Date: 03/21/2022   PT End of Session - 03/21/22 1449     Visit Number 104    Date for PT Re-Evaluation 04/22/22    Authorization Type [employer]    PT Start Time 1430    PT Stop Time 1530    PT Time Calculation (min) 60 min    Equipment Utilized During Treatment Gait belt    Activity Tolerance Patient tolerated treatment well    Behavior During Therapy [facility] for tasks assessed/performed             Past Medical History:  Diagnosis Date   Arthritis    Diabetes mellitus without complication ([place])    Elevated PSA 08/29/2012   7.22   Hyperlipidemia    Hypertension    Obstructive sleep apnea    CPAP   Prostate cancer [facility]) Feb 2014   Spinal stenosis     Past Surgical History:  Procedure Laterality Date   KNEE SURGERY     torn meniscus   PILONIDAL CYST EXCISION     lower spine age 68   PROSTATE BIOPSY Bilateral 08/29/2012   gleason 3+3=6  Dr.Marc Nesi   RADIOACTIVE SEED IMPLANT N/A 11/19/2012   Procedure: RADIOACTIVE SEED IMPLANT;  Surgeon: Hanley Ben, MD;  [facility];  Service: Urology;  Laterality: N/A;   SPINAL CORD DECOMPRESSION     Spinal injection  2012   spinal stenosis   TONSILLECTOMY     as child age 64 or 5    There were no vitals filed for this visit.   Subjective Assessment - 03/21/22 1451     Subjective Doing okay, did not go to the gym last week    Currently in Pain? Yes    Pain Score 2     Pain Location Knee    Pain Orientation Right;Left                               [facility] Adult PT Treatment/Exercise - 03/21/22 0001       Ambulation/Gait   Gait Comments gait 3 laps wihtout walker and 3 laps with walker 720 feet total       Lumbar Exercises: Stretches   Other Lumbar Stretch Exercise lumbar stretch with pball      Lumbar Exercises: Aerobic   UBE (Upper Arm Bike) lvel 5 x 6 minutes 3x power burst    Nustep level 8 x 8 minutes 3 x had him get above 80SPM for 45 seconds      Lumbar Exercises: Machines for Strengthening   Leg Press 140# 3x12    Other Lumbar Machine Exercise 55# lats, 75# rows, 55# chest press, all 3x15                       PT Short Term Goals - 05/10/21 1505       PT SHORT TERM GOAL #1   Title give HEP that he will do at home    Status Achieved               PT Long Term Goals - 03/21/22 1451       PT LONG TERM GOAL #1  Title independent with an advanced HEP    Status Achieved      PT LONG TERM GOAL #2   Title decrease TUG time to 18 seconds    Status Achieved      PT LONG TERM GOAL #3   Title walk with a cane or lessed AD x 500 feet    Status Partially Met      PT LONG TERM GOAL #4   Title get up from sitting without using hands    Status Achieved                    Patient will benefit from skilled therapeutic intervention in order to improve the following deficits and impairments:     Visit Diagnosis: Difficulty in walking, not elsewhere classified  Muscle weakness (generalized)     Problem List Patient Active Problem List   Diagnosis Date Noted   Prostate cancer ([place]) 09/26/2012   DYSLIPIDEMIA 05/02/2007   MORBID OBESITY 05/02/2007   OBSTRUCTIVE SLEEP APNEA 05/02/2007   HYPERTENSION 05/02/2007    Sumner Boast, PT 03/21/2022, 3:25 PM  [place]. [place], [place], [address] Phone: [phone]   Fax:  [phone]  Name: Daniel [name] MRN: [id number] Date of Birth: 1955/11/11

## 2022-03-28 ENCOUNTER — Encounter: Payer: Self-pay | Admitting: Physical Therapy

## 2022-03-28 ENCOUNTER — Ambulatory Visit: Payer: Medicare PPO | Attending: Internal Medicine | Admitting: Physical Therapy

## 2022-03-28 DIAGNOSIS — M6281 Muscle weakness (generalized): Secondary | ICD-10-CM | POA: Diagnosis not present

## 2022-03-28 DIAGNOSIS — R262 Difficulty in walking, not elsewhere classified: Secondary | ICD-10-CM | POA: Diagnosis not present

## 2022-03-28 NOTE — Therapy (Signed)
[place]. [place], [place], [address] Phone: [phone]   Fax:  [phone]  Physical Therapy Treatment  Patient Details  Name: Harshal Sirmon MRN: [id number] Date of Birth: 04-23-1956 Referring Provider (PT): Repass   Encounter Date: 03/28/2022   PT End of Session - 03/28/22 1513     Visit Number 105    Date for PT Re-Evaluation 04/22/22    PT Start Time 1430    PT Stop Time 1515    PT Time Calculation (min) 45 min    Activity Tolerance Patient tolerated treatment well    Behavior During Therapy [facility] for tasks assessed/performed             Past Medical History:  Diagnosis Date   Arthritis    Diabetes mellitus without complication ([place])    Elevated PSA 08/29/2012   7.22   Hyperlipidemia    Hypertension    Obstructive sleep apnea    CPAP   Prostate cancer [facility]) Feb 2014   Spinal stenosis     Past Surgical History:  Procedure Laterality Date   KNEE SURGERY     torn meniscus   PILONIDAL CYST EXCISION     lower spine age 66   PROSTATE BIOPSY Bilateral 08/29/2012   gleason 3+3=6  Dr.Marc Nesi   RADIOACTIVE SEED IMPLANT N/A 11/19/2012   Procedure: RADIOACTIVE SEED IMPLANT;  Surgeon: Hanley Ben, MD;  [facility];  Service: Urology;  Laterality: N/A;   SPINAL CORD DECOMPRESSION     Spinal injection  2012   spinal stenosis   TONSILLECTOMY     as child age 66 or 5    There were no vitals filed for this visit.   Subjective Assessment - 03/28/22 1433     Subjective "Pretty good"    Currently in Pain? Yes    Pain Score 2     Pain Location Knee    Pain Orientation Left;Right                               [facility] Adult PT Treatment/Exercise - 03/28/22 0001       Ambulation/Gait   Gait Comments gait 3 laps wihtout walker and 2 laps with walker 620 feet total      Lumbar Exercises: Aerobic   Nustep level 8 x 8 minutes\      Lumbar Exercises: Machines for  Strengthening   Leg Press 150# 2x12    Other Lumbar Machine Exercise 55# lats, 75# rows, 55# chest press, all 2x15      Knee/Hip Exercises: Machines for Strengthening   Cybex Knee Extension 25# 2x15    Cybex Knee Flexion 55# 2 sets 15                       PT Short Term Goals - 05/10/21 1505       PT SHORT TERM GOAL #1   Title give HEP that he will do at home    Status Achieved               PT Long Term Goals - 03/21/22 1451       PT LONG TERM GOAL #1   Title independent with an advanced HEP    Status Achieved      PT LONG TERM GOAL #2   Title decrease TUG time to 18 seconds    Status Achieved  PT LONG TERM GOAL #3   Title walk with a cane or lessed AD x 500 feet    Status Partially Met      PT LONG TERM GOAL #4   Title get up from sitting without using hands    Status Achieved                   Plan - 03/28/22 1513     Clinical Impression Statement Pt again enters with reports of usual knee discomfort, that doe limit his gait ability. No issues with machine level interventions. Cues needed to prevent posterior trunk lean with seated rows. Will continue to progress function and strength    Rehab Potential Good    PT Frequency 2x / week    PT Duration 12 weeks    PT Treatment/Interventions Gait training;Neuromuscular re-education;Balance training;Therapeutic exercise;Therapeutic activities;Functional mobility training;Stair training;Patient/family education;Manual techniques    PT Next Visit Plan progress to independent gym program             Patient will benefit from skilled therapeutic intervention in order to improve the following deficits and impairments:  Abnormal gait, Decreased coordination, Decreased range of motion, Difficulty walking, Decreased endurance, Cardiopulmonary status limiting activity, Decreased activity tolerance, Decreased balance, Improper body mechanics, Postural dysfunction, Decreased strength, Decreased  mobility  Visit Diagnosis: Difficulty in walking, not elsewhere classified  Muscle weakness (generalized)     Problem List Patient Active Problem List   Diagnosis Date Noted   Prostate cancer ([place]) 09/26/2012   DYSLIPIDEMIA 05/02/2007   MORBID OBESITY 05/02/2007   OBSTRUCTIVE SLEEP APNEA 05/02/2007   HYPERTENSION 05/02/2007    [name], PTA 03/28/2022, 3:15 PM  [place]. [place], [place], [address] Phone: [phone]   Fax:  [phone]  Name: Sulaiman Imbert MRN: [id number] Date of Birth: [birth year]

## 2022-04-03 DIAGNOSIS — E1165 Type 2 diabetes mellitus with hyperglycemia: Secondary | ICD-10-CM | POA: Diagnosis not present

## 2022-04-03 DIAGNOSIS — M170 Bilateral primary osteoarthritis of knee: Secondary | ICD-10-CM | POA: Diagnosis not present

## 2022-04-03 DIAGNOSIS — I82511 Chronic embolism and thrombosis of right femoral vein: Secondary | ICD-10-CM | POA: Diagnosis not present

## 2022-04-03 DIAGNOSIS — I10 Essential (primary) hypertension: Secondary | ICD-10-CM | POA: Diagnosis not present

## 2022-04-03 DIAGNOSIS — N529 Male erectile dysfunction, unspecified: Secondary | ICD-10-CM | POA: Diagnosis not present

## 2022-04-03 DIAGNOSIS — E782 Mixed hyperlipidemia: Secondary | ICD-10-CM | POA: Diagnosis not present

## 2022-04-04 ENCOUNTER — Ambulatory Visit: Payer: Medicare PPO | Admitting: Physical Therapy

## 2022-04-04 ENCOUNTER — Encounter: Payer: Self-pay | Admitting: Physical Therapy

## 2022-04-04 DIAGNOSIS — M6281 Muscle weakness (generalized): Secondary | ICD-10-CM

## 2022-04-04 DIAGNOSIS — R262 Difficulty in walking, not elsewhere classified: Secondary | ICD-10-CM

## 2022-04-04 NOTE — Therapy (Signed)
[place]. [place], [place], [address] Phone: [phone]   Fax:  [phone]  Physical Therapy Treatment  Patient Details  Name: Daniel [name] MRN: [id number] Date of Birth: Sep 18, 1955 Referring Provider (PT): Repass   Encounter Date: 04/04/2022   PT End of Session - 04/04/22 1424     Visit Number 106    Date for PT Re-Evaluation 04/22/22    PT Start Time 1443    PT Stop Time 1515    PT Time Calculation (min) 60 min    Activity Tolerance Patient tolerated treatment well    Behavior During Therapy [facility] for tasks assessed/performed             Past Medical History:  Diagnosis Date   Arthritis    Diabetes mellitus without complication ([place])    Elevated PSA 08/29/2012   7.22   Hyperlipidemia    Hypertension    Obstructive sleep apnea    CPAP   Prostate cancer [facility]) Feb 2014   Spinal stenosis     Past Surgical History:  Procedure Laterality Date   KNEE SURGERY     torn meniscus   PILONIDAL CYST EXCISION     lower spine age 35   PROSTATE BIOPSY Bilateral 08/29/2012   gleason 3+3=6  Dr.Marc Nesi   RADIOACTIVE SEED IMPLANT N/A 11/19/2012   Procedure: RADIOACTIVE SEED IMPLANT;  Surgeon: Hanley Ben, MD;  [facility];  Service: Urology;  Laterality: N/A;   SPINAL CORD DECOMPRESSION     Spinal injection  2012   spinal stenosis   TONSILLECTOMY     as child age 44 or 5    There were no vitals filed for this visit.   Subjective Assessment - 04/04/22 1425     Subjective "Pretty good"    Currently in Pain? No/denies                               [facility] Adult PT Treatment/Exercise - 04/04/22 0001       Ambulation/Gait   Gait Comments gait 3 laps wihtout walker and 3 laps with walker 740 feet total      Lumbar Exercises: Stretches   Other Lumbar Stretch Exercise lumbar stretch with pball      Lumbar Exercises: Aerobic   UBE (Upper Arm Bike) lvel 5 x 6 minutes 3x  power burst    Nustep level 8 x 8 minutes\      Lumbar Exercises: Machines for Strengthening   Leg Press 150# 2x12    Other Lumbar Machine Exercise 55# lats, 75# rows, 55# chest press, all 2x15      Knee/Hip Exercises: Stretches   Active Hamstring Stretch Both;20 seconds;5 reps      Knee/Hip Exercises: Machines for Strengthening   Cybex Knee Extension 25# 3x15    Cybex Knee Flexion 55# 3 sets 15                       PT Short Term Goals - 05/10/21 1505       PT SHORT TERM GOAL #1   Title give HEP that he will do at home    Status Achieved               PT Long Term Goals - 03/21/22 1451       PT LONG TERM GOAL #1   Title independent with an advanced HEP  Status Achieved      PT LONG TERM GOAL #2   Title decrease TUG time to 18 seconds    Status Achieved      PT LONG TERM GOAL #3   Title walk with a cane or lessed AD x 500 feet    Status Partially Met      PT LONG TERM GOAL #4   Title get up from sitting without using hands    Status Achieved                   Plan - 04/04/22 1511     Clinical Impression Statement Pt arrives feeling well. Still progressing with his strength and function. Encouragement needed to ambulate a extra lap when fatigue set in. Increase sets tolerated with leg curls and extensions. Pt reports going to the gym one day a week in addition to therapy    Rehab Potential Good    PT Frequency 2x / week    PT Duration 12 weeks    PT Treatment/Interventions Gait training;Neuromuscular re-education;Balance training;Therapeutic exercise;Therapeutic activities;Functional mobility training;Stair training;Patient/family education;Manual techniques    PT Next Visit Plan progress to independent gym program             Patient will benefit from skilled therapeutic intervention in order to improve the following deficits and impairments:  Abnormal gait, Decreased coordination, Decreased range of motion, Difficulty walking,  Decreased endurance, Cardiopulmonary status limiting activity, Decreased activity tolerance, Decreased balance, Improper body mechanics, Postural dysfunction, Decreased strength, Decreased mobility  Visit Diagnosis: Difficulty in walking, not elsewhere classified  Muscle weakness (generalized)     Problem List Patient Active Problem List   Diagnosis Date Noted   Prostate cancer ([place]) 09/26/2012   DYSLIPIDEMIA 05/02/2007   MORBID OBESITY 05/02/2007   OBSTRUCTIVE SLEEP APNEA 05/02/2007   HYPERTENSION 05/02/2007    [name], PTA 04/04/2022, 3:14 PM  [place]. [place], [place], [address] Phone: [phone]   Fax:  [phone]  Name: Daniel [name] MRN: [id number] Date of Birth: 1956-04-15

## 2022-04-06 DIAGNOSIS — Z8546 Personal history of malignant neoplasm of prostate: Secondary | ICD-10-CM | POA: Diagnosis not present

## 2022-04-06 DIAGNOSIS — R3915 Urgency of urination: Secondary | ICD-10-CM | POA: Diagnosis not present

## 2022-04-11 ENCOUNTER — Encounter: Payer: Self-pay | Admitting: Physical Therapy

## 2022-04-11 ENCOUNTER — Ambulatory Visit: Payer: Medicare PPO | Admitting: Physical Therapy

## 2022-04-11 DIAGNOSIS — R262 Difficulty in walking, not elsewhere classified: Secondary | ICD-10-CM | POA: Diagnosis not present

## 2022-04-11 DIAGNOSIS — M6281 Muscle weakness (generalized): Secondary | ICD-10-CM | POA: Diagnosis not present

## 2022-04-11 NOTE — Therapy (Signed)
[place] [place], [place], [address] Phone: [phone]   Fax:  [phone]  Physical Therapy Treatment  Patient Details  Name: Daniel [name] MRN: [id number] Date of Birth: 10/27/1955 Referring Provider (PT): Repass   Encounter Date: 04/11/2022   PT End of Session - 04/11/22 1435     Visit Number [id number]    Date for PT Re-Evaluation 04/22/22    PT Start Time 1430    PT Stop Time 1525    PT Time Calculation (min) 55 min    Equipment Utilized During Treatment Gait belt    Activity Tolerance Patient tolerated treatment well    Behavior During Therapy [facility] for tasks assessed/performed             Past Medical History:  Diagnosis Date   Arthritis    Diabetes mellitus without complication ([place])    Elevated PSA 08/29/2012   7.22   Hyperlipidemia    Hypertension    Obstructive sleep apnea    CPAP   Prostate cancer [facility]) Feb 2014   Spinal stenosis     Past Surgical History:  Procedure Laterality Date   KNEE SURGERY     torn meniscus   PILONIDAL CYST EXCISION     lower spine age 48   PROSTATE BIOPSY Bilateral 08/29/2012   gleason 3+3=6  Dr.Marc Nesi   RADIOACTIVE SEED IMPLANT N/A 11/19/2012   Procedure: RADIOACTIVE SEED IMPLANT;  Surgeon: Hanley Ben, MD;  Location: [facility];  Service: Urology;  Laterality: N/A;   SPINAL CORD DECOMPRESSION     Spinal injection  2012   spinal stenosis   TONSILLECTOMY     as child age 60 or 5    There were no vitals filed for this visit.   Subjective Assessment - 04/11/22 1437     Subjective I think I am ready to do my own thing    Currently in Pain? No/denies                               [facility] Adult PT Treatment/Exercise - 04/11/22 0001       Self-Care   Self-Care --   went over gym activities, how to adjust, push himself with time, weight, reps, intensity,safety etc...     Lumbar Exercises: Aerobic   Nustep level 8 x 8 minutes       Lumbar Exercises: Machines for Strengthening   Leg Press 140# 3x10    Other Lumbar Machine Exercise chest press 65#                       PT Short Term Goals - 05/10/21 1505       PT SHORT TERM GOAL #1   Title give HEP that he will do at home    Status Achieved               PT Long Term Goals - 04/11/22 1437       PT LONG TERM GOAL #1   Title independent with an advanced HEP    Status Achieved      PT LONG TERM GOAL #2   Title decrease TUG time to 18 seconds    Status Achieved      PT LONG TERM GOAL #3   Title walk with a cane or lessed AD x 500 feet    Status Not Met  PT LONG TERM GOAL #4   Title get up from sitting without using hands    Status Achieved                   Plan - 04/11/22 1538     Clinical Impression Statement We went over a lot of stuff about HEP and gym, how to motivate himself, how to push himself and challenge and change the exercises to progress, went over safety as well, answered his quesitons    PT Next Visit Plan D/C goals met    Consulted and Agree with Plan of Care Patient             Patient will benefit from skilled therapeutic intervention in order to improve the following deficits and impairments:  Abnormal gait, Decreased coordination, Decreased range of motion, Difficulty walking, Decreased endurance, Cardiopulmonary status limiting activity, Decreased activity tolerance, Decreased balance, Improper body mechanics, Postural dysfunction, Decreased strength, Decreased mobility  Visit Diagnosis: Difficulty in walking, not elsewhere classified  Muscle weakness (generalized)     Problem List Patient Active Problem List   Diagnosis Date Noted   Prostate cancer ([place]) 09/26/2012   DYSLIPIDEMIA 05/02/2007   MORBID OBESITY 05/02/2007   OBSTRUCTIVE SLEEP APNEA 05/02/2007   HYPERTENSION 05/02/2007    Sumner Boast, PT 04/11/2022, 3:40 PM  [place] [place], [place], [address] Phone: [phone]   Fax:  [phone]  Name: Tavarus Poteete MRN: [id number] Date of Birth: May 16, 1955

## 2022-05-12 DIAGNOSIS — M170 Bilateral primary osteoarthritis of knee: Secondary | ICD-10-CM | POA: Diagnosis not present

## 2022-05-15 DIAGNOSIS — H5201 Hypermetropia, right eye: Secondary | ICD-10-CM | POA: Diagnosis not present

## 2022-05-15 DIAGNOSIS — H401131 Primary open-angle glaucoma, bilateral, mild stage: Secondary | ICD-10-CM | POA: Diagnosis not present

## 2022-05-15 DIAGNOSIS — H25013 Cortical age-related cataract, bilateral: Secondary | ICD-10-CM | POA: Diagnosis not present

## 2022-05-15 DIAGNOSIS — H524 Presbyopia: Secondary | ICD-10-CM | POA: Diagnosis not present

## 2022-05-15 DIAGNOSIS — H2513 Age-related nuclear cataract, bilateral: Secondary | ICD-10-CM | POA: Diagnosis not present

## 2022-05-15 DIAGNOSIS — H52203 Unspecified astigmatism, bilateral: Secondary | ICD-10-CM | POA: Diagnosis not present

## 2022-07-03 DIAGNOSIS — Z125 Encounter for screening for malignant neoplasm of prostate: Secondary | ICD-10-CM | POA: Diagnosis not present

## 2022-07-03 DIAGNOSIS — G4733 Obstructive sleep apnea (adult) (pediatric): Secondary | ICD-10-CM | POA: Diagnosis not present

## 2022-07-03 DIAGNOSIS — I82511 Chronic embolism and thrombosis of right femoral vein: Secondary | ICD-10-CM | POA: Diagnosis not present

## 2022-07-03 DIAGNOSIS — M170 Bilateral primary osteoarthritis of knee: Secondary | ICD-10-CM | POA: Diagnosis not present

## 2022-07-03 DIAGNOSIS — Z1329 Encounter for screening for other suspected endocrine disorder: Secondary | ICD-10-CM | POA: Diagnosis not present

## 2022-07-03 DIAGNOSIS — E1165 Type 2 diabetes mellitus with hyperglycemia: Secondary | ICD-10-CM | POA: Diagnosis not present

## 2022-07-03 DIAGNOSIS — N529 Male erectile dysfunction, unspecified: Secondary | ICD-10-CM | POA: Diagnosis not present

## 2022-07-03 DIAGNOSIS — E782 Mixed hyperlipidemia: Secondary | ICD-10-CM | POA: Diagnosis not present

## 2022-07-03 DIAGNOSIS — I10 Essential (primary) hypertension: Secondary | ICD-10-CM | POA: Diagnosis not present

## 2022-07-03 DIAGNOSIS — Z0001 Encounter for general adult medical examination with abnormal findings: Secondary | ICD-10-CM | POA: Diagnosis not present

## 2022-07-07 DIAGNOSIS — R3915 Urgency of urination: Secondary | ICD-10-CM | POA: Diagnosis not present

## 2022-07-07 DIAGNOSIS — Z8546 Personal history of malignant neoplasm of prostate: Secondary | ICD-10-CM | POA: Diagnosis not present

## 2022-07-07 DIAGNOSIS — N529 Male erectile dysfunction, unspecified: Secondary | ICD-10-CM | POA: Diagnosis not present

## 2022-07-17 DIAGNOSIS — G4733 Obstructive sleep apnea (adult) (pediatric): Secondary | ICD-10-CM | POA: Diagnosis not present

## 2022-07-17 DIAGNOSIS — R809 Proteinuria, unspecified: Secondary | ICD-10-CM | POA: Diagnosis not present

## 2022-07-17 DIAGNOSIS — M170 Bilateral primary osteoarthritis of knee: Secondary | ICD-10-CM | POA: Diagnosis not present

## 2022-07-17 DIAGNOSIS — E782 Mixed hyperlipidemia: Secondary | ICD-10-CM | POA: Diagnosis not present

## 2022-07-17 DIAGNOSIS — I82511 Chronic embolism and thrombosis of right femoral vein: Secondary | ICD-10-CM | POA: Diagnosis not present

## 2022-07-17 DIAGNOSIS — I10 Essential (primary) hypertension: Secondary | ICD-10-CM | POA: Diagnosis not present

## 2022-07-17 DIAGNOSIS — N529 Male erectile dysfunction, unspecified: Secondary | ICD-10-CM | POA: Diagnosis not present

## 2022-07-17 DIAGNOSIS — E1129 Type 2 diabetes mellitus with other diabetic kidney complication: Secondary | ICD-10-CM | POA: Diagnosis not present

## 2022-07-21 DIAGNOSIS — G4733 Obstructive sleep apnea (adult) (pediatric): Secondary | ICD-10-CM | POA: Diagnosis not present

## 2022-07-25 DIAGNOSIS — G4733 Obstructive sleep apnea (adult) (pediatric): Secondary | ICD-10-CM | POA: Diagnosis not present

## 2022-08-19 IMAGING — US US EXTREM LOW VENOUS*R*
1 series · 14 of 24 positions shown · non-contrast
Comparison: None.

CLINICAL DATA: Concern for DVT

EXAM:
RIGHT LOWER EXTREMITY VENOUS DOPPLER ULTRASOUND
TECHNIQUE: Gray-scale sonography with compression, as well as color and duplex
ultrasound, were performed to evaluate the deep venous system(s)
from the level of the common femoral vein through the popliteal and
proximal calf veins.

[Series 1: us extrem low venous*right* · 14 of 43 slices shown]
[im 1/43]
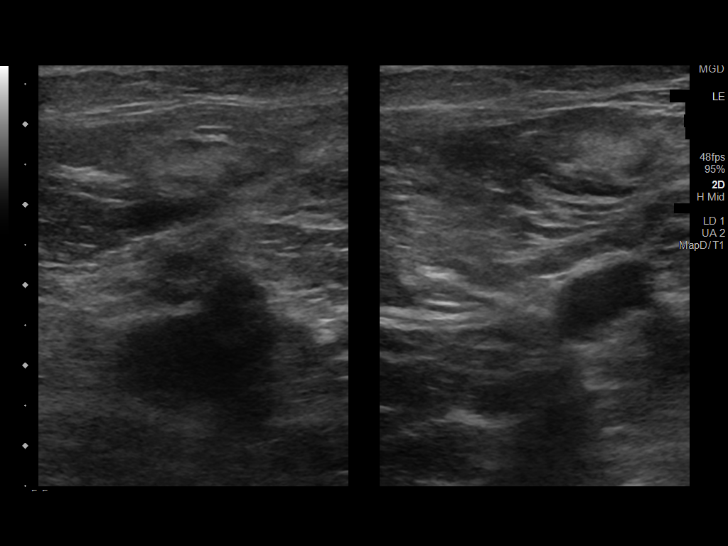
[im 4/43]
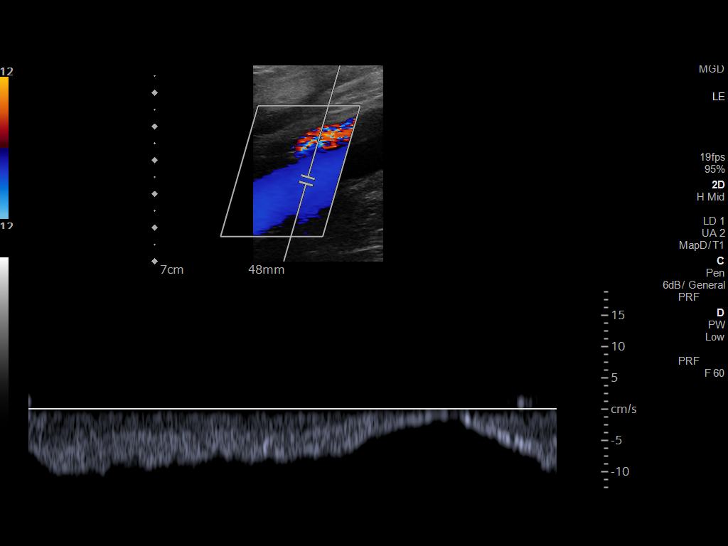
[im 8/43]
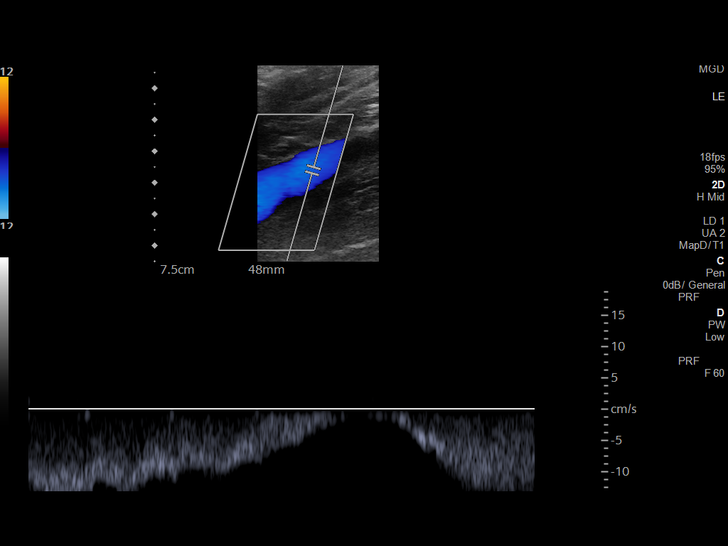
[im 11/43]
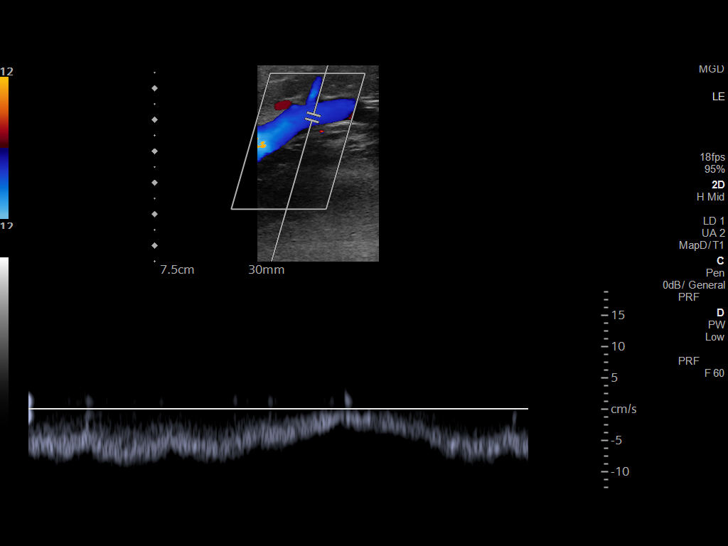
[im 13/43]
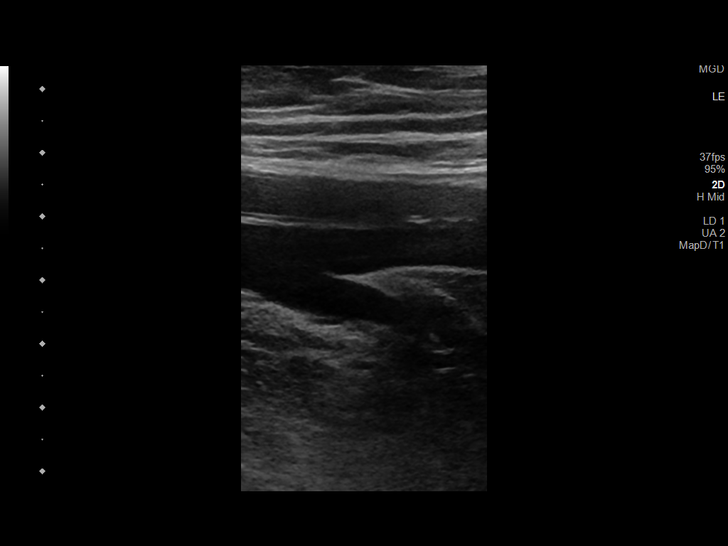
[im 17/43]
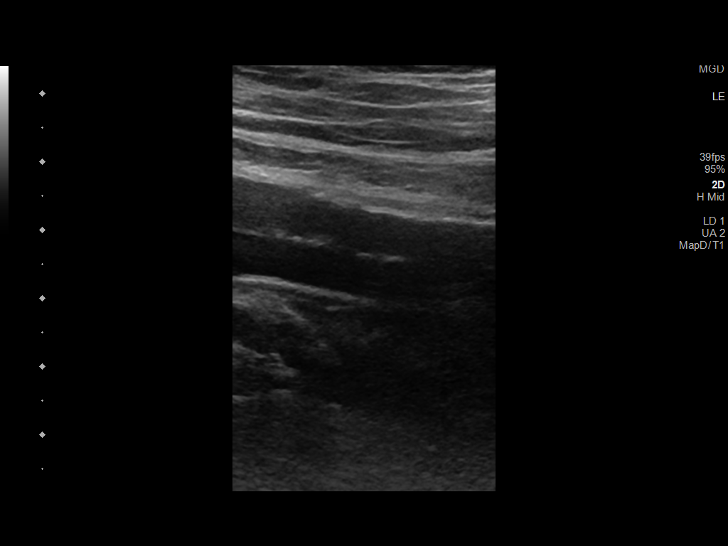
[im 21/43]
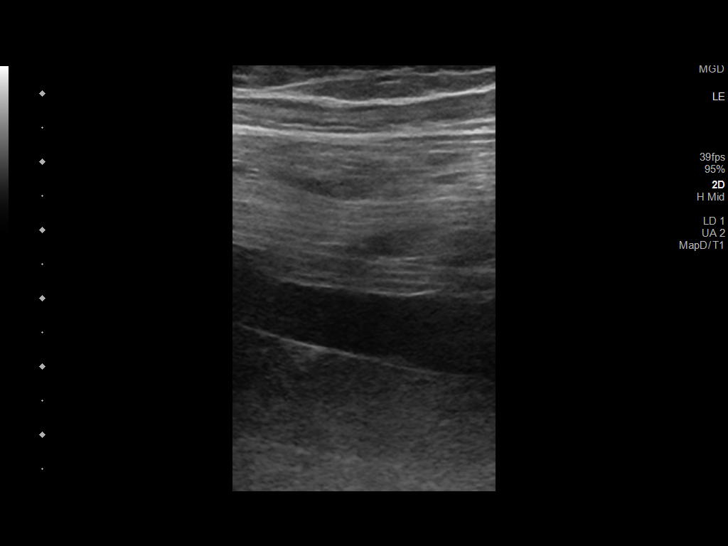
[im 22/43]
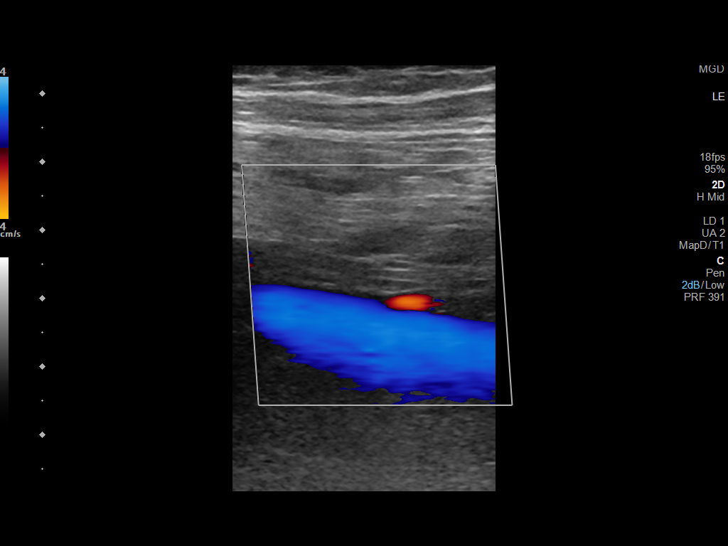
[im 26/43]
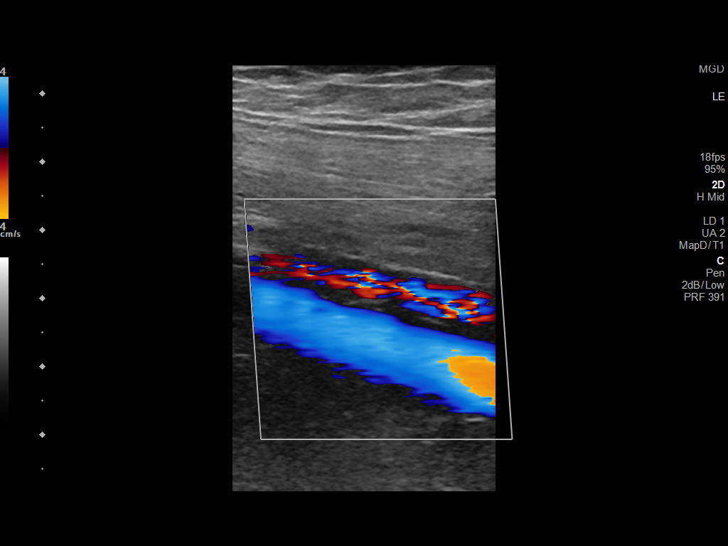
[im 30/43]
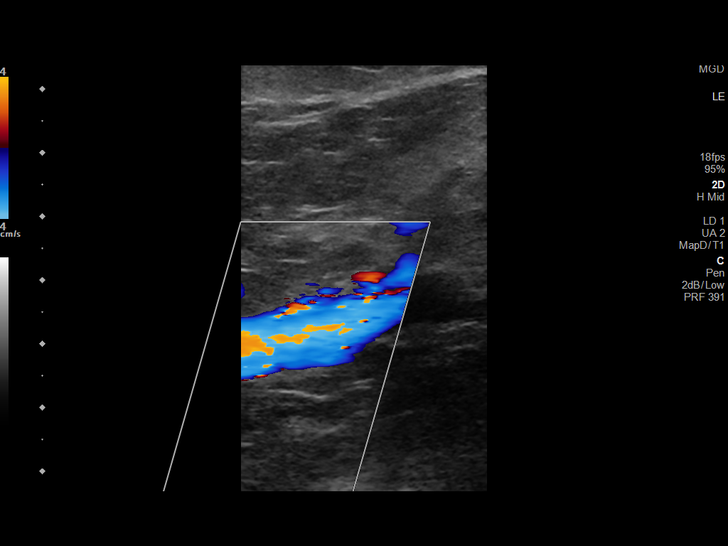
[im 33/43]
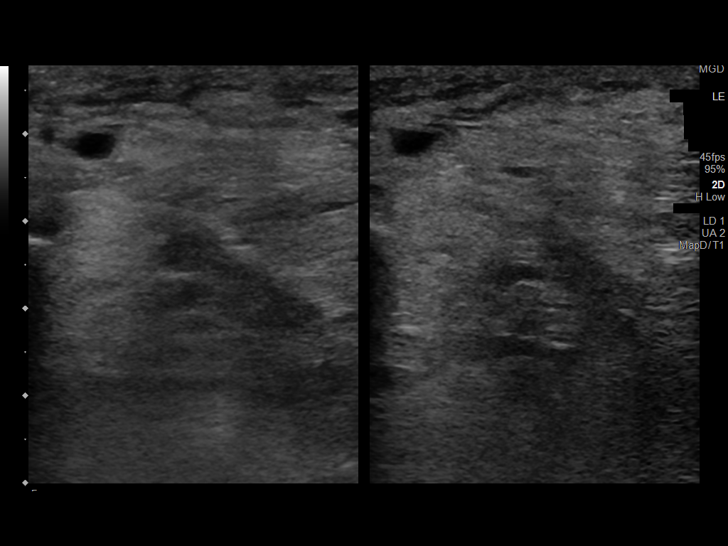
[im 35/43]
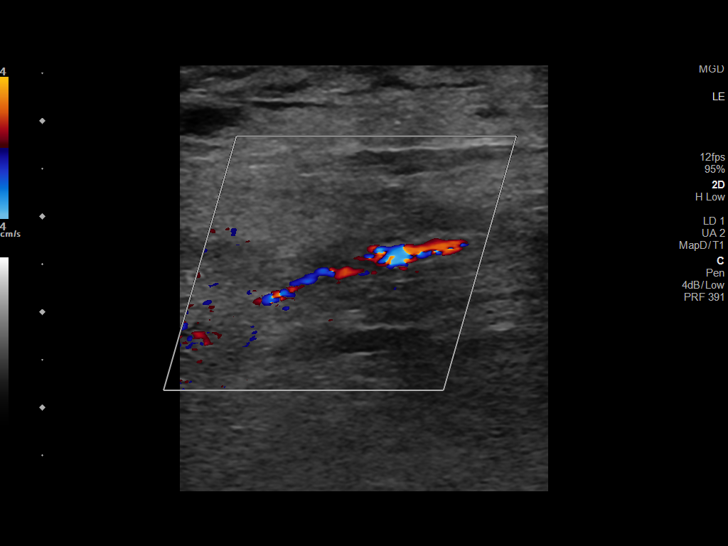
[im 39/43]
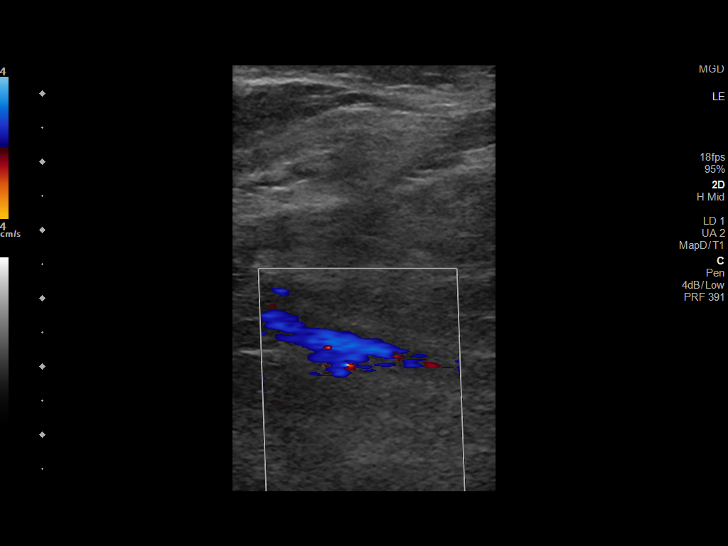
[im 43/43]
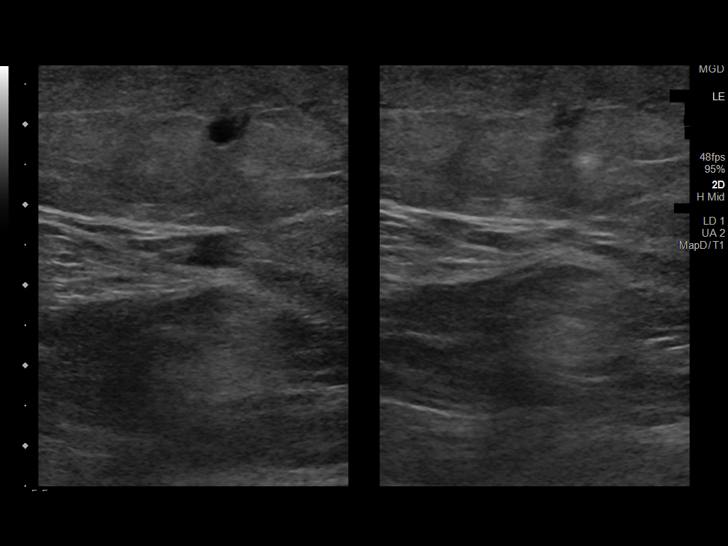

[14 of 24 positions shown; findings below may reference images not displayed]

FINDINGS: VENOUS

Normal compressibility of the right common femoral, superficial
femoral, and popliteal veins, as well as the visualized calf veins.
Visualized portions of profunda femoral vein and great saphenous
vein unremarkable. No filling defects to suggest DVT on grayscale or
color Doppler imaging. Doppler waveforms show normal direction of
venous flow, normal respiratory plasticity and response to
augmentation.

Limited views of the contralateral common femoral vein are
unremarkable.

OTHER

None.

Limitations: none
IMPRESSION: Negative.

## 2022-10-22 DIAGNOSIS — G4733 Obstructive sleep apnea (adult) (pediatric): Secondary | ICD-10-CM | POA: Diagnosis not present

## 2022-11-13 DIAGNOSIS — N529 Male erectile dysfunction, unspecified: Secondary | ICD-10-CM | POA: Diagnosis not present

## 2022-11-13 DIAGNOSIS — M170 Bilateral primary osteoarthritis of knee: Secondary | ICD-10-CM | POA: Diagnosis not present

## 2022-11-13 DIAGNOSIS — I82511 Chronic embolism and thrombosis of right femoral vein: Secondary | ICD-10-CM | POA: Diagnosis not present

## 2022-11-13 DIAGNOSIS — R809 Proteinuria, unspecified: Secondary | ICD-10-CM | POA: Diagnosis not present

## 2022-11-13 DIAGNOSIS — I10 Essential (primary) hypertension: Secondary | ICD-10-CM | POA: Diagnosis not present

## 2022-11-13 DIAGNOSIS — E782 Mixed hyperlipidemia: Secondary | ICD-10-CM | POA: Diagnosis not present

## 2022-11-13 DIAGNOSIS — G4733 Obstructive sleep apnea (adult) (pediatric): Secondary | ICD-10-CM | POA: Diagnosis not present

## 2022-11-13 DIAGNOSIS — E1129 Type 2 diabetes mellitus with other diabetic kidney complication: Secondary | ICD-10-CM | POA: Diagnosis not present

## 2022-11-15 DIAGNOSIS — Z7984 Long term (current) use of oral hypoglycemic drugs: Secondary | ICD-10-CM | POA: Diagnosis not present

## 2022-11-15 DIAGNOSIS — H2513 Age-related nuclear cataract, bilateral: Secondary | ICD-10-CM | POA: Diagnosis not present

## 2022-11-15 DIAGNOSIS — E119 Type 2 diabetes mellitus without complications: Secondary | ICD-10-CM | POA: Diagnosis not present

## 2022-11-15 DIAGNOSIS — H401131 Primary open-angle glaucoma, bilateral, mild stage: Secondary | ICD-10-CM | POA: Diagnosis not present

## 2022-11-15 DIAGNOSIS — H52223 Regular astigmatism, bilateral: Secondary | ICD-10-CM | POA: Diagnosis not present

## 2022-11-15 DIAGNOSIS — H25041 Posterior subcapsular polar age-related cataract, right eye: Secondary | ICD-10-CM | POA: Diagnosis not present

## 2022-11-15 DIAGNOSIS — H43393 Other vitreous opacities, bilateral: Secondary | ICD-10-CM | POA: Diagnosis not present

## 2022-11-15 DIAGNOSIS — H25013 Cortical age-related cataract, bilateral: Secondary | ICD-10-CM | POA: Diagnosis not present

## 2022-11-27 DIAGNOSIS — I10 Essential (primary) hypertension: Secondary | ICD-10-CM | POA: Diagnosis not present

## 2022-11-27 DIAGNOSIS — E1129 Type 2 diabetes mellitus with other diabetic kidney complication: Secondary | ICD-10-CM | POA: Diagnosis not present

## 2022-11-27 DIAGNOSIS — G4733 Obstructive sleep apnea (adult) (pediatric): Secondary | ICD-10-CM | POA: Diagnosis not present

## 2022-11-27 DIAGNOSIS — E782 Mixed hyperlipidemia: Secondary | ICD-10-CM | POA: Diagnosis not present

## 2022-11-27 DIAGNOSIS — N529 Male erectile dysfunction, unspecified: Secondary | ICD-10-CM | POA: Diagnosis not present

## 2022-11-27 DIAGNOSIS — I82511 Chronic embolism and thrombosis of right femoral vein: Secondary | ICD-10-CM | POA: Diagnosis not present

## 2022-11-27 DIAGNOSIS — R809 Proteinuria, unspecified: Secondary | ICD-10-CM | POA: Diagnosis not present

## 2022-11-27 DIAGNOSIS — M170 Bilateral primary osteoarthritis of knee: Secondary | ICD-10-CM | POA: Diagnosis not present

## 2022-12-19 DIAGNOSIS — M170 Bilateral primary osteoarthritis of knee: Secondary | ICD-10-CM | POA: Diagnosis not present

## 2022-12-21 DIAGNOSIS — H52203 Unspecified astigmatism, bilateral: Secondary | ICD-10-CM | POA: Diagnosis not present

## 2022-12-21 DIAGNOSIS — H401131 Primary open-angle glaucoma, bilateral, mild stage: Secondary | ICD-10-CM | POA: Diagnosis not present

## 2022-12-21 DIAGNOSIS — H5213 Myopia, bilateral: Secondary | ICD-10-CM | POA: Diagnosis not present

## 2022-12-21 DIAGNOSIS — E119 Type 2 diabetes mellitus without complications: Secondary | ICD-10-CM | POA: Diagnosis not present

## 2022-12-21 DIAGNOSIS — H25013 Cortical age-related cataract, bilateral: Secondary | ICD-10-CM | POA: Diagnosis not present

## 2022-12-21 DIAGNOSIS — H2513 Age-related nuclear cataract, bilateral: Secondary | ICD-10-CM | POA: Diagnosis not present

## 2022-12-21 DIAGNOSIS — H25041 Posterior subcapsular polar age-related cataract, right eye: Secondary | ICD-10-CM | POA: Diagnosis not present

## 2022-12-21 DIAGNOSIS — H524 Presbyopia: Secondary | ICD-10-CM | POA: Diagnosis not present

## 2023-01-22 DIAGNOSIS — G4733 Obstructive sleep apnea (adult) (pediatric): Secondary | ICD-10-CM | POA: Diagnosis not present

## 2023-02-06 DIAGNOSIS — H2589 Other age-related cataract: Secondary | ICD-10-CM | POA: Diagnosis not present

## 2023-02-06 DIAGNOSIS — H2511 Age-related nuclear cataract, right eye: Secondary | ICD-10-CM | POA: Diagnosis not present

## 2023-02-13 DIAGNOSIS — H25812 Combined forms of age-related cataract, left eye: Secondary | ICD-10-CM | POA: Diagnosis not present

## 2023-02-22 DIAGNOSIS — Z8546 Personal history of malignant neoplasm of prostate: Secondary | ICD-10-CM | POA: Diagnosis not present

## 2023-02-22 DIAGNOSIS — C61 Malignant neoplasm of prostate: Secondary | ICD-10-CM | POA: Diagnosis not present

## 2023-03-01 DIAGNOSIS — N5201 Erectile dysfunction due to arterial insufficiency: Secondary | ICD-10-CM | POA: Diagnosis not present

## 2023-03-01 DIAGNOSIS — R350 Frequency of micturition: Secondary | ICD-10-CM | POA: Diagnosis not present

## 2023-03-07 DIAGNOSIS — H2512 Age-related nuclear cataract, left eye: Secondary | ICD-10-CM | POA: Diagnosis not present

## 2023-03-07 DIAGNOSIS — H25812 Combined forms of age-related cataract, left eye: Secondary | ICD-10-CM | POA: Diagnosis not present

## 2023-03-12 DIAGNOSIS — G4733 Obstructive sleep apnea (adult) (pediatric): Secondary | ICD-10-CM | POA: Diagnosis not present

## 2023-03-12 DIAGNOSIS — N529 Male erectile dysfunction, unspecified: Secondary | ICD-10-CM | POA: Diagnosis not present

## 2023-03-12 DIAGNOSIS — I82511 Chronic embolism and thrombosis of right femoral vein: Secondary | ICD-10-CM | POA: Diagnosis not present

## 2023-03-12 DIAGNOSIS — I10 Essential (primary) hypertension: Secondary | ICD-10-CM | POA: Diagnosis not present

## 2023-03-12 DIAGNOSIS — E782 Mixed hyperlipidemia: Secondary | ICD-10-CM | POA: Diagnosis not present

## 2023-03-12 DIAGNOSIS — E1129 Type 2 diabetes mellitus with other diabetic kidney complication: Secondary | ICD-10-CM | POA: Diagnosis not present

## 2023-03-12 DIAGNOSIS — R809 Proteinuria, unspecified: Secondary | ICD-10-CM | POA: Diagnosis not present

## 2023-03-12 DIAGNOSIS — M170 Bilateral primary osteoarthritis of knee: Secondary | ICD-10-CM | POA: Diagnosis not present

## 2023-03-28 DIAGNOSIS — G4733 Obstructive sleep apnea (adult) (pediatric): Secondary | ICD-10-CM | POA: Diagnosis not present

## 2023-03-28 DIAGNOSIS — R809 Proteinuria, unspecified: Secondary | ICD-10-CM | POA: Diagnosis not present

## 2023-03-28 DIAGNOSIS — N529 Male erectile dysfunction, unspecified: Secondary | ICD-10-CM | POA: Diagnosis not present

## 2023-03-28 DIAGNOSIS — Z0001 Encounter for general adult medical examination with abnormal findings: Secondary | ICD-10-CM | POA: Diagnosis not present

## 2023-03-28 DIAGNOSIS — E1129 Type 2 diabetes mellitus with other diabetic kidney complication: Secondary | ICD-10-CM | POA: Diagnosis not present

## 2023-03-28 DIAGNOSIS — M170 Bilateral primary osteoarthritis of knee: Secondary | ICD-10-CM | POA: Diagnosis not present

## 2023-03-28 DIAGNOSIS — I82511 Chronic embolism and thrombosis of right femoral vein: Secondary | ICD-10-CM | POA: Diagnosis not present

## 2023-03-28 DIAGNOSIS — I10 Essential (primary) hypertension: Secondary | ICD-10-CM | POA: Diagnosis not present

## 2023-03-28 DIAGNOSIS — E782 Mixed hyperlipidemia: Secondary | ICD-10-CM | POA: Diagnosis not present

## 2023-04-23 DIAGNOSIS — G4733 Obstructive sleep apnea (adult) (pediatric): Secondary | ICD-10-CM | POA: Diagnosis not present

## 2023-04-26 DIAGNOSIS — E119 Type 2 diabetes mellitus without complications: Secondary | ICD-10-CM | POA: Diagnosis not present

## 2023-04-26 DIAGNOSIS — I872 Venous insufficiency (chronic) (peripheral): Secondary | ICD-10-CM | POA: Diagnosis not present

## 2023-04-26 DIAGNOSIS — I739 Peripheral vascular disease, unspecified: Secondary | ICD-10-CM | POA: Diagnosis not present

## 2023-05-03 DIAGNOSIS — I739 Peripheral vascular disease, unspecified: Secondary | ICD-10-CM | POA: Diagnosis not present

## 2023-05-03 DIAGNOSIS — Z7901 Long term (current) use of anticoagulants: Secondary | ICD-10-CM | POA: Diagnosis not present

## 2023-05-03 DIAGNOSIS — E119 Type 2 diabetes mellitus without complications: Secondary | ICD-10-CM | POA: Diagnosis not present

## 2023-05-03 DIAGNOSIS — Z87891 Personal history of nicotine dependence: Secondary | ICD-10-CM | POA: Diagnosis not present

## 2023-05-03 DIAGNOSIS — I10 Essential (primary) hypertension: Secondary | ICD-10-CM | POA: Diagnosis not present

## 2023-05-03 DIAGNOSIS — Z7982 Long term (current) use of aspirin: Secondary | ICD-10-CM | POA: Diagnosis not present

## 2023-05-07 DIAGNOSIS — Z1212 Encounter for screening for malignant neoplasm of rectum: Secondary | ICD-10-CM | POA: Diagnosis not present

## 2023-05-07 DIAGNOSIS — Z1211 Encounter for screening for malignant neoplasm of colon: Secondary | ICD-10-CM | POA: Diagnosis not present

## 2023-07-09 DIAGNOSIS — I10 Essential (primary) hypertension: Secondary | ICD-10-CM | POA: Diagnosis not present

## 2023-07-09 DIAGNOSIS — R809 Proteinuria, unspecified: Secondary | ICD-10-CM | POA: Diagnosis not present

## 2023-07-09 DIAGNOSIS — E782 Mixed hyperlipidemia: Secondary | ICD-10-CM | POA: Diagnosis not present

## 2023-07-09 DIAGNOSIS — Z0001 Encounter for general adult medical examination with abnormal findings: Secondary | ICD-10-CM | POA: Diagnosis not present

## 2023-07-09 DIAGNOSIS — G4733 Obstructive sleep apnea (adult) (pediatric): Secondary | ICD-10-CM | POA: Diagnosis not present

## 2023-07-09 DIAGNOSIS — Z125 Encounter for screening for malignant neoplasm of prostate: Secondary | ICD-10-CM | POA: Diagnosis not present

## 2023-07-09 DIAGNOSIS — I82511 Chronic embolism and thrombosis of right femoral vein: Secondary | ICD-10-CM | POA: Diagnosis not present

## 2023-07-09 DIAGNOSIS — N529 Male erectile dysfunction, unspecified: Secondary | ICD-10-CM | POA: Diagnosis not present

## 2023-07-09 DIAGNOSIS — M170 Bilateral primary osteoarthritis of knee: Secondary | ICD-10-CM | POA: Diagnosis not present

## 2023-07-09 DIAGNOSIS — E1129 Type 2 diabetes mellitus with other diabetic kidney complication: Secondary | ICD-10-CM | POA: Diagnosis not present

## 2023-07-17 DIAGNOSIS — M25512 Pain in left shoulder: Secondary | ICD-10-CM | POA: Diagnosis not present

## 2023-07-17 DIAGNOSIS — M170 Bilateral primary osteoarthritis of knee: Secondary | ICD-10-CM | POA: Diagnosis not present

## 2023-07-17 DIAGNOSIS — M25561 Pain in right knee: Secondary | ICD-10-CM | POA: Diagnosis not present

## 2023-07-17 DIAGNOSIS — M25562 Pain in left knee: Secondary | ICD-10-CM | POA: Diagnosis not present

## 2023-07-17 DIAGNOSIS — G8929 Other chronic pain: Secondary | ICD-10-CM | POA: Diagnosis not present

## 2023-07-22 DIAGNOSIS — G4733 Obstructive sleep apnea (adult) (pediatric): Secondary | ICD-10-CM | POA: Diagnosis not present

## 2023-07-25 DIAGNOSIS — Z6841 Body Mass Index (BMI) 40.0 and over, adult: Secondary | ICD-10-CM | POA: Diagnosis not present

## 2023-07-25 DIAGNOSIS — G4733 Obstructive sleep apnea (adult) (pediatric): Secondary | ICD-10-CM | POA: Diagnosis not present

## 2023-07-25 DIAGNOSIS — E66813 Obesity, class 3: Secondary | ICD-10-CM | POA: Diagnosis not present

## 2023-07-27 DIAGNOSIS — M19012 Primary osteoarthritis, left shoulder: Secondary | ICD-10-CM | POA: Diagnosis not present

## 2023-07-27 DIAGNOSIS — G8929 Other chronic pain: Secondary | ICD-10-CM | POA: Diagnosis not present

## 2023-07-27 DIAGNOSIS — M25512 Pain in left shoulder: Secondary | ICD-10-CM | POA: Diagnosis not present

## 2023-08-07 DIAGNOSIS — H401131 Primary open-angle glaucoma, bilateral, mild stage: Secondary | ICD-10-CM | POA: Diagnosis not present

## 2023-08-10 DIAGNOSIS — G4733 Obstructive sleep apnea (adult) (pediatric): Secondary | ICD-10-CM | POA: Diagnosis not present

## 2023-08-30 DIAGNOSIS — Z8546 Personal history of malignant neoplasm of prostate: Secondary | ICD-10-CM | POA: Diagnosis not present

## 2023-08-30 DIAGNOSIS — R311 Benign essential microscopic hematuria: Secondary | ICD-10-CM | POA: Diagnosis not present

## 2023-09-09 DIAGNOSIS — G4733 Obstructive sleep apnea (adult) (pediatric): Secondary | ICD-10-CM | POA: Diagnosis not present

## 2023-09-20 DIAGNOSIS — Z6841 Body Mass Index (BMI) 40.0 and over, adult: Secondary | ICD-10-CM | POA: Diagnosis not present

## 2023-09-20 DIAGNOSIS — G4733 Obstructive sleep apnea (adult) (pediatric): Secondary | ICD-10-CM | POA: Diagnosis not present

## 2023-09-20 DIAGNOSIS — E66813 Obesity, class 3: Secondary | ICD-10-CM | POA: Diagnosis not present

## 2023-10-08 DIAGNOSIS — N529 Male erectile dysfunction, unspecified: Secondary | ICD-10-CM | POA: Diagnosis not present

## 2023-10-08 DIAGNOSIS — I82511 Chronic embolism and thrombosis of right femoral vein: Secondary | ICD-10-CM | POA: Diagnosis not present

## 2023-10-08 DIAGNOSIS — I10 Essential (primary) hypertension: Secondary | ICD-10-CM | POA: Diagnosis not present

## 2023-10-08 DIAGNOSIS — E1129 Type 2 diabetes mellitus with other diabetic kidney complication: Secondary | ICD-10-CM | POA: Diagnosis not present

## 2023-10-08 DIAGNOSIS — G4733 Obstructive sleep apnea (adult) (pediatric): Secondary | ICD-10-CM | POA: Diagnosis not present

## 2023-10-08 DIAGNOSIS — R809 Proteinuria, unspecified: Secondary | ICD-10-CM | POA: Diagnosis not present

## 2023-10-08 DIAGNOSIS — M170 Bilateral primary osteoarthritis of knee: Secondary | ICD-10-CM | POA: Diagnosis not present

## 2023-10-08 DIAGNOSIS — E782 Mixed hyperlipidemia: Secondary | ICD-10-CM | POA: Diagnosis not present

## 2023-10-10 DIAGNOSIS — G4733 Obstructive sleep apnea (adult) (pediatric): Secondary | ICD-10-CM | POA: Diagnosis not present

## 2023-10-29 DIAGNOSIS — G4733 Obstructive sleep apnea (adult) (pediatric): Secondary | ICD-10-CM | POA: Diagnosis not present

## 2023-11-06 DIAGNOSIS — L03032 Cellulitis of left toe: Secondary | ICD-10-CM | POA: Diagnosis not present

## 2023-11-08 DIAGNOSIS — G4733 Obstructive sleep apnea (adult) (pediatric): Secondary | ICD-10-CM | POA: Diagnosis not present

## 2023-11-08 DIAGNOSIS — L03031 Cellulitis of right toe: Secondary | ICD-10-CM | POA: Diagnosis not present

## 2023-11-09 DIAGNOSIS — G4733 Obstructive sleep apnea (adult) (pediatric): Secondary | ICD-10-CM | POA: Diagnosis not present

## 2023-11-15 DIAGNOSIS — L03031 Cellulitis of right toe: Secondary | ICD-10-CM | POA: Diagnosis not present

## 2023-11-22 DIAGNOSIS — E782 Mixed hyperlipidemia: Secondary | ICD-10-CM | POA: Diagnosis not present

## 2023-11-22 DIAGNOSIS — B372 Candidiasis of skin and nail: Secondary | ICD-10-CM | POA: Diagnosis not present

## 2023-11-22 DIAGNOSIS — E1165 Type 2 diabetes mellitus with hyperglycemia: Secondary | ICD-10-CM | POA: Diagnosis not present

## 2023-12-10 DIAGNOSIS — G4733 Obstructive sleep apnea (adult) (pediatric): Secondary | ICD-10-CM | POA: Diagnosis not present

## 2023-12-20 DIAGNOSIS — I82511 Chronic embolism and thrombosis of right femoral vein: Secondary | ICD-10-CM | POA: Diagnosis not present

## 2023-12-20 DIAGNOSIS — R809 Proteinuria, unspecified: Secondary | ICD-10-CM | POA: Diagnosis not present

## 2023-12-20 DIAGNOSIS — N529 Male erectile dysfunction, unspecified: Secondary | ICD-10-CM | POA: Diagnosis not present

## 2023-12-20 DIAGNOSIS — G4733 Obstructive sleep apnea (adult) (pediatric): Secondary | ICD-10-CM | POA: Diagnosis not present

## 2023-12-20 DIAGNOSIS — E782 Mixed hyperlipidemia: Secondary | ICD-10-CM | POA: Diagnosis not present

## 2023-12-20 DIAGNOSIS — E1129 Type 2 diabetes mellitus with other diabetic kidney complication: Secondary | ICD-10-CM | POA: Diagnosis not present

## 2023-12-20 DIAGNOSIS — M170 Bilateral primary osteoarthritis of knee: Secondary | ICD-10-CM | POA: Diagnosis not present

## 2023-12-20 DIAGNOSIS — I10 Essential (primary) hypertension: Secondary | ICD-10-CM | POA: Diagnosis not present

## 2023-12-31 DIAGNOSIS — N5082 Scrotal pain: Secondary | ICD-10-CM | POA: Diagnosis not present

## 2024-01-10 DIAGNOSIS — G4733 Obstructive sleep apnea (adult) (pediatric): Secondary | ICD-10-CM | POA: Diagnosis not present

## 2024-01-22 DIAGNOSIS — M170 Bilateral primary osteoarthritis of knee: Secondary | ICD-10-CM | POA: Diagnosis not present

## 2024-01-25 DIAGNOSIS — M170 Bilateral primary osteoarthritis of knee: Secondary | ICD-10-CM | POA: Diagnosis not present

## 2024-01-31 DIAGNOSIS — M170 Bilateral primary osteoarthritis of knee: Secondary | ICD-10-CM | POA: Diagnosis not present

## 2024-01-31 DIAGNOSIS — E782 Mixed hyperlipidemia: Secondary | ICD-10-CM | POA: Diagnosis not present

## 2024-01-31 DIAGNOSIS — E1129 Type 2 diabetes mellitus with other diabetic kidney complication: Secondary | ICD-10-CM | POA: Diagnosis not present

## 2024-01-31 DIAGNOSIS — N529 Male erectile dysfunction, unspecified: Secondary | ICD-10-CM | POA: Diagnosis not present

## 2024-01-31 DIAGNOSIS — I82511 Chronic embolism and thrombosis of right femoral vein: Secondary | ICD-10-CM | POA: Diagnosis not present

## 2024-01-31 DIAGNOSIS — G4733 Obstructive sleep apnea (adult) (pediatric): Secondary | ICD-10-CM | POA: Diagnosis not present

## 2024-01-31 DIAGNOSIS — R809 Proteinuria, unspecified: Secondary | ICD-10-CM | POA: Diagnosis not present

## 2024-01-31 DIAGNOSIS — I10 Essential (primary) hypertension: Secondary | ICD-10-CM | POA: Diagnosis not present

## 2024-02-06 DIAGNOSIS — G4733 Obstructive sleep apnea (adult) (pediatric): Secondary | ICD-10-CM | POA: Diagnosis not present

## 2024-02-12 DIAGNOSIS — H5203 Hypermetropia, bilateral: Secondary | ICD-10-CM | POA: Diagnosis not present

## 2024-02-12 DIAGNOSIS — H52203 Unspecified astigmatism, bilateral: Secondary | ICD-10-CM | POA: Diagnosis not present

## 2024-02-12 DIAGNOSIS — H524 Presbyopia: Secondary | ICD-10-CM | POA: Diagnosis not present

## 2024-02-12 DIAGNOSIS — H401131 Primary open-angle glaucoma, bilateral, mild stage: Secondary | ICD-10-CM | POA: Diagnosis not present

## 2024-02-12 DIAGNOSIS — E119 Type 2 diabetes mellitus without complications: Secondary | ICD-10-CM | POA: Diagnosis not present

## 2024-02-12 DIAGNOSIS — H43393 Other vitreous opacities, bilateral: Secondary | ICD-10-CM | POA: Diagnosis not present

## 2024-02-12 DIAGNOSIS — H26493 Other secondary cataract, bilateral: Secondary | ICD-10-CM | POA: Diagnosis not present

## 2024-02-12 DIAGNOSIS — Z7984 Long term (current) use of oral hypoglycemic drugs: Secondary | ICD-10-CM | POA: Diagnosis not present

## 2024-02-28 DIAGNOSIS — R399 Unspecified symptoms and signs involving the genitourinary system: Secondary | ICD-10-CM | POA: Diagnosis not present

## 2024-02-28 DIAGNOSIS — L723 Sebaceous cyst: Secondary | ICD-10-CM | POA: Diagnosis not present

## 2024-02-28 DIAGNOSIS — C61 Malignant neoplasm of prostate: Secondary | ICD-10-CM | POA: Diagnosis not present

## 2024-02-28 DIAGNOSIS — Z8546 Personal history of malignant neoplasm of prostate: Secondary | ICD-10-CM | POA: Diagnosis not present

## 2024-03-11 DIAGNOSIS — G4733 Obstructive sleep apnea (adult) (pediatric): Secondary | ICD-10-CM | POA: Diagnosis not present

## 2024-03-13 DIAGNOSIS — N181 Chronic kidney disease, stage 1: Secondary | ICD-10-CM | POA: Diagnosis not present

## 2024-03-13 DIAGNOSIS — I82511 Chronic embolism and thrombosis of right femoral vein: Secondary | ICD-10-CM | POA: Diagnosis not present

## 2024-03-13 DIAGNOSIS — I129 Hypertensive chronic kidney disease with stage 1 through stage 4 chronic kidney disease, or unspecified chronic kidney disease: Secondary | ICD-10-CM | POA: Diagnosis not present

## 2024-03-13 DIAGNOSIS — E1129 Type 2 diabetes mellitus with other diabetic kidney complication: Secondary | ICD-10-CM | POA: Diagnosis not present

## 2024-03-13 DIAGNOSIS — N529 Male erectile dysfunction, unspecified: Secondary | ICD-10-CM | POA: Diagnosis not present

## 2024-03-13 DIAGNOSIS — R809 Proteinuria, unspecified: Secondary | ICD-10-CM | POA: Diagnosis not present

## 2024-03-13 DIAGNOSIS — G4733 Obstructive sleep apnea (adult) (pediatric): Secondary | ICD-10-CM | POA: Diagnosis not present

## 2024-03-13 DIAGNOSIS — E782 Mixed hyperlipidemia: Secondary | ICD-10-CM | POA: Diagnosis not present

## 2024-03-13 DIAGNOSIS — M170 Bilateral primary osteoarthritis of knee: Secondary | ICD-10-CM | POA: Diagnosis not present

## 2024-03-13 DIAGNOSIS — I10 Essential (primary) hypertension: Secondary | ICD-10-CM | POA: Diagnosis not present

## 2024-03-27 DIAGNOSIS — M25562 Pain in left knee: Secondary | ICD-10-CM | POA: Diagnosis not present

## 2024-03-27 DIAGNOSIS — M1712 Unilateral primary osteoarthritis, left knee: Secondary | ICD-10-CM | POA: Diagnosis not present

## 2024-03-27 DIAGNOSIS — M1711 Unilateral primary osteoarthritis, right knee: Secondary | ICD-10-CM | POA: Diagnosis not present

## 2024-03-27 DIAGNOSIS — M25561 Pain in right knee: Secondary | ICD-10-CM | POA: Diagnosis not present
